# Patient Record
Sex: Female | Born: 1990 | Hispanic: Yes | Marital: Single | Smoking: Never smoker
Health system: Southern US, Community
[De-identification: ages and names within clinical notes are randomized; demographics above are authoritative.]

## PROBLEM LIST (undated history)

## (undated) ENCOUNTER — Inpatient Hospital Stay (HOSPITAL_COMMUNITY): Payer: Self-pay

## (undated) DIAGNOSIS — R7303 Prediabetes: Secondary | ICD-10-CM

## (undated) DIAGNOSIS — I471 Supraventricular tachycardia, unspecified: Secondary | ICD-10-CM

## (undated) DIAGNOSIS — I499 Cardiac arrhythmia, unspecified: Secondary | ICD-10-CM

## (undated) DIAGNOSIS — O139 Gestational [pregnancy-induced] hypertension without significant proteinuria, unspecified trimester: Secondary | ICD-10-CM

## (undated) HISTORY — DX: Prediabetes: R73.03

## (undated) HISTORY — PX: TONSILLECTOMY: SUR1361

## (undated) HISTORY — DX: Gestational (pregnancy-induced) hypertension without significant proteinuria, unspecified trimester: O13.9

---
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
## 2013-07-26 ENCOUNTER — Encounter: Payer: Self-pay | Admitting: *Deleted

## 2013-07-31 ENCOUNTER — Encounter (HOSPITAL_COMMUNITY): Payer: Self-pay | Admitting: *Deleted

## 2013-07-31 ENCOUNTER — Inpatient Hospital Stay (HOSPITAL_COMMUNITY)
Admission: AD | Admit: 2013-07-31 | Discharge: 2013-07-31 | Disposition: A | Discharge status: Home / Self Care | Payer: Medicaid Other | Source: Ambulatory Visit | Attending: Obstetrics & Gynecology | Admitting: Obstetrics & Gynecology

## 2013-07-31 ENCOUNTER — Inpatient Hospital Stay (HOSPITAL_COMMUNITY): Payer: Medicaid Other

## 2013-07-31 DIAGNOSIS — O34219 Maternal care for unspecified type scar from previous cesarean delivery: Secondary | ICD-10-CM | POA: Insufficient documentation

## 2013-07-31 DIAGNOSIS — O26899 Other specified pregnancy related conditions, unspecified trimester: Secondary | ICD-10-CM

## 2013-07-31 DIAGNOSIS — O99891 Other specified diseases and conditions complicating pregnancy: Secondary | ICD-10-CM | POA: Insufficient documentation

## 2013-07-31 DIAGNOSIS — R109 Unspecified abdominal pain: Secondary | ICD-10-CM | POA: Insufficient documentation

## 2013-07-31 DIAGNOSIS — O9989 Other specified diseases and conditions complicating pregnancy, childbirth and the puerperium: Secondary | ICD-10-CM

## 2013-07-31 LAB — URINALYSIS, ROUTINE W REFLEX MICROSCOPIC
BILIRUBIN URINE: NEGATIVE
GLUCOSE, UA: NEGATIVE mg/dL
Hgb urine dipstick: NEGATIVE
KETONES UR: NEGATIVE mg/dL
Nitrite: NEGATIVE
PH: 6 (ref 5.0–8.0)
Protein, ur: NEGATIVE mg/dL
Urobilinogen, UA: 0.2 mg/dL (ref 0.0–1.0)

## 2013-07-31 LAB — WET PREP, GENITAL
CLUE CELLS WET PREP: NONE SEEN
TRICH WET PREP: NONE SEEN
Yeast Wet Prep HPF POC: NONE SEEN

## 2013-07-31 LAB — URINE MICROSCOPIC-ADD ON

## 2013-07-31 MED ORDER — CYCLOBENZAPRINE HCL 5 MG PO TABS: 5.0000 mg | ORAL_TABLET | Freq: Three times a day (TID) | ORAL | Status: DC | PRN

## 2013-07-31 MED ORDER — CYCLOBENZAPRINE HCL 5 MG PO TABS
5.0000 mg | ORAL_TABLET | Freq: Once | ORAL | Status: AC
  Administered 2013-07-31: 5 mg via ORAL
  Filled 2013-07-31: qty 1

## 2013-07-31 NOTE — MAU Provider Note (Signed)
History     CSN: [id number]  Arrival date and time: 07/31/13 1157   First Provider Initiated Contact with Patient 07/31/13 1220      Chief Complaint  Patient presents with  . Abdominal Pain   HPI Ms. Veronica Boyle is a 23 y.o. G2P1001 at 6098w3d who presents to MAU today with lower abdominal pain since late last night. She has had some nausea today without vomiting. She is having a clear thin discharge since 10:00am today. She has not yet been seen for this pregnancy or had an US. She rates her pain at 7/10 now. She states that when the pain started it was coming and going but now it is constant. She had a previous C/S for failure to progress. She denies vaginal bleeding or fever today.    OB History   Grav Para Term Preterm Abortions TAB SAB Ect Mult Living   2 1 1  0 0 0 0 0 0 1      Past Medical History  Diagnosis Date  . Arrhythmia   . SVT (supraventricular tachycardia)     History reviewed. No pertinent past surgical history.  History reviewed. No pertinent family history.  History  Substance Use Topics  . Smoking status: Never Smoker   . Smokeless tobacco: Never Used  . Alcohol Use: No    Allergies:  Allergies  Allergen Reactions  . Vancomycin     redman syndrome    No prescriptions prior to admission    Review of Systems  Constitutional: Negative for fever and malaise/fatigue.  Gastrointestinal: Positive for nausea and abdominal pain. Negative for vomiting, diarrhea and constipation.  Genitourinary: Negative for dysuria, urgency and frequency.       Neg - vaginal bleeding + discharge  Neurological: Positive for weakness.   Physical Exam   Blood pressure 136/68, pulse 95, temperature 98.8 F (37.1 C), temperature source Oral, resp. rate 18, height 5\' 6"  (1.676 m), weight 108.863 kg (240 lb), last menstrual period 05/05/2013.  Physical Exam  Constitutional: She is oriented to person, place, and time. She appears well-developed and  well-nourished. No distress.  HENT:  Head: Normocephalic and atraumatic.  Cardiovascular: Normal rate.   Respiratory: Effort normal.  GI: Soft. She exhibits no distension and no mass. There is tenderness (moderate tenderness to palpation of the lower abdomen). There is no rebound and no guarding.  Neurological: She is alert and oriented to person, place, and time.  Skin: Skin is warm and dry. No erythema.  Psychiatric: She has a normal mood and affect.   Results for orders placed during the hospital encounter of 07/31/13 (from the past 24 hour(s))  URINALYSIS, ROUTINE W REFLEX MICROSCOPIC     Status: Abnormal   Collection Time    07/31/13 12:12 PM      Result Value Ref Range   Color, Urine YELLOW  YELLOW   APPearance HAZY (*) CLEAR   Specific Gravity, Urine >1.030 (*) 1.005 - 1.030   pH 6.0  5.0 - 8.0   Glucose, UA NEGATIVE  NEGATIVE mg/dL   Hgb urine dipstick NEGATIVE  NEGATIVE   Bilirubin Urine NEGATIVE  NEGATIVE   Ketones, ur NEGATIVE  NEGATIVE mg/dL   Protein, ur NEGATIVE  NEGATIVE mg/dL   Urobilinogen, UA 0.2  0.0 - 1.0 mg/dL   Nitrite NEGATIVE  NEGATIVE   Leukocytes, UA SMALL (*) NEGATIVE  URINE MICROSCOPIC-ADD ON     Status: Abnormal   Collection Time    07/31/13 12:12 PM  Result Value Ref Range   Squamous Epithelial / LPF MANY (*) RARE   WBC, UA 7-10  <3 WBC/hpf   RBC / HPF 0-2  <3 RBC/hpf   Bacteria, UA MANY (*) RARE   Urine-Other MUCOUS PRESENT    WET PREP, GENITAL     Status: Abnormal   Collection Time    07/31/13  2:00 PM      Result Value Ref Range   Yeast Wet Prep HPF POC NONE SEEN  NONE SEEN   Trich, Wet Prep NONE SEEN  NONE SEEN   Clue Cells Wet Prep HPF POC NONE SEEN  NONE SEEN   WBC, Wet Prep HPF POC MODERATE (*) NONE SEEN   [place] Ob Comp Less 14 Wks  07/31/2013   CLINICAL DATA:  First trimester pregnancy with low abdominal pain. LMP 05/05/2013.  EXAM: OBSTETRIC <14 WK ULTRASOUND  TECHNIQUE: Transabdominal ultrasound was performed for evaluation of the  gestation as well as the maternal uterus and adnexal regions.  COMPARISON:  None.  FINDINGS: Intrauterine gestational sac: Visualized/normal in shape.  Yolk sac:  Not clearly seen.  Embryo:  Visualized.  Cardiac Activity: Visualized.  Heart Rate: 185 bpm  CRL:   55.9     12 w 1 d                  US EDC: 02/11/2014  Maternal uterus/adnexae: Both maternal ovaries are visualized and appear normal. There is no subchorionic hematoma or free pelvic fluid.  IMPRESSION: 1. Single live intrauterine gestation with best estimated gestational age of [redacted] weeks 1 day. 2. No adnexal or uterine abnormalities identified.   Electronically Signed   By: Roxy HorsemanBill  Veazey M.D.   On: 07/31/2013 13:19    MAU Course  Procedures None  MDM Unable to obtain FHT with doppler Declines pain medication at this time US today Flexeril given in MAU today Assessment and Plan  A: SIUP at 2451w3d with normal cardiac activity Abdominal pain in pregnancy, antepartum  P: Discharge home Rx for Flexeril sent to patient's pharmacy First trimester warning signs discussed Urine culture pending Advised use of abdominal binder PRN pain  Patient encouraged to keep appointment with [facility] to start prenatal care on 08/16/13 Patient may return to MAU as needed or if her condition were to change or worsen  Freddi StarrJulie N Ethier, PA-C  07/31/2013, 3:08 PM

## 2013-07-31 NOTE — MAU Note (Signed)
Pt presents with complaints of lower abdominal pain that started last night. Denies any vaginal or abnormal discharge

## 2013-07-31 NOTE — MAU Provider Note (Signed)
Attestation of Attending Supervision of Advanced Practitioner (CNM/NP): Evaluation and management procedures were performed by the Advanced Practitioner under my supervision and collaboration. I have reviewed the Advanced Practitioner's note and chart, and I agree with the management and plan.  Fredrich RomansKelly H Timmya Blazier 4:04 PM

## 2013-07-31 NOTE — Discharge Instructions (Signed)
Abdominal Pain During Pregnancy  Belly (abdominal) pain is common during pregnancy. Most of the time, it is not a serious problem. Other times, it can be a sign that something is wrong with the pregnancy. Always tell your doctor if you have belly pain.  HOME CARE  Monitor your belly pain for any changes. The following actions may help you feel better:  · Do not have sex (intercourse) or put anything in your vagina until you feel better.  · Rest until your pain stops.  · Drink clear fluids if you feel sick to your stomach (nauseous). Do not eat solid food until you feel better.  · Only take medicine as told by your doctor.  · Keep all doctor visits as told.  GET HELP RIGHT AWAY IF:   · You are bleeding, leaking fluid, or pieces of tissue come out of your vagina.  · You have more pain or cramping.  · You keep throwing up (vomiting).  · You have pain when you pee (urinate) or have blood in your pee.  · You have a fever.  · You do not feel your baby moving as much.  · You feel very weak or feel like passing out.  · You have trouble breathing, with or without belly pain.  · You have a very bad headache and belly pain.  · You have fluid leaking from your vagina and belly pain.  · You keep having watery poop (diarrhea).  · Your belly pain does not go away after resting, or the pain gets worse.  MAKE SURE YOU:   · Understand these instructions.  · Will watch your condition.  · Will get help right away if you are not doing well or get worse.  Document Released: 03/18/2009 Document Revised: 11/30/2012 Document Reviewed: 10/27/2012  Exit[employer]® Patient Information ©2014 [employer].

## 2013-08-01 LAB — CULTURE, OB URINE
COLONY COUNT: NO GROWTH
Culture: NO GROWTH

## 2013-08-01 LAB — GC/CHLAMYDIA PROBE AMP
CT Probe RNA: NEGATIVE
GC PROBE AMP APTIMA: NEGATIVE

## 2013-08-16 ENCOUNTER — Encounter: Payer: Self-pay | Admitting: Advanced Practice Midwife

## 2013-08-16 ENCOUNTER — Telehealth: Payer: Self-pay | Admitting: *Deleted

## 2013-08-16 ENCOUNTER — Ambulatory Visit (INDEPENDENT_AMBULATORY_CARE_PROVIDER_SITE_OTHER): Payer: Self-pay | Admitting: Advanced Practice Midwife

## 2013-08-16 VITALS — BP 112/73 | HR 98 | Temp 98.3°F | Wt 250.0 lb

## 2013-08-16 DIAGNOSIS — O09299 Supervision of pregnancy with other poor reproductive or obstetric history, unspecified trimester: Secondary | ICD-10-CM

## 2013-08-16 DIAGNOSIS — E669 Obesity, unspecified: Secondary | ICD-10-CM

## 2013-08-16 DIAGNOSIS — O34219 Maternal care for unspecified type scar from previous cesarean delivery: Secondary | ICD-10-CM

## 2013-08-16 DIAGNOSIS — IMO0002 Reserved for concepts with insufficient information to code with codable children: Secondary | ICD-10-CM

## 2013-08-16 DIAGNOSIS — O99210 Obesity complicating pregnancy, unspecified trimester: Secondary | ICD-10-CM

## 2013-08-16 HISTORY — DX: Supervision of pregnancy with other poor reproductive or obstetric history, unspecified trimester: O09.299

## 2013-08-16 LAB — POCT URINALYSIS DIP (DEVICE)
GLUCOSE, UA: NEGATIVE mg/dL
Ketones, ur: 15 mg/dL — AB
NITRITE: NEGATIVE
PROTEIN: NEGATIVE mg/dL
SPECIFIC GRAVITY, URINE: 1.025 (ref 1.005–1.030)
UROBILINOGEN UA: 0.2 mg/dL (ref 0.0–1.0)
pH: 7 (ref 5.0–8.0)

## 2013-08-16 NOTE — Progress Notes (Signed)
Will get MFM/Genetic counseling consult due to hx fetal abnormalities with first baby.

## 2013-08-16 NOTE — Patient Instructions (Signed)
Vaginal Birth After Cesarean Delivery Vaginal birth after cesarean delivery (VBAC) is giving birth vaginally after previously delivering a baby by a cesarean. In the past, if a woman had a cesarean delivery, all births afterwards would be done by cesarean delivery. This is no longer true. It can be safe for the mother to try a vaginal delivery after having a cesarean delivery.  It is important to discuss VBAC with your health care provider early in the pregnancy so you can understand the risks, benefits, and options. It will give you time to decide what is best in your particular case. The final decision about whether to have a VBAC or repeat cesarean delivery should be between you and your health care provider. Any changes in your health or your baby's health during your pregnancy may make it necessary to change your initial decision about VBAC.  WOMEN WHO PLAN TO HAVE A VBAC SHOULD CHECK WITH THEIR HEALTH CARE PROVIDER TO BE SURE THAT:  The previous cesarean delivery was done with a low transverse uterine cut (incision) (not a vertical classical incision).   The birth canal is big enough for the baby.   There were no other operations on the uterus.   An electronic fetal monitor (EFM) will be on at all times during labor.   An operating room will be available and ready in case an emergency cesarean delivery is needed.   A health care provider and surgical nursing staff will be available at all times during labor to be ready to do an emergency delivery cesarean if necessary.   An anesthesiologist will be present in case an emergency cesarean delivery is needed.   The nursery is prepared and has adequate personnel and necessary equipment available to care for the baby in case of an emergency cesarean delivery. BENEFITS OF VBAC  Shorter stay in the hospital.   Avoidance of risks associated with cesarean delivery, such as:  Surgical complications, such as opening of the incision or  hernia in the incision.  Injury to other organs.  Fever. This can occur if an infection develops after surgery. It can also occur as a reaction to the medicine given to make you numb during the surgery.  Less blood loss and need for blood transfusions.  Lower risk of blood clots and infection.  Shorter recovery.   Decreased risk for having to remove the uterus (hysterectomy).   Decreased risk for the placenta to completely or partially cover the opening of the uterus (placenta previa) with a future pregnancy.   Decrease risk in future labor and delivery. RISKS OF A VBAC  Tearing (rupture) of the uterus. This is occurs in less than 1% of VBACs. The risk of this happening is higher if:  Steps are taken to begin the labor process (induce labor) or stimulate or strengthen contractions (augment labor).   Medicine is used to soften (ripen) the cervix.  Having to remove the uterus (hysterectomy) if it ruptures. VBAC SHOULD NOT BE DONE IF:  The previous cesarean delivery was done with a vertical (classical) or T-shaped incision or you do not know what kind of incision was made.   You had a ruptured uterus.   You have had certain types of surgery on your uterus, such as removal of uterine fibroids. Ask your health care provider about other types of surgeries that prevent you from having a VBAC.  You have certain medical or childbirth (obstetrical) problems.   There are problems with the baby.   You   have had two previous cesarean deliveries and no vaginal deliveries. OTHER FACTS TO KNOW ABOUT VBAC:  It is safe to have an epidural anesthetic with VBAC.   It is safe to turn the baby from a breech position (attempt an external cephalic version).   It is safe to try a VBAC with twins.   VBAC may not be successful if your baby weights 8.8 lb (4 kg) or more. However, weight predictions are not always accurate and should not be used alone to decide if VBAC is right for  you.  There is an increased failure rate if the time between the cesarean delivery and VBAC is less than 19 months.   Your health care provider may advise against a VBAC if you have preeclampsia (high blood pressure, protein in the urine, and swelling of face and extremities).   VBAC is often successful if you previously gave birth vaginally.   VBAC is often successful when the labor starts spontaneously before the due date.   Delivering a baby through a VBAC is similar to having a normal spontaneous vaginal delivery. Document Released: 09/20/2006 Document Revised: 01/18/2013 Document Reviewed: 10/27/2012 Exit[employer] Patient Information 2014 [employer].  

## 2013-08-16 NOTE — Progress Notes (Signed)
C/o of difficulty sleeping.  OB panel and pap today. Early 1hr gtt today; labs to be drawn at 1052.  New OB packet given.  Discussed appropriate weight gain (11-20lb); pt. Verbalized understanding.

## 2013-08-16 NOTE — Telephone Encounter (Signed)
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             Forgot to do it with her new ob today.            Marie ------

## 2013-08-16 NOTE — Addendum Note (Signed)
Addended by: Aviva SignsWILLIAMS, Jamare Vanatta L on: 08/16/2013 12:42 PM   Modules accepted: Orders

## 2013-08-16 NOTE — Progress Notes (Signed)
New OB. See smartset   Subjective:    Veronica Boyle is a G2P1001 2172w5d being seen today for her first obstetrical visit.  Her obstetrical history is significant for obesity. Patient does intend to breast feed. Pregnancy history fully reviewed.  Patient reports no complaints.  Filed Vitals:   08/16/13 0949  BP: 112/73  Pulse: 98  Temp: 98.3 F (36.8 C)  Weight: 250 lb (113.399 kg)    HISTORY: OB History  Gravida Para Term Preterm AB SAB TAB Ectopic Multiple Living  2 1 1  0 0 0 0 0 0 1    # Outcome Date GA Lbr Len/2nd Weight Sex Delivery Anes PTL Lv  2 CUR           1 TRM 10/11/11 5039w0d  6 lb 6 oz (2.892 kg) F LTCS Spinal N Y     Past Medical History  Diagnosis Date  . Arrhythmia   . SVT (supraventricular tachycardia)    Past Surgical History  Procedure Laterality Date  . Cesarean section    . Tonsillectomy     Family History  Problem Relation Age of Onset  . Hyperlipidemia Mother   . Hypertension Mother   . Asthma Father   . Asthma Sister   . Asthma Brother      Exam    Uterus:  Fundal Height: 14 cm  Pelvic Exam:    Perineum: No Hemorrhoids, Normal Perineum   Vulva: Bartholin's, Urethra, Skene's normal   Vagina:  normal mucosa, normal discharge   pH:    Cervix: no bleeding following Pap and no cervical motion tenderness   Adnexa: no mass, fullness, tenderness   Bony Pelvis: gynecoid  System: Breast:  normal appearance, no masses or tenderness   Skin: normal coloration and turgor, no rashes    Neurologic: oriented, grossly non-focal   Extremities: normal strength, tone, and muscle mass   HEENT neck supple with midline trachea   Mouth/Teeth mucous membranes moist, pharynx normal without lesions   Neck supple and no masses   Cardiovascular: regular rate and rhythm, no murmurs or gallops   Respiratory:  appears well, vitals normal, no respiratory distress, acyanotic, normal RR, ear and throat exam is normal, neck free of mass or lymphadenopathy,  chest clear, no wheezing, crepitations, rhonchi, normal symmetric air entry   Abdomen: soft, non-tender; bowel sounds normal; no masses,  no organomegaly   Urinary: urethral meatus normal      Assessment:    Pregnancy: G2P1001 Patient Active Problem List   Diagnosis Date Noted  . Obesity complicating pregnancy, childbirth, or puerperium, antepartum 08/16/2013        Plan:     Initial labs drawn. Prenatal vitamins. Problem list reviewed and updated. Genetic Screening discussed Quad Screen: declined.  Ultrasound discussed; fetal survey: requested.  Follow up in 4 weeks. 50% of 30 min visit spent on counseling and coordination of care.   Pap done   Aviva SignsMarie L Evangela Heffler 08/16/2013

## 2013-08-16 NOTE — Telephone Encounter (Signed)
Appointment made for MFM 5/14 @ 1300.  Contacted patient and informed of Genetic Counseling appt.  [place] was having to come on Tuesday to do 1 hour lab. Informed patient we could do both in same day. Pt will come 5/14 @ 1045 for 1 hour glucose and then 100 for Genetic counseling.  Will send note to front desk..Marland Kitchen

## 2013-08-22 ENCOUNTER — Other Ambulatory Visit: Payer: Self-pay

## 2013-08-24 ENCOUNTER — Other Ambulatory Visit: Payer: Medicaid Other

## 2013-08-24 ENCOUNTER — Other Ambulatory Visit: Payer: Self-pay

## 2013-08-24 ENCOUNTER — Ambulatory Visit (HOSPITAL_COMMUNITY)
Admission: RE | Admit: 2013-08-24 | Discharge: 2013-08-24 | Disposition: A | Discharge status: Home / Self Care | Payer: Self-pay | Source: Ambulatory Visit | Attending: Advanced Practice Midwife | Admitting: Advanced Practice Midwife

## 2013-08-24 ENCOUNTER — Encounter (HOSPITAL_COMMUNITY): Payer: Self-pay

## 2013-08-24 DIAGNOSIS — Z3480 Encounter for supervision of other normal pregnancy, unspecified trimester: Secondary | ICD-10-CM

## 2013-08-24 NOTE — Progress Notes (Signed)
Genetic Counseling  High-Risk Gestation Note  Appointment Date:  08/24/2013 Referred By: Seabron Spates, CNM Date of Birth:  Apr 10, 1991  Pregnancy History: G2P1001 Estimated Date of Delivery: 02/09/14 Estimated Gestational Age: 33w6dAttending: JJolyn Lent MD   I met with Ms. Veronica Generalfor genetic counseling because of a previous child with congenital renal anomaly.   We began by reviewing the family history in detail. Ms. DRauthreported that her daughter, Veronica Boyle who has a different father from the current pregnancy, was born with a kidney difference. 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 She was last seen in April and has a follow-up appointment in 9 months. We discussed that this describes the presence of two right ureters connecting the right kidney to the bladder.   We discussed that a duplicated collecting system, or duplex kidney, describes the presence of two ureters connecting the kidney to the bladder. It is estimated to occur in approximately 1% of the population and is more common in females versus males. It can occur unilaterally or bilaterally. The majority of cases are asymptomatic but can result in symptomatic obstruction, reflux and infection. We discussed that the majority of cases are sporadic. However, congenital anomalies of the kidney and urinary tract can occur as part of an underlying syndrome. Rarely, isolated cases have been associated with familial occurrence possibly fitting with autosomal dominant or autosomal  recessive inheritance. Given the reported family history, recurrence risk for the current pregnancy is likely low, but would be increased above the Boyle population risk. We discussed the option of detailed ultrasound at approximately 18-[redacted] weeks gestation to assess fetal anatomy, specifically kidneys and urinary tract. She understands that genetic testing or screening would not be informative regarding this history, given that no genetic etiology has been identified for her daughter. Targeted [facility] in [facility] for [facility]13/13.  She understands that ultrasound cannot rule out all birth defects or genetic syndromes.  The family histories were otherwise found to be contributory for a nephew to the father of the pregnancy (his sister's son) with hemophilia. This individual is reportedly 21years old and has a history of bleeding and bruising easily. He reportedly requires specific medicine for treatment. Additional information about this individual was limited, [place] The father of the pregnancy reportedly does not have easy bruising or other symptoms of a bleeding disorder. Hemophilia is a genetic condition characterized by either a deficiency of the Factor VIII protein (Hemophilia A) or deficiency of Factor IX (Hemophilia B).  Factor VIII and Factor IX proteins allow the blood to clot normally.  If one of these proteins does not work as it should (caused by changes in the Factor VIII or Factor IX genes on the X chromosome), this will result in excessive and prolonged bleeding and easy bruising.  Hemophilia can be treated through injections of clotting factors as needed or to prevent bleeds from occurring, but there is no cure at this time. We reviewed that both types of hemophilia follow X-linked recessive inheritance. A female "carrier" refers to a woman with  one working copy and one nonworking copy of their gene for hemophilia.  Carriers may  have no symptoms or mild symptoms of the condition.  Each time a carrier female is pregnant, the pregnancy has a new chance for each of the following outcomes: (1) a normal female, (2) a carrier female like the mother with no symptoms to mild symptoms, (3) an unaffected female (not a carrier), or (4) a female with hemophilia. Males with hemophilia would be expected to have carrier daughters and unaffected sons. We discussed that given the reported family history and that the father of the pregnancy is reportedly unaffected, the current pregnancy would not be at increased risk for hemophilia given X-linked inheritance. In the case of a different bleeding disorder in the family that follows a different pattern of inheritance or additional symptomatic relatives, recurrence risk estimate may change.   Veronica Boyle also reported a maternal great-aunt (her maternal grandmother's sister) with a mind "like a child." She reportedly has "a lot of anger" and pulled out all of her teeth. It was unclear if the patient's relative has intellectual disability, mental illness, or both. The patient stated that there were no physical health problems, and the specific cause for her features is not known. Veronica Boyle was counseled that there are many different causes of intellectual disabilities including environmental, multifactorial, and genetic etiologies.  We discussed that a specific diagnosis for intellectual disability can be determined in approximately 50% of these individuals.  In the remaining 50% of individuals, a diagnosis may never be determined.  Regarding genetic causes, we discussed that chromosome aberrations (aneuploidy, deletions, duplications, insertions, and translocations) are responsible for a small percentage of individuals with intellectual disability.  Many individuals with chromosome aberrations have additional differences, including congenital anomalies or minor dysmorphisms.  Likewise, single gene conditions are the  underlying cause of intellectual delay in some families.  We discussed that many gene conditions have intellectual disability as a feature, but also often include other physical or medical differences.  We discussed that for the majority of cases of mental health conditions, an underlying genetic cause is not known but a combination of genetic and environmental factors (multifactorial inheritance) are suspected to contribute to their onset.  Recurrence risk for mental health conditions would be highest for more closely related relatives, in the case of multifactorial inheritance observed. We discussed that without more specific information, it is difficult to provide an accurate risk assessment.  Further genetic counseling is warranted if more information is obtained.  Veronica Boyle was provided with written information regarding cystic fibrosis (CF) including the carrier frequency and incidence in the Caucasian population, the availability of carrier testing and prenatal diagnosis if indicated.  In addition, we discussed that CF is routinely screened for as part of the  newborn screening panel.  She declined testing today.   Veronica Boyle denied exposure to environmental toxins or chemical agents. She denied the use of alcohol, tobacco or street drugs. She denied significant viral illnesses during the course of her pregnancy. Her medical and surgical histories were noncontributory.   I counseled Veronica Boyle regarding the above risks and available options.  The approximate face-to-face time with the genetic counselor was 35 minutes.  Kandra Nicolas Gildardo Griffes, MS Certified Genetic Counselor 08/24/2013

## 2013-08-25 LAB — OBSTETRIC PANEL
Antibody Screen: NEGATIVE
BASOS ABS: 0 10*3/uL (ref 0.0–0.1)
BASOS PCT: 0 % (ref 0–1)
EOS ABS: 0.3 10*3/uL (ref 0.0–0.7)
EOS PCT: 3 % (ref 0–5)
HCT: 34.7 % — ABNORMAL LOW (ref 36.0–46.0)
HEP B S AG: NEGATIVE
Hemoglobin: 12.1 g/dL (ref 12.0–15.0)
Lymphocytes Relative: 20 % (ref 12–46)
Lymphs Abs: 2 10*3/uL (ref 0.7–4.0)
MCH: 29.6 pg (ref 26.0–34.0)
MCHC: 34.9 g/dL (ref 30.0–36.0)
MCV: 84.8 fL (ref 78.0–100.0)
Monocytes Absolute: 0.4 10*3/uL (ref 0.1–1.0)
Monocytes Relative: 4 % (ref 3–12)
NEUTROS PCT: 73 % (ref 43–77)
Neutro Abs: 7.2 10*3/uL (ref 1.7–7.7)
PLATELETS: 270 10*3/uL (ref 150–400)
RBC: 4.09 MIL/uL (ref 3.87–5.11)
RDW: 12.9 % (ref 11.5–15.5)
Rh Type: POSITIVE
Rubella: 1.58 Index — ABNORMAL HIGH (ref <0.90)
WBC: 9.8 10*3/uL (ref 4.0–10.5)

## 2013-08-25 LAB — HIV ANTIBODY (ROUTINE TESTING W REFLEX): HIV: NONREACTIVE

## 2013-08-25 LAB — GLUCOSE TOLERANCE, 1 HOUR (50G) W/O FASTING: Glucose, 1 Hour GTT: 131 mg/dL (ref 70–140)

## 2013-08-28 LAB — HEMOGLOBINOPATHY EVALUATION
HEMOGLOBIN OTHER: 0 %
HGB A2 QUANT: 2.4 % (ref 2.2–3.2)
HGB A: 97.4 % (ref 96.8–97.8)
HGB F QUANT: 0.2 % (ref 0.0–2.0)
HGB S QUANTITAION: 0 %

## 2013-08-30 ENCOUNTER — Encounter: Payer: Self-pay | Admitting: Advanced Practice Midwife

## 2013-08-31 ENCOUNTER — Inpatient Hospital Stay (HOSPITAL_COMMUNITY)
Admission: AD | Admit: 2013-08-31 | Discharge: 2013-09-01 | Disposition: A | Discharge status: Home / Self Care | Payer: Medicaid Other | Source: Ambulatory Visit | Attending: Obstetrics & Gynecology | Admitting: Obstetrics & Gynecology

## 2013-08-31 ENCOUNTER — Encounter (HOSPITAL_COMMUNITY): Payer: Self-pay | Admitting: *Deleted

## 2013-08-31 ENCOUNTER — Inpatient Hospital Stay (HOSPITAL_COMMUNITY): Payer: Medicaid Other

## 2013-08-31 DIAGNOSIS — O368190 Decreased fetal movements, unspecified trimester, not applicable or unspecified: Secondary | ICD-10-CM | POA: Insufficient documentation

## 2013-08-31 DIAGNOSIS — O4412 Placenta previa with hemorrhage, second trimester: Secondary | ICD-10-CM

## 2013-08-31 DIAGNOSIS — O4410 Placenta previa with hemorrhage, unspecified trimester: Secondary | ICD-10-CM | POA: Insufficient documentation

## 2013-08-31 LAB — URINALYSIS, ROUTINE W REFLEX MICROSCOPIC
BILIRUBIN URINE: NEGATIVE
GLUCOSE, UA: NEGATIVE mg/dL
HGB URINE DIPSTICK: NEGATIVE
Ketones, ur: 15 mg/dL — AB
Leukocytes, UA: NEGATIVE
Nitrite: NEGATIVE
PROTEIN: NEGATIVE mg/dL
Specific Gravity, Urine: >1.03 — ABNORMAL HIGH (ref 1.005–1.030)
UROBILINOGEN UA: 0.2 mg/dL (ref 0.0–1.0)
pH: 6 (ref 5.0–8.0)

## 2013-08-31 NOTE — MAU Note (Signed)
PT SAYS  SHE HAD SPOTTING-  BRIGHT RED ON   Tuesday,  THEN WED- LIGHT PINK  THEN TODAY-  ALMOST NOTHING.    SHE CALLED HERE -  TALKED  TO CALL- A -  NURSE- SHE   ASKED  PT  IF SHE FELT BABY MOVE-   NO.      AT 630PM-  SHE FELT TIGHTENING , PRESSURE - VAG-    WENT TO B-ROOM-  NO RELIEF.       LAST SEX- 5-1.    GETS PNC-  DOWNSTAIRS.-   SEEN LAST ON   5-16.   NEXT APPOINTMENT - 6-3.

## 2013-08-31 NOTE — MAU Provider Note (Signed)
Chief Complaint: No chief complaint on file.   First Provider Initiated Contact with Patient 08/31/13 2201      SUBJECTIVE HPI: Veronica Boyle is a 23 y.o. G2P1001 at 5254w6d by LMP who presents with abdominal tightening, abdominal pressure and decreased fetal movement.   Intermittent abdominal tightening since 6 pm tonight. Happen at least 3 times per hour. No pain associated with tightening. Also reports suprapubic pressure that is constant since 730 pm tonight. Denies vaginal discharge or irritation. Reports bright red spotting on toilet paper on Tuesday; pink spotting on Wednesday & no bleeding today. Reports nausea but no vomiting. Denies diarrhea/constipation/urinary symptoms. Last intercourse 5/1. States has been feeling fetal movement since last week; some movements in the morning, evening and night time when lies down; reports that feels "kicks". Last felt movements yesterday. Called nursing line & was told to be evaluated for decreased fetal movement, is concerned about baby as well as the abdominal tightening/pressure she is feeling.    Past Medical History  Diagnosis Date  . Arrhythmia   . SVT (supraventricular tachycardia)    OB History  Gravida Para Term Preterm AB SAB TAB Ectopic Multiple Living  2 1 1  0 0 0 0 0 0 1    # Outcome Date GA Lbr Len/2nd Weight Sex Delivery Anes PTL Lv  2 CUR           1 TRM 10/11/11 1611w0d  6 lb 6 oz (2.892 kg) F LTCS Spinal N Y     Past Surgical History  Procedure Laterality Date  . Cesarean section    . Tonsillectomy     History   Social History  . Marital Status: Single    Spouse Name: N/A    Number of Children: N/A  . Years of Education: N/A   Occupational History  . Not on file.   Social History Main Topics  . Smoking status: Never Smoker   . Smokeless tobacco: Never Used  . Alcohol Use: No  . Drug Use: No  . Sexual Activity: Yes    Birth Control/ Protection: None   Other Topics Concern  . Not on file   Social  History Narrative  . No narrative on file   No current facility-administered medications on file prior to encounter.   No current outpatient prescriptions on file prior to encounter.   Allergies  Allergen Reactions  . Vancomycin Other (See Comments)    redman syndrome    ROS: Pertinent items in HPI  OBJECTIVE Blood pressure 118/62, pulse 101, temperature 98.4 F (36.9 C), temperature source Oral, resp. rate 18, height 5\' 4"  (1.626 m), weight 254 lb 2 oz (115.27 kg), last menstrual period 05/05/2013. FHTs: 154  GENERAL: Well-developed, well-nourished female in no acute distress.  ABDOMEN: Soft, non-tender NEURO: Alert and oriented   LAB RESULTS Results for orders placed during the hospital encounter of 08/31/13 (from the past 24 hour(s))  URINALYSIS, ROUTINE W REFLEX MICROSCOPIC     Status: Abnormal   Collection Time    08/31/13  9:30 PM      Result Value Ref Range   Color, Urine YELLOW  YELLOW   APPearance CLEAR  CLEAR   Specific Gravity, Urine >1.030 (*) 1.005 - 1.030   pH 6.0  5.0 - 8.0   Glucose, UA NEGATIVE  NEGATIVE mg/dL   Hgb urine dipstick NEGATIVE  NEGATIVE   Bilirubin Urine NEGATIVE  NEGATIVE   Ketones, ur 15 (*) NEGATIVE mg/dL   Protein, ur NEGATIVE  NEGATIVE mg/dL   Urobilinogen, UA 0.2  0.0 - 1.0 mg/dL   Nitrite NEGATIVE  NEGATIVE   Leukocytes, UA NEGATIVE  NEGATIVE    IMAGING   MAU COURSE Mild dehydration per urinalysis. FHT 154 per doppler Ob limited ultrasound showed cervical length of 3.08 & posterior placenta previa Pt given phenergan PO for nausea prior to discharge Ibuprofen for cramping.   ASSESSMENT 1. Placenta previa with hemorrhage in second trimester     PLAN Discharge home in stable condition F/u with clinic as scheduled for next appointment Pelvic rest reviewed with patient      Follow-up Information   Follow up with [facility]. (next scheduled appointment)    Contact information:   [address] [place]  [id number] [phone]       [facility] (As needed, If symptoms worsen)    Contact information:   [address] [id number] [place] [id number] [phone]       Medication List         ibuprofen 600 MG tablet  Commonly known as:  ADVIL,MOTRIN  Take 1 tablet (600 mg total) by mouth once. Every 6 hours x48 hours then as needed for cramping. Do not take after [redacted] weeks gestation.     prenatal multivitamin Tabs tablet  Take 1 tablet by mouth daily at 12 noon.     promethazine 25 MG tablet  Commonly known as:  PHENERGAN  Take 1 tablet (25 mg total) by mouth every 6 (six) hours as needed for nausea or vomiting.        Claudie Reveringrin B Lawrence, Student-NP 09/01/2013  12:30 AM  I was present for the exam and agree with above. Pelvic exam deferred until placentation determined, then Previa Dx'd. CL normal.  Blood type O pos.  Previa precautions. Explained that she will need C/S if previa hasn't resolved by the time of delivery.   BivalveVirginia Renae Mottley, CNM 09/01/2013 6:11 AM

## 2013-09-01 DIAGNOSIS — O4412 Placenta previa with hemorrhage, second trimester: Secondary | ICD-10-CM | POA: Diagnosis present

## 2013-09-01 DIAGNOSIS — O4410 Placenta previa with hemorrhage, unspecified trimester: Secondary | ICD-10-CM

## 2013-09-01 MED ORDER — IBUPROFEN 600 MG PO TABS
600.0000 mg | ORAL_TABLET | Freq: Once | ORAL | Status: AC
  Administered 2013-09-01: 600 mg via ORAL
  Filled 2013-09-01: qty 1

## 2013-09-01 MED ORDER — PROMETHAZINE HCL 25 MG PO TABS: 25.0000 mg | ORAL_TABLET | Freq: Four times a day (QID) | ORAL | Status: DC | PRN

## 2013-09-01 MED ORDER — PROMETHAZINE HCL 25 MG PO TABS
25.0000 mg | ORAL_TABLET | Freq: Once | ORAL | Status: AC
  Administered 2013-09-01: 25 mg via ORAL
  Filled 2013-09-01: qty 1

## 2013-09-01 MED ORDER — IBUPROFEN 600 MG PO TABS: 600.0000 mg | ORAL_TABLET | Freq: Once | ORAL | Status: DC

## 2013-09-01 NOTE — Discharge Instructions (Signed)
Placenta Previa  Placenta previa is a condition in pregnant women where the placenta implants in the lower part of the uterus. The placenta either partially or completely covers the opening to the cervix. This is a problem because the baby must pass through the cervix during delivery. There are three types of placenta previa. They include:  1. Marginal placenta previa. The placenta is near the cervix, but does not cover the opening. 2. Partial placenta previa. The placenta covers part of the cervical opening. 3. Complete placenta previa. The placenta covers the entire cervical opening.  Depending on the type of placenta previa, there is a chance the placenta may move into a normal position and no longer cover the cervix as the pregnancy progresses. It is important to keep all prenatal visits with your caregiver.  RISK FACTORS You may be more likely to develop placenta previa if you:   Are carrying more than one baby (multiples).   Have an abnormally shaped uterus.   Have scars on the lining of the uterus.   Had previous surgeries involving the uterus, such as a cesarean delivery.   Have delivered a baby previously.   Have a history of placenta previa.   Have smoked or used cocaine during pregnancy.   Are age 21 or older during pregnancy.  SYMPTOMS The main symptom of placenta previa is sudden, painless vaginal bleeding during the second half of pregnancy. The amount of bleeding can be light to very heavy. The bleeding may stop on its own, but almost always returns. Cramping, regular contractions, abdominal pain, and lower back pain can also occur with placenta previa.  DIAGNOSIS Placenta previa can be diagnosed through an ultrasound by finding where the placenta is located. The ultrasound may find placenta previa either during a routine prenatal visit or after vaginal bleeding is noticed. If you are diagnosed with placenta previa, your caregiver may avoid vaginal exams to reduce  the risk of heavy bleeding. There is a chance that placenta previa may not be diagnosed until bleeding occurs during labor.  TREATMENT Specific treatment depends on:   How much you are bleeding or if the bleeding has stopped.  How far along you are in your pregnancy.   The condition of the baby.   The location of the baby and placenta.   The type of placenta previa.  Depending on the factors above, your caregiver may recommend:   Decreased activity.   Bed rest at home or in the hospital.  Pelvic rest. This means no sex, using tampons, douching, pelvic exams, or placing anything into the vagina.  A blood transfusion to replace maternal blood loss.  A cesarean delivery if the bleeding is heavy and cannot be controlled or the placenta completely covers the cervix.  Medication to stop premature labor or mature the fetal lungs if delivery is needed before the pregnancy is full term.  WHEN SHOULD YOU SEEK IMMEDIATE MEDICAL CARE IF YOU ARE SENT HOME WITH PLACENTA PREVIA? Seek immediate medical care if you show any symptoms of placenta previa. You will need to go to the hospital to get checked immediately. Again, those symptoms are:  Sudden, painless vaginal bleeding, even a small amount.  Cramping or regular contractions.  Lower back or abdominal pain. Document Released: 03/30/2005 Document Revised: 11/30/2012 Document Reviewed: 07/01/2012 [facility] Patient Information 2014 [place], [place].    Pelvic Rest Pelvic rest is sometimes recommended for women when:   The placenta is partially or completely covering the opening of the cervix (  placenta previa).  There is bleeding between the uterine wall and the amniotic sac in the first trimester (subchorionic hemorrhage).  The cervix begins to open without labor starting (incompetent cervix, cervical insufficiency).  The labor is too early (preterm labor). HOME CARE INSTRUCTIONS  Do not have sexual intercourse, stimulation,  or an orgasm.  Do not use tampons, douche, or put anything in the vagina.  Do not lift anything over 10 pounds (4.5 kg).  Avoid strenuous activity or straining your pelvic muscles. SEEK MEDICAL CARE IF:  You have any vaginal bleeding during pregnancy. Treat this as a potential emergency.  You have cramping pain felt low in the stomach (stronger than menstrual cramps).  You notice vaginal discharge (watery, mucus, or bloody).  You have a low, dull backache.  There are regular contractions or uterine tightening. SEEK IMMEDIATE MEDICAL CARE IF: You have vaginal bleeding and have placenta previa.  Document Released: 07/25/2010 Document Revised: 06/22/2011 Document Reviewed: 07/25/2010 [facility] Patient Information 2014 [place].

## 2013-09-12 ENCOUNTER — Other Ambulatory Visit: Payer: Self-pay | Admitting: Advanced Practice Midwife

## 2013-09-12 DIAGNOSIS — Z3689 Encounter for other specified antenatal screening: Secondary | ICD-10-CM

## 2013-09-12 DIAGNOSIS — O2690 Pregnancy related conditions, unspecified, unspecified trimester: Secondary | ICD-10-CM

## 2013-09-13 ENCOUNTER — Ambulatory Visit (HOSPITAL_COMMUNITY)
Admission: RE | Admit: 2013-09-13 | Discharge: 2013-09-13 | Disposition: A | Discharge status: Home / Self Care | Payer: Medicaid Other | Source: Ambulatory Visit | Attending: Advanced Practice Midwife | Admitting: Advanced Practice Midwife

## 2013-09-13 ENCOUNTER — Ambulatory Visit (INDEPENDENT_AMBULATORY_CARE_PROVIDER_SITE_OTHER): Payer: Medicaid Other | Admitting: Advanced Practice Midwife

## 2013-09-13 VITALS — BP 122/69 | HR 98 | Wt 254.0 lb

## 2013-09-13 VITALS — BP 133/68 | HR 111 | Temp 97.6°F | Wt 254.3 lb

## 2013-09-13 DIAGNOSIS — O4410 Placenta previa with hemorrhage, unspecified trimester: Secondary | ICD-10-CM

## 2013-09-13 DIAGNOSIS — O99210 Obesity complicating pregnancy, unspecified trimester: Secondary | ICD-10-CM

## 2013-09-13 DIAGNOSIS — Z3689 Encounter for other specified antenatal screening: Secondary | ICD-10-CM

## 2013-09-13 DIAGNOSIS — O2690 Pregnancy related conditions, unspecified, unspecified trimester: Secondary | ICD-10-CM

## 2013-09-13 DIAGNOSIS — O4412 Placenta previa with hemorrhage, second trimester: Secondary | ICD-10-CM

## 2013-09-13 DIAGNOSIS — O283 Abnormal ultrasonic finding on antenatal screening of mother: Secondary | ICD-10-CM | POA: Insufficient documentation

## 2013-09-13 DIAGNOSIS — E669 Obesity, unspecified: Secondary | ICD-10-CM

## 2013-09-13 DIAGNOSIS — O289 Unspecified abnormal findings on antenatal screening of mother: Secondary | ICD-10-CM

## 2013-09-13 NOTE — Progress Notes (Signed)
Doing well.  US done today. Showed Marginal Placenta Previa and Echogenic bowel. Discussed pelvic rest and bleeding precautions. Plan repeat US for followup per MFM.

## 2013-09-13 NOTE — Patient Instructions (Signed)
Second Trimester of Pregnancy The second trimester is from week 13 through week 28, months 4 through 6. The second trimester is often a time when you feel your best. Your body has also adjusted to being pregnant, and you begin to feel better physically. Usually, morning sickness has lessened or quit completely, you may have more energy, and you may have an increase in appetite. The second trimester is also a time when the fetus is growing rapidly. At the end of the sixth month, the fetus is about 9 inches long and weighs about 1 pounds. You will likely begin to feel the baby move (quickening) between 18 and 20 weeks of the pregnancy. BODY CHANGES Your body goes through many changes during pregnancy. The changes vary from woman to woman.   Your weight will continue to increase. You will notice your lower abdomen bulging out.  You may begin to get stretch marks on your hips, abdomen, and breasts.  You may develop headaches that can be relieved by medicines approved by your caregiver.  You may urinate more often because the fetus is pressing on your bladder.  You may develop or continue to have heartburn as a result of your pregnancy.  You may develop constipation because certain hormones are causing the muscles that push waste through your intestines to slow down.  You may develop hemorrhoids or swollen, bulging veins (varicose veins).  You may have back pain because of the weight gain and pregnancy hormones relaxing your joints between the bones in your pelvis and as a result of a shift in weight and the muscles that support your balance.  Your breasts will continue to grow and be tender.  Your gums may bleed and may be sensitive to brushing and flossing.  Dark spots or blotches (chloasma, mask of pregnancy) may develop on your face. This will likely fade after the baby is born.  A dark line from your belly button to the pubic area (linea nigra) may appear. This will likely fade after the  baby is born. WHAT TO EXPECT AT YOUR PRENATAL VISITS During a routine prenatal visit:  You will be weighed to make sure you and the fetus are growing normally.  Your blood pressure will be taken.  Your abdomen will be measured to track your baby's growth.  The fetal heartbeat will be listened to.  Any test results from the previous visit will be discussed. Your caregiver may ask you:  How you are feeling.  If you are feeling the baby move.  If you have had any abnormal symptoms, such as leaking fluid, bleeding, severe headaches, or abdominal cramping.  If you have any questions. Other tests that may be performed during your second trimester include:  Blood tests that check for:  Low iron levels (anemia).  Gestational diabetes (between 24 and 28 weeks).  Rh antibodies.  Urine tests to check for infections, diabetes, or protein in the urine.  An ultrasound to confirm the proper growth and development of the baby.  An amniocentesis to check for possible genetic problems.  Fetal screens for spina bifida and Down syndrome. HOME CARE INSTRUCTIONS   Avoid all smoking, herbs, alcohol, and unprescribed drugs. These chemicals affect the formation and growth of the baby.  Follow your caregiver's instructions regarding medicine use. There are medicines that are either safe or unsafe to take during pregnancy.  Exercise only as directed by your caregiver. Experiencing uterine cramps is a good sign to stop exercising.  Continue to eat regular,   healthy meals.  Wear a good support bra for breast tenderness.  Do not use hot tubs, steam rooms, or saunas.  Wear your seat belt at all times when driving.  Avoid raw meat, uncooked cheese, cat litter boxes, and soil used by cats. These carry germs that can cause birth defects in the baby.  Take your prenatal vitamins.  Try taking a stool softener (if your caregiver approves) if you develop constipation. Eat more high-fiber foods,  such as fresh vegetables or fruit and whole grains. Drink plenty of fluids to keep your urine clear or pale yellow.  Take warm sitz baths to soothe any pain or discomfort caused by hemorrhoids. Use hemorrhoid cream if your caregiver approves.  If you develop varicose veins, wear support hose. Elevate your feet for 15 minutes, 3 4 times a day. Limit salt in your diet.  Avoid heavy lifting, wear low heel shoes, and practice good posture.  Rest with your legs elevated if you have leg cramps or low back pain.  Visit your dentist if you have not gone yet during your pregnancy. Use a soft toothbrush to brush your teeth and be gentle when you floss.  A sexual relationship may be continued unless your caregiver directs you otherwise.  Continue to go to all your prenatal visits as directed by your caregiver. SEEK MEDICAL CARE IF:   You have dizziness.  You have mild pelvic cramps, pelvic pressure, or nagging pain in the abdominal area.  You have persistent nausea, vomiting, or diarrhea.  You have a bad smelling vaginal discharge.  You have pain with urination. SEEK IMMEDIATE MEDICAL CARE IF:   You have a fever.  You are leaking fluid from your vagina.  You have spotting or bleeding from your vagina.  You have severe abdominal cramping or pain.  You have rapid weight gain or loss.  You have shortness of breath with chest pain.  You notice sudden or extreme swelling of your face, hands, ankles, feet, or legs.  You have not felt your baby move in over an hour.  You have severe headaches that do not go away with medicine.  You have vision changes. Document Released: 03/24/2001 Document Revised: 11/30/2012 Document Reviewed: 05/31/2012 [employer] Patient Information 2014 [employer].  

## 2013-09-13 NOTE — Progress Notes (Signed)
Reports intermittent lower abdominal/pelvic pain. Patient c/o of sore throat and congestion-- list of approved OTC medications given.

## 2013-09-14 LAB — POCT URINALYSIS DIP (DEVICE)
BILIRUBIN URINE: NEGATIVE
Glucose, UA: NEGATIVE mg/dL
Hgb urine dipstick: NEGATIVE
Ketones, ur: 15 mg/dL — AB
NITRITE: NEGATIVE
PH: 7 (ref 5.0–8.0)
Protein, ur: NEGATIVE mg/dL
Specific Gravity, Urine: 1.025 (ref 1.005–1.030)
Urobilinogen, UA: 0.2 mg/dL (ref 0.0–1.0)

## 2013-10-11 ENCOUNTER — Ambulatory Visit (HOSPITAL_COMMUNITY)
Admission: RE | Admit: 2013-10-11 | Discharge: 2013-10-11 | Disposition: A | Discharge status: Home / Self Care | Payer: Medicaid Other | Source: Ambulatory Visit | Attending: Advanced Practice Midwife | Admitting: Advanced Practice Midwife

## 2013-10-11 ENCOUNTER — Encounter (HOSPITAL_COMMUNITY): Payer: Self-pay

## 2013-10-11 VITALS — BP 112/69 | HR 95 | Wt 260.0 lb

## 2013-10-11 DIAGNOSIS — Z3689 Encounter for other specified antenatal screening: Secondary | ICD-10-CM | POA: Insufficient documentation

## 2013-10-11 DIAGNOSIS — O4412 Placenta previa with hemorrhage, second trimester: Secondary | ICD-10-CM

## 2013-10-11 DIAGNOSIS — O99210 Obesity complicating pregnancy, unspecified trimester: Secondary | ICD-10-CM | POA: Diagnosis not present

## 2013-10-11 DIAGNOSIS — O4410 Placenta previa with hemorrhage, unspecified trimester: Secondary | ICD-10-CM | POA: Diagnosis not present

## 2013-10-11 DIAGNOSIS — O283 Abnormal ultrasonic finding on antenatal screening of mother: Secondary | ICD-10-CM

## 2013-10-11 DIAGNOSIS — E669 Obesity, unspecified: Secondary | ICD-10-CM | POA: Diagnosis not present

## 2013-10-12 ENCOUNTER — Ambulatory Visit (INDEPENDENT_AMBULATORY_CARE_PROVIDER_SITE_OTHER): Payer: Medicaid Other | Admitting: Obstetrics & Gynecology

## 2013-10-12 VITALS — BP 127/74 | HR 97 | Wt 258.6 lb

## 2013-10-12 DIAGNOSIS — O283 Abnormal ultrasonic finding on antenatal screening of mother: Secondary | ICD-10-CM

## 2013-10-12 DIAGNOSIS — E669 Obesity, unspecified: Secondary | ICD-10-CM

## 2013-10-12 DIAGNOSIS — O289 Unspecified abnormal findings on antenatal screening of mother: Secondary | ICD-10-CM

## 2013-10-12 DIAGNOSIS — O99210 Obesity complicating pregnancy, unspecified trimester: Secondary | ICD-10-CM

## 2013-10-12 DIAGNOSIS — O99212 Obesity complicating pregnancy, second trimester: Secondary | ICD-10-CM

## 2013-10-12 LAB — POCT URINALYSIS DIP (DEVICE)
Bilirubin Urine: NEGATIVE
Glucose, UA: NEGATIVE mg/dL
Hgb urine dipstick: NEGATIVE
Ketones, ur: 40 mg/dL — AB
Nitrite: NEGATIVE
Protein, ur: NEGATIVE mg/dL
Specific Gravity, Urine: 1.025 (ref 1.005–1.030)
Urobilinogen, UA: 0.2 mg/dL (ref 0.0–1.0)
pH: 6 (ref 5.0–8.0)

## 2013-10-12 NOTE — Progress Notes (Signed)
U/S on 10/11/13 showed resolved echogenic bowel and posterior previa. No other complaints or concerns.  Routine obstetric precautions reviewed.

## 2013-10-12 NOTE — Patient Instructions (Signed)
Return to clinic for any obstetric concerns or go to MAU for evaluation  

## 2013-10-16 ENCOUNTER — Encounter: Payer: Self-pay | Admitting: Advanced Practice Midwife

## 2013-11-09 ENCOUNTER — Ambulatory Visit (INDEPENDENT_AMBULATORY_CARE_PROVIDER_SITE_OTHER): Payer: Medicaid Other | Admitting: Family

## 2013-11-09 VITALS — BP 135/72 | HR 96 | Wt 259.3 lb

## 2013-11-09 DIAGNOSIS — O34219 Maternal care for unspecified type scar from previous cesarean delivery: Secondary | ICD-10-CM

## 2013-11-09 DIAGNOSIS — O99210 Obesity complicating pregnancy, unspecified trimester: Secondary | ICD-10-CM

## 2013-11-09 DIAGNOSIS — E669 Obesity, unspecified: Secondary | ICD-10-CM

## 2013-11-09 DIAGNOSIS — O99212 Obesity complicating pregnancy, second trimester: Secondary | ICD-10-CM

## 2013-11-09 DIAGNOSIS — Z3493 Encounter for supervision of normal pregnancy, unspecified, third trimester: Secondary | ICD-10-CM

## 2013-11-09 DIAGNOSIS — Z3480 Encounter for supervision of other normal pregnancy, unspecified trimester: Secondary | ICD-10-CM

## 2013-11-09 LAB — CBC
HCT: 32.9 % — ABNORMAL LOW (ref 36.0–46.0)
Hemoglobin: 11.3 g/dL — ABNORMAL LOW (ref 12.0–15.0)
MCH: 29.8 pg (ref 26.0–34.0)
MCHC: 34.3 g/dL (ref 30.0–36.0)
MCV: 86.8 fL (ref 78.0–100.0)
Platelets: 242 10*3/uL (ref 150–400)
RBC: 3.79 MIL/uL — ABNORMAL LOW (ref 3.87–5.11)
RDW: 13.2 % (ref 11.5–15.5)
WBC: 9.2 10*3/uL (ref 4.0–10.5)

## 2013-11-09 LAB — POCT URINALYSIS DIP (DEVICE)
Bilirubin Urine: NEGATIVE
GLUCOSE, UA: NEGATIVE mg/dL
Hgb urine dipstick: NEGATIVE
Ketones, ur: NEGATIVE mg/dL
Nitrite: NEGATIVE
Protein, ur: NEGATIVE mg/dL
SPECIFIC GRAVITY, URINE: 1.02 (ref 1.005–1.030)
UROBILINOGEN UA: 0.2 mg/dL (ref 0.0–1.0)
pH: 7 (ref 5.0–8.0)

## 2013-11-09 NOTE — Progress Notes (Signed)
Reviewed TOLAC consent, signed.  Desires Mirena for family planning.  Third trimester labs today.

## 2013-11-09 NOTE — Progress Notes (Signed)
1hr gtt and labs today at 1223.

## 2013-11-10 ENCOUNTER — Encounter: Payer: Self-pay | Admitting: *Deleted

## 2013-11-10 ENCOUNTER — Encounter: Payer: Self-pay | Admitting: Family

## 2013-11-10 LAB — GLUCOSE TOLERANCE, 1 HOUR (50G) W/O FASTING: GLUCOSE 1 HOUR GTT: 132 mg/dL (ref 70–140)

## 2013-11-10 LAB — RPR

## 2013-11-10 LAB — HIV ANTIBODY (ROUTINE TESTING W REFLEX): HIV 1&2 Ab, 4th Generation: NONREACTIVE

## 2013-11-15 ENCOUNTER — Encounter (HOSPITAL_COMMUNITY): Payer: Self-pay | Admitting: *Deleted

## 2013-11-15 ENCOUNTER — Inpatient Hospital Stay (HOSPITAL_COMMUNITY)
Admission: AD | Admit: 2013-11-15 | Discharge: 2013-11-15 | Disposition: A | Discharge status: Home / Self Care | Payer: Medicaid Other | Source: Ambulatory Visit | Attending: Family Medicine | Admitting: Family Medicine

## 2013-11-15 DIAGNOSIS — O9989 Other specified diseases and conditions complicating pregnancy, childbirth and the puerperium: Secondary | ICD-10-CM

## 2013-11-15 DIAGNOSIS — I499 Cardiac arrhythmia, unspecified: Secondary | ICD-10-CM | POA: Diagnosis not present

## 2013-11-15 DIAGNOSIS — I498 Other specified cardiac arrhythmias: Secondary | ICD-10-CM | POA: Diagnosis not present

## 2013-11-15 DIAGNOSIS — R109 Unspecified abdominal pain: Secondary | ICD-10-CM | POA: Diagnosis present

## 2013-11-15 DIAGNOSIS — O99891 Other specified diseases and conditions complicating pregnancy: Secondary | ICD-10-CM | POA: Diagnosis not present

## 2013-11-15 DIAGNOSIS — O479 False labor, unspecified: Secondary | ICD-10-CM

## 2013-11-15 DIAGNOSIS — O99212 Obesity complicating pregnancy, second trimester: Secondary | ICD-10-CM

## 2013-11-15 DIAGNOSIS — O34219 Maternal care for unspecified type scar from previous cesarean delivery: Secondary | ICD-10-CM

## 2013-11-15 DIAGNOSIS — R197 Diarrhea, unspecified: Secondary | ICD-10-CM

## 2013-11-15 LAB — URINE MICROSCOPIC-ADD ON

## 2013-11-15 LAB — URINALYSIS, ROUTINE W REFLEX MICROSCOPIC
BILIRUBIN URINE: NEGATIVE
Glucose, UA: NEGATIVE mg/dL
Hgb urine dipstick: NEGATIVE
Ketones, ur: NEGATIVE mg/dL
NITRITE: NEGATIVE
PH: 7 (ref 5.0–8.0)
PROTEIN: NEGATIVE mg/dL
Specific Gravity, Urine: 1.015 (ref 1.005–1.030)
Urobilinogen, UA: 0.2 mg/dL (ref 0.0–1.0)

## 2013-11-15 NOTE — MAU Provider Note (Signed)
History     CSN: [id number]  Arrival date and time: 11/15/13 4098   First Provider Initiated Contact with Patient 11/15/13 [phone]      No chief complaint on file.  HPI  23 year old G2P1001 at [redacted]w[redacted]d gestation who presents with lower abdominal discomfort. Patient had diarrhea 24 hours ago which has now resolved. At 2pm she had a sensation of pelvic pressure and thought the baby had dropped down. She had a few contractions that seemed to become more frequent throughout the day but have now resolved. She has some lower abdominal discomfort in the bilateral inguinal regions. No leakage of fluid, no vaginal discharge/bleeding. No nausea/vomiting. No dysuria. She has a previous history of C-section but dilated to 10cm with her first pregnancy.     Past Medical History  Diagnosis Date  . Arrhythmia   . SVT (supraventricular tachycardia)     Past Surgical History  Procedure Laterality Date  . Cesarean section    . Tonsillectomy      Family History  Problem Relation Age of Onset  . Hyperlipidemia Mother   . Hypertension Mother   . Asthma Father   . Asthma Sister   . Asthma Brother   . Birth defects Daughter     right renal duplicated collecting system    History  Substance Use Topics  . Smoking status: Never Smoker   . Smokeless tobacco: Never Used  . Alcohol Use: No    Allergies:  Allergies  Allergen Reactions  . Vancomycin Other (See Comments)    redman syndrome    Prescriptions prior to admission  Medication Sig Dispense Refill  . ibuprofen (ADVIL,MOTRIN) 600 MG tablet Take 1 tablet (600 mg total) by mouth once. Every 6 hours x48 hours then as needed for cramping. Do not take after [redacted] weeks gestation.  30 tablet  0  . Prenatal Vit-Fe Fumarate-FA (PRENATAL MULTIVITAMIN) TABS tablet Take 1 tablet by mouth daily at 12 noon.      . promethazine (PHENERGAN) 25 MG tablet Take 1 tablet (25 mg total) by mouth every 6 (six) hours as needed for nausea or vomiting.  30 tablet  0     Review of Systems  Constitutional: Negative for fever and chills.  HENT: Negative for hearing loss.   Respiratory: Negative for cough and shortness of breath.   Cardiovascular: Positive for palpitations. Negative for chest pain.  Gastrointestinal: Positive for abdominal pain and diarrhea. Negative for heartburn, nausea, vomiting and constipation.  Genitourinary: Negative for dysuria and urgency.  Musculoskeletal: Negative for myalgias.  Skin: Negative for itching and rash.  Neurological: Negative for dizziness, tingling and headaches.   Physical Exam   Blood pressure 123/59, pulse 87, temperature 98.2 F (36.8 C), temperature source Oral, resp. rate 20, height 5\' 5"  (1.651 m), weight 119.296 kg (263 lb), last menstrual period 05/05/2013, SpO2 99.00%.  Physical Exam  Constitutional: She is oriented to person, place, and time. She appears well-developed and well-nourished.  24 year old female sitting up in bed appears very comfortable  Cardiovascular: Normal rate and regular rhythm.   Respiratory: Effort normal and breath sounds normal.  GI: Soft. Bowel sounds are normal. There is no tenderness. There is no rebound.  Genitourinary: Vagina normal.  Sterile speculum exam shows no cervical dilation. Cervical check shows fingertip, thick, high, medium consistency  Musculoskeletal: She exhibits no edema and no tenderness.  Neurological: She is alert and oriented to person, place, and time.  Skin: Skin is warm and dry.  MAU Course  Procedures  MDM Fetal monitoring and toco. Urinalysis for infection. Sterile exam.  Assessment and Plan  Patient has no signs of dilation, cervical exam consistent with multiparity. No contractions on monitor for now. Suspect possible dehydration after diarrhea which has now resolved possibly caused sensation of contractions. No signs of labor. FHR looks reassuring. Urine shows leuk but no other signs of infection and she is symptomatic. She is now  symptom free and eager to be able to go home. Return for any worsening contractions. Increase fluid intake and followup with prenatal provider.  Patient seen with Rosita KeaKristy Tammie Ellsworth  Stephens, Devin A 11/15/2013, 7:00 AM    I have seen and examined this patient and agree with above documentation in the resident's note.  - pt asymptomatic, comfortable at time of discharge.  Fredirick LatheKristy Tank Difiore, MD OB Fellow 11/15/2013 10:31 AM

## 2013-11-15 NOTE — MAU Note (Signed)
PT SAYS SHE GOT UP  AT 0200-  SHE FELT BABY'S HEAD DROP INTO HER PELVIS -  SHE SAT DOWN  ON SOFA- PROPPED FEET UP.   SHE FELT PRESSURE AND LOWER ABD PAIN - AND PAIN HAS CONTINUED.  SHE CALLED NURSE AT 0359-  TOLD TO COME IN.   GETS PNC-  DOWNSTAIRS.      LAST SEX-   2 WEEKENDS AGO.    IN CLINIC- ON 7-2-   SAID PLACENTA  PREVIA WAS RESOLVED-   NO VE.

## 2013-11-15 NOTE — Discharge Instructions (Signed)
Braxton Hicks Contractions °Contractions of the uterus can occur throughout pregnancy. Contractions are not always a sign that you are in labor.  °WHAT ARE BRAXTON HICKS CONTRACTIONS?  °Contractions that occur before labor are called Braxton Hicks contractions, or false labor. Toward the end of pregnancy (32-34 weeks), these contractions can develop more often and may become more forceful. This is not true labor because these contractions do not result in opening (dilatation) and thinning of the cervix. They are sometimes difficult to tell apart from true labor because these contractions can be forceful and people have different pain tolerances. You should not feel embarrassed if you go to the hospital with false labor. Sometimes, the only way to tell if you are in true labor is for your health care provider to look for changes in the cervix. °If there are no prenatal problems or other health problems associated with the pregnancy, it is completely safe to be sent home with false labor and await the onset of true labor. °HOW CAN YOU TELL THE DIFFERENCE BETWEEN TRUE AND FALSE LABOR? °False Labor °· The contractions of false labor are usually shorter and not as hard as those of true labor.   °· The contractions are usually irregular.   °· The contractions are often felt in the front of the lower abdomen and in the groin.   °· The contractions may go away when you walk around or change positions while lying down.   °· The contractions get weaker and are shorter lasting as time goes on.   °· The contractions do not usually become progressively stronger, regular, and closer together as with true labor.   °True Labor °· Contractions in true labor last 30-70 seconds, become very regular, usually become more intense, and increase in frequency.   °· The contractions do not go away with walking.   °· The discomfort is usually felt in the top of the uterus and spreads to the lower abdomen and low back.   °· True labor can be  determined by your health care provider with an exam. This will show that the cervix is dilating and getting thinner.   °WHAT TO REMEMBER °· Keep up with your usual exercises and follow other instructions given by your health care provider.   °· Take medicines as directed by your health care provider.   °· Keep your regular prenatal appointments.   °· Eat and drink lightly if you think you are going into labor.   °· If Braxton Hicks contractions are making you uncomfortable:   °¨ Change your position from lying down or resting to walking, or from walking to resting.   °¨ Sit and rest in a tub of warm water.   °¨ Drink 2-3 glasses of water. Dehydration may cause these contractions.   °¨ Do slow and deep breathing several times an hour.   °WHEN SHOULD I SEEK IMMEDIATE MEDICAL CARE? °Seek immediate medical care if: °· Your contractions become stronger, more regular, and closer together.   °· You have fluid leaking or gushing from your vagina.   °· You have a fever.   °· You pass blood-tinged mucus.   °· You have vaginal bleeding.   °· You have continuous abdominal pain.   °· You have low back pain that you never had before.   °· You feel your baby's head pushing down and causing pelvic pressure.   °· Your baby is not moving as much as it used to.   °Document Released: 03/30/2005 Document Revised: 04/04/2013 Document Reviewed: 01/09/2013 °[employer]® Patient Information ©2015 [employer]. This information is not intended to replace advice given to you by your health care   provider. Make sure you discuss any questions you have with your health care provider. ° °

## 2013-11-16 NOTE — [facility] Provider Note (Signed)
Attestation of Attending Supervision of Advanced Practitioner (PA/CNM/NP): Evaluation and management procedures were performed by the Advanced Practitioner under my supervision and collaboration.  I have reviewed the Advanced Practitioner's note and chart, and I agree with the management and plan.  Blaise Palladino S, MD [facility] Attending 11/16/2013 8:40 AM   

## 2013-11-22 ENCOUNTER — Ambulatory Visit (HOSPITAL_COMMUNITY)
Admission: RE | Admit: 2013-11-22 | Discharge: 2013-11-22 | Disposition: A | Discharge status: Home / Self Care | Payer: Medicaid Other | Source: Ambulatory Visit | Attending: Advanced Practice Midwife | Admitting: Advanced Practice Midwife

## 2013-11-22 VITALS — BP 124/63 | HR 92 | Wt 265.8 lb

## 2013-11-22 DIAGNOSIS — O283 Abnormal ultrasonic finding on antenatal screening of mother: Secondary | ICD-10-CM

## 2013-11-22 DIAGNOSIS — O289 Unspecified abnormal findings on antenatal screening of mother: Secondary | ICD-10-CM | POA: Insufficient documentation

## 2013-11-22 DIAGNOSIS — Z3689 Encounter for other specified antenatal screening: Secondary | ICD-10-CM | POA: Insufficient documentation

## 2013-11-22 DIAGNOSIS — O09293 Supervision of pregnancy with other poor reproductive or obstetric history, third trimester: Secondary | ICD-10-CM

## 2013-11-22 NOTE — ED Notes (Signed)
Pt states last week she had 2 large gushes or water but "it wasn't urine or anything." Then last night she had another gush and woke up with her underwear "soaked." Pt states she did not call her doctor because she knew she was coming today and would stop by the clinic to be seen.  Denies bleeding or cramping.

## 2013-11-26 ENCOUNTER — Encounter: Payer: Self-pay | Admitting: Advanced Practice Midwife

## 2013-11-29 ENCOUNTER — Encounter: Payer: Self-pay | Admitting: Physician Assistant

## 2013-11-29 ENCOUNTER — Ambulatory Visit (INDEPENDENT_AMBULATORY_CARE_PROVIDER_SITE_OTHER): Payer: Medicaid Other | Admitting: Physician Assistant

## 2013-11-29 VITALS — BP 117/79 | HR 86 | Wt 259.9 lb

## 2013-11-29 DIAGNOSIS — Z23 Encounter for immunization: Secondary | ICD-10-CM

## 2013-11-29 DIAGNOSIS — O99210 Obesity complicating pregnancy, unspecified trimester: Secondary | ICD-10-CM

## 2013-11-29 DIAGNOSIS — E669 Obesity, unspecified: Secondary | ICD-10-CM

## 2013-11-29 DIAGNOSIS — O99213 Obesity complicating pregnancy, third trimester: Secondary | ICD-10-CM

## 2013-11-29 LAB — POCT URINALYSIS DIP (DEVICE)
BILIRUBIN URINE: NEGATIVE
Glucose, UA: NEGATIVE mg/dL
KETONES UR: 40 mg/dL — AB
NITRITE: NEGATIVE
PH: 7 (ref 5.0–8.0)
Protein, ur: NEGATIVE mg/dL
Specific Gravity, Urine: 1.015 (ref 1.005–1.030)
Urobilinogen, UA: 0.2 mg/dL (ref 0.0–1.0)

## 2013-11-29 MED ORDER — TETANUS-DIPHTH-ACELL PERTUSSIS 5-2.5-18.5 LF-MCG/0.5 IM SUSP: 0.5000 mL | Freq: Once | INTRAMUSCULAR | Status: DC

## 2013-11-29 NOTE — Progress Notes (Signed)
29 weeks, stable IUP complaining of allergic symptoms Okay to use Benadryl or Zyrtec/Claritin without decongestant Increase fluids Will have Tdap today RTC 2 weeks

## 2013-11-29 NOTE — Addendum Note (Signed)
Addended by: Kathee DeltonHILLMAN, Florina Glas L on: 11/29/2013 12:05 PM   Modules accepted: Orders

## 2013-12-15 ENCOUNTER — Ambulatory Visit (INDEPENDENT_AMBULATORY_CARE_PROVIDER_SITE_OTHER): Payer: Medicaid Other | Admitting: Physician Assistant

## 2013-12-15 VITALS — BP 125/73 | HR 100 | Wt 264.7 lb

## 2013-12-15 DIAGNOSIS — Z3480 Encounter for supervision of other normal pregnancy, unspecified trimester: Secondary | ICD-10-CM

## 2013-12-15 DIAGNOSIS — Z3493 Encounter for supervision of normal pregnancy, unspecified, third trimester: Secondary | ICD-10-CM

## 2013-12-15 LAB — POCT URINALYSIS DIP (DEVICE)
BILIRUBIN URINE: NEGATIVE
Glucose, UA: NEGATIVE mg/dL
Hgb urine dipstick: NEGATIVE
Nitrite: NEGATIVE
PH: 6 (ref 5.0–8.0)
Protein, ur: NEGATIVE mg/dL
Specific Gravity, Urine: 1.025 (ref 1.005–1.030)
Urobilinogen, UA: 0.2 mg/dL (ref 0.0–1.0)

## 2013-12-15 NOTE — Patient Instructions (Signed)
Third Trimester of Pregnancy The third trimester is from week 29 through week 42, months 7 through 9. The third trimester is a time when the fetus is growing rapidly. At the end of the ninth month, the fetus is about 20 inches in length and weighs 6-10 pounds.  BODY CHANGES Your body goes through many changes during pregnancy. The changes vary from woman to woman.   Your weight will continue to increase. You can expect to gain 25-35 pounds (11-16 kg) by the end of the pregnancy.  You may begin to get stretch marks on your hips, abdomen, and breasts.  You may urinate more often because the fetus is moving lower into your pelvis and pressing on your bladder.  You may develop or continue to have heartburn as a result of your pregnancy.  You may develop constipation because certain hormones are causing the muscles that push waste through your intestines to slow down.  You may develop hemorrhoids or swollen, bulging veins (varicose veins).  You may have pelvic pain because of the weight gain and pregnancy hormones relaxing your joints between the bones in your pelvis. Backaches may result from overexertion of the muscles supporting your posture.  You may have changes in your hair. These can include thickening of your hair, rapid growth, and changes in texture. Some women also have hair loss during or after pregnancy, or hair that feels dry or thin. Your hair will most likely return to normal after your baby is born.  Your breasts will continue to grow and be tender. A yellow discharge may leak from your breasts called colostrum.  Your belly button may stick out.  You may feel short of breath because of your expanding uterus.  You may notice the fetus "dropping," or moving lower in your abdomen.  You may have a bloody mucus discharge. This usually occurs a few days to a week before labor begins.  Your cervix becomes thin and soft (effaced) near your due date. WHAT TO EXPECT AT YOUR PRENATAL  EXAMS  You will have prenatal exams every 2 weeks until week 36. Then, you will have weekly prenatal exams. During a routine prenatal visit:  You will be weighed to make sure you and the fetus are growing normally.  Your blood pressure is taken.  Your abdomen will be measured to track your baby's growth.  The fetal heartbeat will be listened to.  Any test results from the previous visit will be discussed.  You may have a cervical check near your due date to see if you have effaced. At around 36 weeks, your caregiver will check your cervix. At the same time, your caregiver will also perform a test on the secretions of the vaginal tissue. This test is to determine if a type of bacteria, Group B streptococcus, is present. Your caregiver will explain this further. Your caregiver may ask you:  What your birth plan is.  How you are feeling.  If you are feeling the baby move.  If you have had any abnormal symptoms, such as leaking fluid, bleeding, severe headaches, or abdominal cramping.  If you have any questions. Other tests or screenings that may be performed during your third trimester include:  Blood tests that check for low iron levels (anemia).  Fetal testing to check the health, activity level, and growth of the fetus. Testing is done if you have certain medical conditions or if there are problems during the pregnancy. FALSE LABOR You may feel small, irregular contractions that   eventually go away. These are called Braxton Hicks contractions, or false labor. Contractions may last for hours, days, or even weeks before true labor sets in. If contractions come at regular intervals, intensify, or become painful, it is best to be seen by your caregiver.  SIGNS OF LABOR   Menstrual-like cramps.  Contractions that are 5 minutes apart or less.  Contractions that start on the top of the uterus and spread down to the lower abdomen and back.  A sense of increased pelvic pressure or back  pain.  A watery or bloody mucus discharge that comes from the vagina. If you have any of these signs before the 37th week of pregnancy, call your caregiver right away. You need to go to the hospital to get checked immediately. HOME CARE INSTRUCTIONS   Avoid all smoking, herbs, alcohol, and unprescribed drugs. These chemicals affect the formation and growth of the baby.  Follow your caregiver's instructions regarding medicine use. There are medicines that are either safe or unsafe to take during pregnancy.  Exercise only as directed by your caregiver. Experiencing uterine cramps is a good sign to stop exercising.  Continue to eat regular, healthy meals.  Wear a good support bra for breast tenderness.  Do not use hot tubs, steam rooms, or saunas.  Wear your seat belt at all times when driving.  Avoid raw meat, uncooked cheese, cat litter boxes, and soil used by cats. These carry germs that can cause birth defects in the baby.  Take your prenatal vitamins.  Try taking a stool softener (if your caregiver approves) if you develop constipation. Eat more high-fiber foods, such as fresh vegetables or fruit and whole grains. Drink plenty of fluids to keep your urine clear or pale yellow.  Take warm sitz baths to soothe any pain or discomfort caused by hemorrhoids. Use hemorrhoid cream if your caregiver approves.  If you develop varicose veins, wear support hose. Elevate your feet for 15 minutes, 3-4 times a day. Limit salt in your diet.  Avoid heavy lifting, wear low heal shoes, and practice good posture.  Rest a lot with your legs elevated if you have leg cramps or low back pain.  Visit your dentist if you have not gone during your pregnancy. Use a soft toothbrush to brush your teeth and be gentle when you floss.  A sexual relationship may be continued unless your caregiver directs you otherwise.  Do not travel far distances unless it is absolutely necessary and only with the approval  of your caregiver.  Take prenatal classes to understand, practice, and ask questions about the labor and delivery.  Make a trial run to the hospital.  Pack your hospital bag.  Prepare the baby's nursery.  Continue to go to all your prenatal visits as directed by your caregiver. SEEK MEDICAL CARE IF:  You are unsure if you are in labor or if your water has broken.  You have dizziness.  You have mild pelvic cramps, pelvic pressure, or nagging pain in your abdominal area.  You have persistent nausea, vomiting, or diarrhea.  You have a bad smelling vaginal discharge.  You have pain with urination. SEEK IMMEDIATE MEDICAL CARE IF:   You have a fever.  You are leaking fluid from your vagina.  You have spotting or bleeding from your vagina.  You have severe abdominal cramping or pain.  You have rapid weight loss or gain.  You have shortness of breath with chest pain.  You notice sudden or extreme swelling   of your face, hands, ankles, feet, or legs.  You have not felt your baby move in over an hour.  You have severe headaches that do not go away with medicine.  You have vision changes. Document Released: 03/24/2001 Document Revised: 04/04/2013 Document Reviewed: 05/31/2012 [employer] Patient Information 2015 [employer]. This information is not intended to replace advice given to you by your health care provider. Make sure you discuss any questions you have with your health care provider.  

## 2013-12-15 NOTE — Progress Notes (Signed)
32 weeks, Obesity.  Complaining of vaginal discharge.  Recently had a 2 day period of braxton-hicks ctx.  Now improved.  No eval at the time.   Vagina with white, homogenous discharge.  Wet prep pending.  RTC 2 weeks.  Labor precautions given.  Health diet encouraged.

## 2013-12-16 LAB — WET PREP, GENITAL
CLUE CELLS WET PREP: NONE SEEN
Trich, Wet Prep: NONE SEEN
WBC, Wet Prep HPF POC: NONE SEEN
YEAST WET PREP: NONE SEEN

## 2014-01-05 ENCOUNTER — Ambulatory Visit (INDEPENDENT_AMBULATORY_CARE_PROVIDER_SITE_OTHER): Payer: Medicaid Other | Admitting: Family Medicine

## 2014-01-05 VITALS — BP 128/64 | HR 99 | Temp 97.9°F | Wt 269.7 lb

## 2014-01-05 DIAGNOSIS — O34219 Maternal care for unspecified type scar from previous cesarean delivery: Secondary | ICD-10-CM

## 2014-01-05 DIAGNOSIS — Z23 Encounter for immunization: Secondary | ICD-10-CM

## 2014-01-05 LAB — POCT URINALYSIS DIP (DEVICE)
Bilirubin Urine: NEGATIVE
GLUCOSE, UA: NEGATIVE mg/dL
Ketones, ur: NEGATIVE mg/dL
Nitrite: NEGATIVE
PROTEIN: NEGATIVE mg/dL
Specific Gravity, Urine: 1.01 (ref 1.005–1.030)
Urobilinogen, UA: 0.2 mg/dL (ref 0.0–1.0)
pH: 7 (ref 5.0–8.0)

## 2014-01-05 NOTE — Progress Notes (Signed)
Patient without complaints.  Denies vaginal bleeding, abnormal vaginal discharge, contractions, loss of fluid.  Denies abdominal pain, headache, scotoma.  Reports good fetal activity.  Labor precautions reviewed.  Follow up in 1 weeks.  

## 2014-01-05 NOTE — Patient Instructions (Signed)
Third Trimester of Pregnancy The third trimester is from week 29 through week 42, months 7 through 9. The third trimester is a time when the fetus is growing rapidly. At the end of the ninth month, the fetus is about 20 inches in length and weighs 6-10 pounds.  BODY CHANGES Your body goes through many changes during pregnancy. The changes vary from woman to woman.   Your weight will continue to increase. You can expect to gain 25-35 pounds (11-16 kg) by the end of the pregnancy.  You may begin to get stretch marks on your hips, abdomen, and breasts.  You may urinate more often because the fetus is moving lower into your pelvis and pressing on your bladder.  You may develop or continue to have heartburn as a result of your pregnancy.  You may develop constipation because certain hormones are causing the muscles that push waste through your intestines to slow down.  You may develop hemorrhoids or swollen, bulging veins (varicose veins).  You may have pelvic pain because of the weight gain and pregnancy hormones relaxing your joints between the bones in your pelvis. Backaches may result from overexertion of the muscles supporting your posture.  You may have changes in your hair. These can include thickening of your hair, rapid growth, and changes in texture. Some women also have hair loss during or after pregnancy, or hair that feels dry or thin. Your hair will most likely return to normal after your baby is born.  Your breasts will continue to grow and be tender. A yellow discharge may leak from your breasts called colostrum.  Your belly button may stick out.  You may feel short of breath because of your expanding uterus.  You may notice the fetus "dropping," or moving lower in your abdomen.  You may have a bloody mucus discharge. This usually occurs a few days to a week before labor begins.  Your cervix becomes thin and soft (effaced) near your due date. WHAT TO EXPECT AT YOUR PRENATAL  EXAMS  You will have prenatal exams every 2 weeks until week 36. Then, you will have weekly prenatal exams. During a routine prenatal visit:  You will be weighed to make sure you and the fetus are growing normally.  Your blood pressure is taken.  Your abdomen will be measured to track your baby's growth.  The fetal heartbeat will be listened to.  Any test results from the previous visit will be discussed.  You may have a cervical check near your due date to see if you have effaced. At around 36 weeks, your caregiver will check your cervix. At the same time, your caregiver will also perform a test on the secretions of the vaginal tissue. This test is to determine if a type of bacteria, Group B streptococcus, is present. Your caregiver will explain this further. Your caregiver may ask you:  What your birth plan is.  How you are feeling.  If you are feeling the baby move.  If you have had any abnormal symptoms, such as leaking fluid, bleeding, severe headaches, or abdominal cramping.  If you have any questions. Other tests or screenings that may be performed during your third trimester include:  Blood tests that check for low iron levels (anemia).  Fetal testing to check the health, activity level, and growth of the fetus. Testing is done if you have certain medical conditions or if there are problems during the pregnancy. FALSE LABOR You may feel small, irregular contractions that   eventually go away. These are called Braxton Hicks contractions, or false labor. Contractions may last for hours, days, or even weeks before true labor sets in. If contractions come at regular intervals, intensify, or become painful, it is best to be seen by your caregiver.  SIGNS OF LABOR   Menstrual-like cramps.  Contractions that are 5 minutes apart or less.  Contractions that start on the top of the uterus and spread down to the lower abdomen and back.  A sense of increased pelvic pressure or back  pain.  A watery or bloody mucus discharge that comes from the vagina. If you have any of these signs before the 37th week of pregnancy, call your caregiver right away. You need to go to the hospital to get checked immediately. HOME CARE INSTRUCTIONS   Avoid all smoking, herbs, alcohol, and unprescribed drugs. These chemicals affect the formation and growth of the baby.  Follow your caregiver's instructions regarding medicine use. There are medicines that are either safe or unsafe to take during pregnancy.  Exercise only as directed by your caregiver. Experiencing uterine cramps is a good sign to stop exercising.  Continue to eat regular, healthy meals.  Wear a good support bra for breast tenderness.  Do not use hot tubs, steam rooms, or saunas.  Wear your seat belt at all times when driving.  Avoid raw meat, uncooked cheese, cat litter boxes, and soil used by cats. These carry germs that can cause birth defects in the baby.  Take your prenatal vitamins.  Try taking a stool softener (if your caregiver approves) if you develop constipation. Eat more high-fiber foods, such as fresh vegetables or fruit and whole grains. Drink plenty of fluids to keep your urine clear or pale yellow.  Take warm sitz baths to soothe any pain or discomfort caused by hemorrhoids. Use hemorrhoid cream if your caregiver approves.  If you develop varicose veins, wear support hose. Elevate your feet for 15 minutes, 3-4 times a day. Limit salt in your diet.  Avoid heavy lifting, wear low heal shoes, and practice good posture.  Rest a lot with your legs elevated if you have leg cramps or low back pain.  Visit your dentist if you have not gone during your pregnancy. Use a soft toothbrush to brush your teeth and be gentle when you floss.  A sexual relationship may be continued unless your caregiver directs you otherwise.  Do not travel far distances unless it is absolutely necessary and only with the approval  of your caregiver.  Take prenatal classes to understand, practice, and ask questions about the labor and delivery.  Make a trial run to the hospital.  Pack your hospital bag.  Prepare the baby's nursery.  Continue to go to all your prenatal visits as directed by your caregiver. SEEK MEDICAL CARE IF:  You are unsure if you are in labor or if your water has broken.  You have dizziness.  You have mild pelvic cramps, pelvic pressure, or nagging pain in your abdominal area.  You have persistent nausea, vomiting, or diarrhea.  You have a bad smelling vaginal discharge.  You have pain with urination. SEEK IMMEDIATE MEDICAL CARE IF:   You have a fever.  You are leaking fluid from your vagina.  You have spotting or bleeding from your vagina.  You have severe abdominal cramping or pain.  You have rapid weight loss or gain.  You have shortness of breath with chest pain.  You notice sudden or extreme swelling   of your face, hands, ankles, feet, or legs.  You have not felt your baby move in over an hour.  You have severe headaches that do not go away with medicine.  You have vision changes. Document Released: 03/24/2001 Document Revised: 04/04/2013 Document Reviewed: 05/31/2012 [employer] Patient Information 2015 [employer]. This information is not intended to replace advice given to you by your health care provider. Make sure you discuss any questions you have with your health care provider.  

## 2014-01-12 ENCOUNTER — Other Ambulatory Visit: Payer: Self-pay | Admitting: Obstetrics and Gynecology

## 2014-01-12 ENCOUNTER — Ambulatory Visit (INDEPENDENT_AMBULATORY_CARE_PROVIDER_SITE_OTHER): Payer: Medicaid Other | Admitting: Obstetrics and Gynecology

## 2014-01-12 ENCOUNTER — Encounter: Payer: Self-pay | Admitting: Obstetrics and Gynecology

## 2014-01-12 VITALS — BP 134/80 | HR 113 | Temp 98.3°F | Wt 272.7 lb

## 2014-01-12 DIAGNOSIS — O3421 Maternal care for scar from previous cesarean delivery: Secondary | ICD-10-CM

## 2014-01-12 DIAGNOSIS — O34219 Maternal care for unspecified type scar from previous cesarean delivery: Secondary | ICD-10-CM

## 2014-01-12 LAB — OB RESULTS CONSOLE GC/CHLAMYDIA
Chlamydia: NEGATIVE
Gonorrhea: NEGATIVE

## 2014-01-12 LAB — OB RESULTS CONSOLE GBS: STREP GROUP B AG: NEGATIVE

## 2014-01-12 NOTE — Progress Notes (Signed)
Reports headache since Monday; states tylenol helps only a couple hours; patient reports pelvic pressure and vaginal pain as well as some contractions

## 2014-01-12 NOTE — Patient Instructions (Addendum)
Third Trimester of Pregnancy  May take maximum 4 gm/24 hr acetaminophen for H/A. Call if severe or unresponsive.  The third trimester is from week 29 through week 42, months 7 through 9. The third trimester is a time when the fetus is growing rapidly. At the end of the ninth month, the fetus is about 20 inches in length and weighs 6-10 pounds.  BODY CHANGES Your body goes through many changes during pregnancy. The changes vary from woman to woman.   Your weight will continue to increase. You can expect to gain 25-35 pounds (11-16 kg) by the end of the pregnancy.  You may begin to get stretch marks on your hips, abdomen, and breasts.  You may urinate more often because the fetus is moving lower into your pelvis and pressing on your bladder.  You may develop or continue to have heartburn as a result of your pregnancy.  You may develop constipation because certain hormones are causing the muscles that push waste through your intestines to slow down.  You may develop hemorrhoids or swollen, bulging veins (varicose veins).  You may have pelvic pain because of the weight gain and pregnancy hormones relaxing your joints between the bones in your pelvis. Backaches may result from overexertion of the muscles supporting your posture.  You may have changes in your hair. These can include thickening of your hair, rapid growth, and changes in texture. Some women also have hair loss during or after pregnancy, or hair that feels dry or thin. Your hair will most likely return to normal after your baby is born.  Your breasts will continue to grow and be tender. A yellow discharge may leak from your breasts called colostrum.  Your belly button may stick out.  You may feel short of breath because of your expanding uterus.  You may notice the fetus "dropping," or moving lower in your abdomen.  You may have a bloody mucus discharge. This usually occurs a few days to a week before labor begins.  Your  cervix becomes thin and soft (effaced) near your due date. WHAT TO EXPECT AT YOUR PRENATAL EXAMS  You will have prenatal exams every 2 weeks until week 36. Then, you will have weekly prenatal exams. During a routine prenatal visit:  You will be weighed to make sure you and the fetus are growing normally.  Your blood pressure is taken.  Your abdomen will be measured to track your baby's growth.  The fetal heartbeat will be listened to.  Any test results from the previous visit will be discussed.  You may have a cervical check near your due date to see if you have effaced. At around 36 weeks, your caregiver will check your cervix. At the same time, your caregiver will also perform a test on the secretions of the vaginal tissue. This test is to determine if a type of bacteria, Group B streptococcus, is present. Your caregiver will explain this further. Your caregiver may ask you:  What your birth plan is.  How you are feeling.  If you are feeling the baby move.  If you have had any abnormal symptoms, such as leaking fluid, bleeding, severe headaches, or abdominal cramping.  If you have any questions. Other tests or screenings that may be performed during your third trimester include:  Blood tests that check for low iron levels (anemia).  Fetal testing to check the health, activity level, and growth of the fetus. Testing is done if you have certain medical conditions or  if there are problems during the pregnancy. FALSE LABOR You may feel small, irregular contractions that eventually go away. These are called Braxton Hicks contractions, or false labor. Contractions may last for hours, days, or even weeks before true labor sets in. If contractions come at regular intervals, intensify, or become painful, it is best to be seen by your caregiver.  SIGNS OF LABOR   Menstrual-like cramps.  Contractions that are 5 minutes apart or less.  Contractions that start on the top of the uterus  and spread down to the lower abdomen and back.  A sense of increased pelvic pressure or back pain.  A watery or bloody mucus discharge that comes from the vagina. If you have any of these signs before the 37th week of pregnancy, call your caregiver right away. You need to go to the hospital to get checked immediately. HOME CARE INSTRUCTIONS   Avoid all smoking, herbs, alcohol, and unprescribed drugs. These chemicals affect the formation and growth of the baby.  Follow your caregiver's instructions regarding medicine use. There are medicines that are either safe or unsafe to take during pregnancy.  Exercise only as directed by your caregiver. Experiencing uterine cramps is a good sign to stop exercising.  Continue to eat regular, healthy meals.  Wear a good support bra for breast tenderness.  Do not use hot tubs, steam rooms, or saunas.  Wear your seat belt at all times when driving.  Avoid raw meat, uncooked cheese, cat litter boxes, and soil used by cats. These carry germs that can cause birth defects in the baby.  Take your prenatal vitamins.  Try taking a stool softener (if your caregiver approves) if you develop constipation. Eat more high-fiber foods, such as fresh vegetables or fruit and whole grains. Drink plenty of fluids to keep your urine clear or pale yellow.  Take warm sitz baths to soothe any pain or discomfort caused by hemorrhoids. Use hemorrhoid cream if your caregiver approves.  If you develop varicose veins, wear support hose. Elevate your feet for 15 minutes, 3-4 times a day. Limit salt in your diet.  Avoid heavy lifting, wear low heal shoes, and practice good posture.  Rest a lot with your legs elevated if you have leg cramps or low back pain.  Visit your dentist if you have not gone during your pregnancy. Use a soft toothbrush to brush your teeth and be gentle when you floss.  A sexual relationship may be continued unless your caregiver directs you  otherwise.  Do not travel far distances unless it is absolutely necessary and only with the approval of your caregiver.  Take prenatal classes to understand, practice, and ask questions about the labor and delivery.  Make a trial run to the hospital.  Pack your hospital bag.  Prepare the baby's nursery.  Continue to go to all your prenatal visits as directed by your caregiver. SEEK MEDICAL CARE IF:  You are unsure if you are in labor or if your water has broken.  You have dizziness.  You have mild pelvic cramps, pelvic pressure, or nagging pain in your abdominal area.  You have persistent nausea, vomiting, or diarrhea.  You have a bad smelling vaginal discharge.  You have pain with urination. SEEK IMMEDIATE MEDICAL CARE IF:   You have a fever.  You are leaking fluid from your vagina.  You have spotting or bleeding from your vagina.  You have severe abdominal cramping or pain.  You have rapid weight loss or gain.  You have shortness of breath with chest pain.  You notice sudden or extreme swelling of your face, hands, ankles, feet, or legs.  You have not felt your baby move in over an hour.  You have severe headaches that do not go away with medicine.  You have vision changes. Document Released: 03/24/2001 Document Revised: 04/04/2013 Document Reviewed: 05/31/2012 [facility] Patient Information 2015 [place], [place]. This information is not intended to replace advice given to you by your health care provider. Make sure you discuss any questions you have with your health care provider. Third Trimester of Pregnancy The third trimester is from week 29 through week 42, months 7 through 9. The third trimester is a time when the fetus is growing rapidly. At the end of the ninth month, the fetus is about 20 inches in length and weighs 6-10 pounds.  BODY CHANGES Your body goes through many changes during pregnancy. The changes vary from woman to woman.   Your weight will  continue to increase. You can expect to gain 25-35 pounds (11-16 kg) by the end of the pregnancy.  You may begin to get stretch marks on your hips, abdomen, and breasts.  You may urinate more often because the fetus is moving lower into your pelvis and pressing on your bladder.  You may develop or continue to have heartburn as a result of your pregnancy.  You may develop constipation because certain hormones are causing the muscles that push waste through your intestines to slow down.  You may develop hemorrhoids or swollen, bulging veins (varicose veins).  You may have pelvic pain because of the weight gain and pregnancy hormones relaxing your joints between the bones in your pelvis. Backaches may result from overexertion of the muscles supporting your posture.  You may have changes in your hair. These can include thickening of your hair, rapid growth, and changes in texture. Some women also have hair loss during or after pregnancy, or hair that feels dry or thin. Your hair will most likely return to normal after your baby is born.  Your breasts will continue to grow and be tender. A yellow discharge may leak from your breasts called colostrum.  Your belly button may stick out.  You may feel short of breath because of your expanding uterus.  You may notice the fetus "dropping," or moving lower in your abdomen.  You may have a bloody mucus discharge. This usually occurs a few days to a week before labor begins.  Your cervix becomes thin and soft (effaced) near your due date. WHAT TO EXPECT AT YOUR PRENATAL EXAMS  You will have prenatal exams every 2 weeks until week 36. Then, you will have weekly prenatal exams. During a routine prenatal visit:  You will be weighed to make sure you and the fetus are growing normally.  Your blood pressure is taken.  Your abdomen will be measured to track your baby's growth.  The fetal heartbeat will be listened to.  Any test results from the  previous visit will be discussed.  You may have a cervical check near your due date to see if you have effaced. At around 36 weeks, your caregiver will check your cervix. At the same time, your caregiver will also perform a test on the secretions of the vaginal tissue. This test is to determine if a type of bacteria, Group B streptococcus, is present. Your caregiver will explain this further. Your caregiver may ask you:  What your birth plan is.  How you  are feeling.  If you are feeling the baby move.  If you have had any abnormal symptoms, such as leaking fluid, bleeding, severe headaches, or abdominal cramping.  If you have any questions. Other tests or screenings that may be performed during your third trimester include:  Blood tests that check for low iron levels (anemia).  Fetal testing to check the health, activity level, and growth of the fetus. Testing is done if you have certain medical conditions or if there are problems during the pregnancy. FALSE LABOR You may feel small, irregular contractions that eventually go away. These are called Braxton Hicks contractions, or false labor. Contractions may last for hours, days, or even weeks before true labor sets in. If contractions come at regular intervals, intensify, or become painful, it is best to be seen by your caregiver.  SIGNS OF LABOR   Menstrual-like cramps.  Contractions that are 5 minutes apart or less.  Contractions that start on the top of the uterus and spread down to the lower abdomen and back.  A sense of increased pelvic pressure or back pain.  A watery or bloody mucus discharge that comes from the vagina. If you have any of these signs before the 37th week of pregnancy, call your caregiver right away. You need to go to the hospital to get checked immediately. HOME CARE INSTRUCTIONS   Avoid all smoking, herbs, alcohol, and unprescribed drugs. These chemicals affect the formation and growth of the  baby.  Follow your caregiver's instructions regarding medicine use. There are medicines that are either safe or unsafe to take during pregnancy.  Exercise only as directed by your caregiver. Experiencing uterine cramps is a good sign to stop exercising.  Continue to eat regular, healthy meals.  Wear a good support bra for breast tenderness.  Do not use hot tubs, steam rooms, or saunas.  Wear your seat belt at all times when driving.  Avoid raw meat, uncooked cheese, cat litter boxes, and soil used by cats. These carry germs that can cause birth defects in the baby.  Take your prenatal vitamins.  Try taking a stool softener (if your caregiver approves) if you develop constipation. Eat more high-fiber foods, such as fresh vegetables or fruit and whole grains. Drink plenty of fluids to keep your urine clear or pale yellow.  Take warm sitz baths to soothe any pain or discomfort caused by hemorrhoids. Use hemorrhoid cream if your caregiver approves.  If you develop varicose veins, wear support hose. Elevate your feet for 15 minutes, 3-4 times a day. Limit salt in your diet.  Avoid heavy lifting, wear low heal shoes, and practice good posture.  Rest a lot with your legs elevated if you have leg cramps or low back pain.  Visit your dentist if you have not gone during your pregnancy. Use a soft toothbrush to brush your teeth and be gentle when you floss.  A sexual relationship may be continued unless your caregiver directs you otherwise.  Do not travel far distances unless it is absolutely necessary and only with the approval of your caregiver.  Take prenatal classes to understand, practice, and ask questions about the labor and delivery.  Make a trial run to the hospital.  Pack your hospital bag.  Prepare the baby's nursery.  Continue to go to all your prenatal visits as directed by your caregiver. SEEK MEDICAL CARE IF:  You are unsure if you are in labor or if your water has  broken.  You have dizziness.  You have mild pelvic cramps, pelvic pressure, or nagging pain in your abdominal area.  You have persistent nausea, vomiting, or diarrhea.  You have a bad smelling vaginal discharge.  You have pain with urination. SEEK IMMEDIATE MEDICAL CARE IF:   You have a fever.  You are leaking fluid from your vagina.  You have spotting or bleeding from your vagina.  You have severe abdominal cramping or pain.  You have rapid weight loss or gain.  You have shortness of breath with chest pain.  You notice sudden or extreme swelling of your face, hands, ankles, feet, or legs.  You have not felt your baby move in over an hour.  You have severe headaches that do not go away with medicine.  You have vision changes. Document Released: 03/24/2001 Document Revised: 04/04/2013 Document Reviewed: 05/31/2012 [facility] Patient Information 2015 [place], [place]. This information is not intended to replace advice given to you by your health care provider. Make sure you discuss any questions you have with your health care provider.

## 2014-01-12 NOTE — Progress Notes (Signed)
Discussed TOLAC (still desires, dilated 10cm with first) and her concern about her weight. Plans to breastfeed as long as possible. Headaches and max dose Tylenol discussed.

## 2014-01-13 LAB — GC/CHLAMYDIA PROBE AMP
CT PROBE, AMP APTIMA: NEGATIVE
GC Probe RNA: NEGATIVE

## 2014-01-14 ENCOUNTER — Encounter (HOSPITAL_COMMUNITY): Payer: Self-pay

## 2014-01-14 ENCOUNTER — Inpatient Hospital Stay (HOSPITAL_COMMUNITY)
Admission: AD | Admit: 2014-01-14 | Discharge: 2014-01-14 | Disposition: A | Discharge status: Home / Self Care | Payer: Medicaid Other | Source: Ambulatory Visit | Attending: Obstetrics and Gynecology | Admitting: Obstetrics and Gynecology

## 2014-01-14 DIAGNOSIS — Z3A36 36 weeks gestation of pregnancy: Secondary | ICD-10-CM | POA: Insufficient documentation

## 2014-01-14 DIAGNOSIS — O9989 Other specified diseases and conditions complicating pregnancy, childbirth and the puerperium: Secondary | ICD-10-CM | POA: Diagnosis present

## 2014-01-14 DIAGNOSIS — O368130 Decreased fetal movements, third trimester, not applicable or unspecified: Secondary | ICD-10-CM | POA: Diagnosis not present

## 2014-01-14 LAB — POCT FERN TEST: POCT Fern Test: NEGATIVE

## 2014-01-14 LAB — CULTURE, BETA STREP (GROUP B ONLY)

## 2014-01-14 NOTE — MAU Note (Signed)
Pt presents complaining of possible rupture of membranes. States she had a gush of fluid with mucous when she got out of the shower. Also complaining of decreased fetal movement since yesterday at 1500. Pt states she feels the baby moving since the monitors were applied. Denies vaginal bleeding or contractions.

## 2014-01-14 NOTE — Discharge Instructions (Signed)
Third Trimester of Pregnancy °The third trimester is from week 29 through week 42, months 7 through 9. The third trimester is a time when the fetus is growing rapidly. At the end of the ninth month, the fetus is about 20 inches in length and weighs 6-10 pounds.  °BODY CHANGES °Your body goes through many changes during pregnancy. The changes vary from woman to woman.  °· Your weight will continue to increase. You can expect to gain 25-35 pounds (11-16 kg) by the end of the pregnancy. °· You may begin to get stretch marks on your hips, abdomen, and breasts. °· You may urinate more often because the fetus is moving lower into your pelvis and pressing on your bladder. °· You may develop or continue to have heartburn as a result of your pregnancy. °· You may develop constipation because certain hormones are causing the muscles that push waste through your intestines to slow down. °· You may develop hemorrhoids or swollen, bulging veins (varicose veins). °· You may have pelvic pain because of the weight gain and pregnancy hormones relaxing your joints between the bones in your pelvis. Backaches may result from overexertion of the muscles supporting your posture. °· You may have changes in your hair. These can include thickening of your hair, rapid growth, and changes in texture. Some women also have hair loss during or after pregnancy, or hair that feels dry or thin. Your hair will most likely return to normal after your baby is born. °· Your breasts will continue to grow and be tender. A yellow discharge may leak from your breasts called colostrum. °· Your belly button may stick out. °· You may feel short of breath because of your expanding uterus. °· You may notice the fetus "dropping," or moving lower in your abdomen. °· You may have a bloody mucus discharge. This usually occurs a few days to a week before labor begins. °· Your cervix becomes thin and soft (effaced) near your due date. °WHAT TO EXPECT AT YOUR PRENATAL  EXAMS  °You will have prenatal exams every 2 weeks until week 36. Then, you will have weekly prenatal exams. During a routine prenatal visit: °· You will be weighed to make sure you and the fetus are growing normally. °· Your blood pressure is taken. °· Your abdomen will be measured to track your baby's growth. °· The fetal heartbeat will be listened to. °· Any test results from the previous visit will be discussed. °· You may have a cervical check near your due date to see if you have effaced. °At around 36 weeks, your caregiver will check your cervix. At the same time, your caregiver will also perform a test on the secretions of the vaginal tissue. This test is to determine if a type of bacteria, Group B streptococcus, is present. Your caregiver will explain this further. °Your caregiver may ask you: °· What your birth plan is. °· How you are feeling. °· If you are feeling the baby move. °· If you have had any abnormal symptoms, such as leaking fluid, bleeding, severe headaches, or abdominal cramping. °· If you have any questions. °Other tests or screenings that may be performed during your third trimester include: °· Blood tests that check for low iron levels (anemia). °· Fetal testing to check the health, activity level, and growth of the fetus. Testing is done if you have certain medical conditions or if there are problems during the pregnancy. °FALSE LABOR °You may feel small, irregular contractions that   eventually go away. These are called Braxton Hicks contractions, or false labor. Contractions may last for hours, days, or even weeks before true labor sets in. If contractions come at regular intervals, intensify, or become painful, it is best to be seen by your caregiver.  °SIGNS OF LABOR  °· Menstrual-like cramps. °· Contractions that are 5 minutes apart or less. °· Contractions that start on the top of the uterus and spread down to the lower abdomen and back. °· A sense of increased pelvic pressure or back  pain. °· A watery or bloody mucus discharge that comes from the vagina. °If you have any of these signs before the 37th week of pregnancy, call your caregiver right away. You need to go to the hospital to get checked immediately. °HOME CARE INSTRUCTIONS  °· Avoid all smoking, herbs, alcohol, and unprescribed drugs. These chemicals affect the formation and growth of the baby. °· Follow your caregiver's instructions regarding medicine use. There are medicines that are either safe or unsafe to take during pregnancy. °· Exercise only as directed by your caregiver. Experiencing uterine cramps is a good sign to stop exercising. °· Continue to eat regular, healthy meals. °· Wear a good support bra for breast tenderness. °· Do not use hot tubs, steam rooms, or saunas. °· Wear your seat belt at all times when driving. °· Avoid raw meat, uncooked cheese, cat litter boxes, and soil used by cats. These carry germs that can cause birth defects in the baby. °· Take your prenatal vitamins. °· Try taking a stool softener (if your caregiver approves) if you develop constipation. Eat more high-fiber foods, such as fresh vegetables or fruit and whole grains. Drink plenty of fluids to keep your urine clear or pale yellow. °· Take warm sitz baths to soothe any pain or discomfort caused by hemorrhoids. Use hemorrhoid cream if your caregiver approves. °· If you develop varicose veins, wear support hose. Elevate your feet for 15 minutes, 3-4 times a day. Limit salt in your diet. °· Avoid heavy lifting, wear low heal shoes, and practice good posture. °· Rest a lot with your legs elevated if you have leg cramps or low back pain. °· Visit your dentist if you have not gone during your pregnancy. Use a soft toothbrush to brush your teeth and be gentle when you floss. °· A sexual relationship may be continued unless your caregiver directs you otherwise. °· Do not travel far distances unless it is absolutely necessary and only with the approval  of your caregiver. °· Take prenatal classes to understand, practice, and ask questions about the labor and delivery. °· Make a trial run to the hospital. °· Pack your hospital bag. °· Prepare the baby's nursery. °· Continue to go to all your prenatal visits as directed by your caregiver. °SEEK MEDICAL CARE IF: °· You are unsure if you are in labor or if your water has broken. °· You have dizziness. °· You have mild pelvic cramps, pelvic pressure, or nagging pain in your abdominal area. °· You have persistent nausea, vomiting, or diarrhea. °· You have a bad smelling vaginal discharge. °· You have pain with urination. °SEEK IMMEDIATE MEDICAL CARE IF:  °· You have a fever. °· You are leaking fluid from your vagina. °· You have spotting or bleeding from your vagina. °· You have severe abdominal cramping or pain. °· You have rapid weight loss or gain. °· You have shortness of breath with chest pain. °· You notice sudden or extreme swelling   of your face, hands, ankles, feet, or legs. °· You have not felt your baby move in over an hour. °· You have severe headaches that do not go away with medicine. °· You have vision changes. °Document Released: 03/24/2001 Document Revised: 04/04/2013 Document Reviewed: 05/31/2012 °[employer]® Patient Information ©2015 [employer]. This information is not intended to replace advice given to you by your health care provider. Make sure you discuss any questions you have with your health care provider. °Fetal Movement Counts °Patient Name: __________________________________________________ Patient Due Date: ____________________ °Performing a fetal movement count is highly recommended in high-risk pregnancies, but it is good for every pregnant woman to do. Your health care provider may ask you to start counting fetal movements at 28 weeks of the pregnancy. Fetal movements often increase: °· After eating a full meal. °· After physical activity. °· After eating or drinking something sweet or  cold. °· At rest. °Pay attention to when you feel the baby is most active. This will help you notice a pattern of your baby's sleep and wake cycles and what factors contribute to an increase in fetal movement. It is important to perform a fetal movement count at the same time each day when your baby is normally most active.  °HOW TO COUNT FETAL MOVEMENTS °1. Find a quiet and comfortable area to sit or lie down on your left side. Lying on your left side provides the best blood and oxygen circulation to your baby. °2. Write down the day and time on a sheet of paper or in a journal. °3. Start counting kicks, flutters, swishes, rolls, or jabs in a 2-hour period. You should feel at least 10 movements within 2 hours. °4. If you do not feel 10 movements in 2 hours, wait 2-3 hours and count again. Look for a change in the pattern or not enough counts in 2 hours. °SEEK MEDICAL CARE IF: °· You feel less than 10 counts in 2 hours, tried twice. °· There is no movement in over an hour. °· The pattern is changing or taking longer each day to reach 10 counts in 2 hours. °· You feel the baby is not moving as he or she usually does. °Date: ____________ Movements: ____________ Start time: ____________ Finish time: ____________  °Date: ____________ Movements: ____________ Start time: ____________ Finish time: ____________ °Date: ____________ Movements: ____________ Start time: ____________ Finish time: ____________ °Date: ____________ Movements: ____________ Start time: ____________ Finish time: ____________ °Date: ____________ Movements: ____________ Start time: ____________ Finish time: ____________ °Date: ____________ Movements: ____________ Start time: ____________ Finish time: ____________ °Date: ____________ Movements: ____________ Start time: ____________ Finish time: ____________ °Date: ____________ Movements: ____________ Start time: ____________ Finish time: ____________  °Date: ____________ Movements: ____________ Start  time: ____________ Finish time: ____________ °Date: ____________ Movements: ____________ Start time: ____________ Finish time: ____________ °Date: ____________ Movements: ____________ Start time: ____________ Finish time: ____________ °Date: ____________ Movements: ____________ Start time: ____________ Finish time: ____________ °Date: ____________ Movements: ____________ Start time: ____________ Finish time: ____________ °Date: ____________ Movements: ____________ Start time: ____________ Finish time: ____________ °Date: ____________ Movements: ____________ Start time: ____________ Finish time: ____________  °Date: ____________ Movements: ____________ Start time: ____________ Finish time: ____________ °Date: ____________ Movements: ____________ Start time: ____________ Finish time: ____________ °Date: ____________ Movements: ____________ Start time: ____________ Finish time: ____________ °Date: ____________ Movements: ____________ Start time: ____________ Finish time: ____________ °Date: ____________ Movements: ____________ Start time: ____________ Finish time: ____________ °Date: ____________ Movements: ____________ Start time: ____________ Finish time: ____________ °Date: ____________ Movements: ____________ Start time: ____________ Finish time:   ____________  Date: ____________ Movements: ____________ Start time: ____________ Doreatha Martin time: ____________ Date: ____________ Movements: ____________ Start time: ____________ Doreatha Martin time: ____________ Date: ____________ Movements: ____________ Start time: ____________ Doreatha Martin time: ____________ Date: ____________ Movements: ____________ Start time: ____________ Doreatha Martin time: ____________ Date: ____________ Movements: ____________ Start time: ____________ Doreatha Martin time: ____________ Date: ____________ Movements: ____________ Start time: ____________ Doreatha Martin time: ____________ Date: ____________ Movements: ____________ Start time: ____________ Doreatha Martin time: ____________    Date: ____________ Movements: ____________ Start time: ____________ Doreatha Martin time: ____________ Date: ____________ Movements: ____________ Start time: ____________ Doreatha Martin time: ____________ Date: ____________ Movements: ____________ Start time: ____________ Doreatha Martin time: ____________ Date: ____________ Movements: ____________ Start time: ____________ Doreatha Martin time: ____________ Date: ____________ Movements: ____________ Start time: ____________ Doreatha Martin time: ____________ Date: ____________ Movements: ____________ Start time: ____________ Doreatha Martin time: ____________ Date: ____________ Movements: ____________ Start time: ____________ Doreatha Martin time: ____________  Date: ____________ Movements: ____________ Start time: ____________ Doreatha Martin time: ____________ Date: ____________ Movements: ____________ Start time: ____________ Doreatha Martin time: ____________ Date: ____________ Movements: ____________ Start time: ____________ Doreatha Martin time: ____________ Date: ____________ Movements: ____________ Start time: ____________ Doreatha Martin time: ____________ Date: ____________ Movements: ____________ Start time: ____________ Doreatha Martin time: ____________ Date: ____________ Movements: ____________ Start time: ____________ Doreatha Martin time: ____________ Date: ____________ Movements: ____________ Start time: ____________ Doreatha Martin time: ____________  Date: ____________ Movements: ____________ Start time: ____________ Doreatha Martin time: ____________ Date: ____________ Movements: ____________ Start time: ____________ Doreatha Martin time: ____________ Date: ____________ Movements: ____________ Start time: ____________ Doreatha Martin time: ____________ Date: ____________ Movements: ____________ Start time: ____________ Doreatha Martin time: ____________ Date: ____________ Movements: ____________ Start time: ____________ Doreatha Martin time: ____________ Date: ____________ Movements: ____________ Start time: ____________ Doreatha Martin time: ____________ Date: ____________ Movements: ____________  Start time: ____________ Doreatha Martin time: ____________  Date: ____________ Movements: ____________ Start time: ____________ Doreatha Martin time: ____________ Date: ____________ Movements: ____________ Start time: ____________ Doreatha Martin time: ____________ Date: ____________ Movements: ____________ Start time: ____________ Doreatha Martin time: ____________ Date: ____________ Movements: ____________ Start time: ____________ Doreatha Martin time: ____________ Date: ____________ Movements: ____________ Start time: ____________ Doreatha Martin time: ____________ Date: ____________ Movements: ____________ Start time: ____________ Doreatha Martin time: ____________ Document Released: 04/29/2006 Document Revised: 08/14/2013 Document Reviewed: 01/25/2012 [employer] Patient Information 2015 [employer]. This information is not intended to replace advice given to you by your health care provider. Make sure you discuss any questions you have with your health care provider. Braxton Hicks Contractions Contractions of the uterus can occur throughout pregnancy. Contractions are not always a sign that you are in labor.  WHAT ARE BRAXTON HICKS CONTRACTIONS?  Contractions that occur before labor are called Braxton Hicks contractions, or false labor. Toward the end of pregnancy (32-34 weeks), these contractions can develop more often and may become more forceful. This is not true labor because these contractions do not result in opening (dilatation) and thinning of the cervix. They are sometimes difficult to tell apart from true labor because these contractions can be forceful and people have different pain tolerances. You should not feel embarrassed if you go to the hospital with false labor. Sometimes, the only way to tell if you are in true labor is for your health care provider to look for changes in the cervix. If there are no prenatal problems or other health problems associated with the pregnancy, it is completely safe to be sent home with false labor and await  the onset of true labor. HOW CAN YOU TELL THE DIFFERENCE BETWEEN TRUE AND FALSE LABOR? False Labor  The contractions of false labor are usually shorter and not as hard as those of true labor.   The  contractions are usually irregular.   °· The contractions are often felt in the front of the lower abdomen and in the groin.   °· The contractions may go away when you walk around or change positions while lying down.   °· The contractions get weaker and are shorter lasting as time goes on.   °· The contractions do not usually become progressively stronger, regular, and closer together as with true labor.   °True Labor °· Contractions in true labor last 30-70 seconds, become very regular, usually become more intense, and increase in frequency.   °· The contractions do not go away with walking.   °· The discomfort is usually felt in the top of the uterus and spreads to the lower abdomen and low back.   °· True labor can be determined by your health care provider with an exam. This will show that the cervix is dilating and getting thinner.   °WHAT TO REMEMBER °· Keep up with your usual exercises and follow other instructions given by your health care provider.   °· Take medicines as directed by your health care provider.   °· Keep your regular prenatal appointments.   °· Eat and drink lightly if you think you are going into labor.   °· If Braxton Hicks contractions are making you uncomfortable:   °¨ Change your position from lying down or resting to walking, or from walking to resting.   °¨ Sit and rest in a tub of warm water.   °¨ Drink 2-3 glasses of water. Dehydration may cause these contractions.   °¨ Do slow and deep breathing several times an hour.   °WHEN SHOULD I SEEK IMMEDIATE MEDICAL CARE? °Seek immediate medical care if: °· Your contractions become stronger, more regular, and closer together.   °· You have fluid leaking or gushing from your vagina.   °· You have a fever.   °· You pass blood-tinged mucus.    °· You have vaginal bleeding.   °· You have continuous abdominal pain.   °· You have low back pain that you never had before.   °· You feel your baby's head pushing down and causing pelvic pressure.   °· Your baby is not moving as much as it used to.   °Document Released: 03/30/2005 Document Revised: 04/04/2013 Document Reviewed: 01/09/2013 °[employer]® Patient Information ©2015 [employer]. This information is not intended to replace advice given to you by your health care provider. Make sure you discuss any questions you have with your health care provider. ° °

## 2014-01-14 NOTE — Progress Notes (Signed)
Notified of pt arrival in [facility] and complaint of possible ROM. Will come see pt

## 2014-01-15 LAB — POCT URINALYSIS DIP (DEVICE)
Bilirubin Urine: NEGATIVE
Glucose, UA: NEGATIVE mg/dL
Hgb urine dipstick: NEGATIVE
Ketones, ur: 40 mg/dL — AB
Leukocytes, UA: NEGATIVE
NITRITE: NEGATIVE
PH: 6 (ref 5.0–8.0)
Protein, ur: NEGATIVE mg/dL
SPECIFIC GRAVITY, URINE: 1.025 (ref 1.005–1.030)
Urobilinogen, UA: 0.2 mg/dL (ref 0.0–1.0)

## 2014-01-17 ENCOUNTER — Telehealth: Payer: Self-pay | Admitting: *Deleted

## 2014-01-17 NOTE — Telephone Encounter (Signed)
Patient called clinic and was transferred to me. She is complaining of a Veronica Boyle on her arms and stomach. She also complains of persistant headache. I spoke with Dr. Jolayne Pantheronstant regarding patient and she recommended that patient come to [facility] for evaluation of headache. Patient is agreeable and at the end of the conversation she also mentioned her feet are very swollen. I advised patient that it is very important for her to come directly to [facility], Patient agrees and will come to [facility].

## 2014-01-18 ENCOUNTER — Inpatient Hospital Stay (HOSPITAL_COMMUNITY)
Admission: AD | Admit: 2014-01-18 | Discharge: 2014-01-18 | Disposition: A | Discharge status: Home / Self Care | Payer: Medicaid Other | Source: Ambulatory Visit | Attending: Obstetrics and Gynecology | Admitting: Obstetrics and Gynecology

## 2014-01-18 ENCOUNTER — Encounter (HOSPITAL_COMMUNITY): Payer: Self-pay | Admitting: *Deleted

## 2014-01-18 DIAGNOSIS — R51 Headache: Secondary | ICD-10-CM | POA: Diagnosis present

## 2014-01-18 DIAGNOSIS — Z3A36 36 weeks gestation of pregnancy: Secondary | ICD-10-CM | POA: Diagnosis not present

## 2014-01-18 DIAGNOSIS — R21 Rash and other nonspecific skin eruption: Secondary | ICD-10-CM | POA: Diagnosis not present

## 2014-01-18 DIAGNOSIS — O9989 Other specified diseases and conditions complicating pregnancy, childbirth and the puerperium: Secondary | ICD-10-CM | POA: Diagnosis not present

## 2014-01-18 DIAGNOSIS — O26893 Other specified pregnancy related conditions, third trimester: Secondary | ICD-10-CM

## 2014-01-18 LAB — URINALYSIS, ROUTINE W REFLEX MICROSCOPIC
BILIRUBIN URINE: NEGATIVE
GLUCOSE, UA: NEGATIVE mg/dL
HGB URINE DIPSTICK: NEGATIVE
Ketones, ur: 15 mg/dL — AB
Nitrite: NEGATIVE
PROTEIN: NEGATIVE mg/dL
Specific Gravity, Urine: 1.02 (ref 1.005–1.030)
Urobilinogen, UA: 0.2 mg/dL (ref 0.0–1.0)
pH: 6 (ref 5.0–8.0)

## 2014-01-18 LAB — URINE MICROSCOPIC-ADD ON

## 2014-01-18 MED ORDER — BUTALBITAL-APAP-CAFFEINE 50-325-40 MG PO TABS: 2.0000 | ORAL_TABLET | Freq: Four times a day (QID) | ORAL | Status: DC | PRN

## 2014-01-18 MED ORDER — BUTALBITAL-APAP-CAFFEINE 50-325-40 MG PO TABS
2.0000 | ORAL_TABLET | Freq: Four times a day (QID) | ORAL | Status: DC | PRN
  Administered 2014-01-18: 2 via ORAL
  Filled 2014-01-18: qty 2

## 2014-01-18 NOTE — [facility] Provider Note (Signed)
Attestation of Attending Supervision of Advanced Practitioner (CNM/NP): Evaluation and management procedures were performed by the Advanced Practitioner under my supervision and collaboration.  I have reviewed the Advanced Practitioner's note and chart, and I agree with the management and plan.  HARRAWAY-SMITH, Kavan Devan 10:23 PM     

## 2014-01-18 NOTE — [facility] Note (Signed)
Pt reports she has had a rash since yesterday am, itching a lot. Also has a headache. States she has been taking Benadryl but it isn't helping. Also reports increased swelling in her feet.

## 2014-01-18 NOTE — Discharge Instructions (Signed)
General Headache Without Cause A headache is pain or discomfort felt around the head or neck area. The specific cause of a headache may not be found. There are many causes and types of headaches. A few common ones are:  Tension headaches.  Migraine headaches.  Cluster headaches.  Chronic daily headaches. HOME CARE INSTRUCTIONS   Keep all follow-up appointments with your caregiver or any specialist referral.  Only take over-the-counter or prescription medicines for pain or discomfort as directed by your caregiver.  Lie down in a dark, quiet room when you have a headache.  Keep a headache journal to find out what may trigger your migraine headaches. For example, write down:  What you eat and drink.  How much sleep you get.  Any change to your diet or medicines.  Try massage or other relaxation techniques.  Put ice packs or heat on the head and neck. Use these 3 to 4 times per day for 15 to 20 minutes each time, or as needed.  Limit stress.  Sit up straight, and do not tense your muscles.  Quit smoking if you smoke.  Limit alcohol use.  Decrease the amount of caffeine you drink, or stop drinking caffeine.  Eat and sleep on a regular schedule.  Get 7 to 9 hours of sleep, or as recommended by your caregiver.  Keep lights dim if bright lights bother you and make your headaches worse. SEEK MEDICAL CARE IF:   You have problems with the medicines you were prescribed.  Your medicines are not working.  You have a change from the usual headache.  You have nausea or vomiting. SEEK IMMEDIATE MEDICAL CARE IF:   Your headache becomes severe.  You have a fever.  You have a stiff neck.  You have loss of vision.  You have muscular weakness or loss of muscle control.  You start losing your balance or have trouble walking.  You feel faint or pass out.  You have severe symptoms that are different from your first symptoms. MAKE SURE YOU:   Understand these  instructions.  Will watch your condition.  Will get help right away if you are not doing well or get worse. Document Released: 03/30/2005 Document Revised: 06/22/2011 Document Reviewed: 04/15/2011 [facility] Patient Information 2015 [place], [place]. This information is not intended to replace advice given to you by your health care provider. Make sure you discuss any questions you have with your health care provider.  Fetal Movement Counts Patient Name: __________________________________________________ Patient Due Date: ____________________ Performing a fetal movement count is highly recommended in high-risk pregnancies, but it is good for every pregnant woman to do. Your health care provider may ask you to start counting fetal movements at 28 weeks of the pregnancy. Fetal movements often increase:  After eating a full meal.  After physical activity.  After eating or drinking something sweet or cold.  At rest. Pay attention to when you feel the baby is most active. This will help you notice a pattern of your baby's sleep and wake cycles and what factors contribute to an increase in fetal movement. It is important to perform a fetal movement count at the same time each day when your baby is normally most active.  HOW TO COUNT FETAL MOVEMENTS 1. Find a quiet and comfortable area to sit or lie down on your left side. Lying on your left side provides the best blood and oxygen circulation to your baby. 2. Write down the day and time on a sheet of  paper or in a journal. 3. Start counting kicks, flutters, swishes, rolls, or jabs in a 2-hour period. You should feel at least 10 movements within 2 hours. 4. If you do not feel 10 movements in 2 hours, wait 2-3 hours and count again. Look for a change in the pattern or not enough counts in 2 hours. SEEK MEDICAL CARE IF:  You feel less than 10 counts in 2 hours, tried twice.  There is no movement in over an hour.  The pattern is changing or taking  longer each day to reach 10 counts in 2 hours.  You feel the baby is not moving as he or she usually does. Date: ____________ Movements: ____________ Start time: ____________ Veronica MartinFinish time: ____________  Date: ____________ Movements: ____________ Start time: ____________ Veronica MartinFinish time: ____________ Date: ____________ Movements: ____________ Start time: ____________ Veronica MartinFinish time: ____________ Date: ____________ Movements: ____________ Start time: ____________ Veronica MartinFinish time: ____________ Date: ____________ Movements: ____________ Start time: ____________ Veronica MartinFinish time: ____________ Date: ____________ Movements: ____________ Start time: ____________ Veronica MartinFinish time: ____________ Date: ____________ Movements: ____________ Start time: ____________ Veronica MartinFinish time: ____________ Date: ____________ Movements: ____________ Start time: ____________ Veronica MartinFinish time: ____________  Date: ____________ Movements: ____________ Start time: ____________ Veronica MartinFinish time: ____________ Date: ____________ Movements: ____________ Start time: ____________ Veronica MartinFinish time: ____________ Date: ____________ Movements: ____________ Start time: ____________ Veronica MartinFinish time: ____________ Date: ____________ Movements: ____________ Start time: ____________ Veronica MartinFinish time: ____________ Date: ____________ Movements: ____________ Start time: ____________ Veronica MartinFinish time: ____________ Date: ____________ Movements: ____________ Start time: ____________ Veronica MartinFinish time: ____________ Date: ____________ Movements: ____________ Start time: ____________ Veronica MartinFinish time: ____________  Date: ____________ Movements: ____________ Start time: ____________ Veronica MartinFinish time: ____________ Date: ____________ Movements: ____________ Start time: ____________ Veronica MartinFinish time: ____________ Date: ____________ Movements: ____________ Start time: ____________ Veronica MartinFinish time: ____________ Date: ____________ Movements: ____________ Start time: ____________ Veronica MartinFinish time: ____________ Date: ____________ Movements:  ____________ Start time: ____________ Veronica MartinFinish time: ____________ Date: ____________ Movements: ____________ Start time: ____________ Veronica MartinFinish time: ____________ Date: ____________ Movements: ____________ Start time: ____________ Veronica MartinFinish time: ____________  Date: ____________ Movements: ____________ Start time: ____________ Veronica MartinFinish time: ____________ Date: ____________ Movements: ____________ Start time: ____________ Veronica MartinFinish time: ____________ Date: ____________ Movements: ____________ Start time: ____________ Veronica MartinFinish time: ____________ Date: ____________ Movements: ____________ Start time: ____________ Veronica MartinFinish time: ____________ Date: ____________ Movements: ____________ Start time: ____________ Veronica MartinFinish time: ____________ Date: ____________ Movements: ____________ Start time: ____________ Veronica MartinFinish time: ____________ Date: ____________ Movements: ____________ Start time: ____________ Veronica MartinFinish time: ____________  Date: ____________ Movements: ____________ Start time: ____________ Veronica MartinFinish time: ____________ Date: ____________ Movements: ____________ Start time: ____________ Veronica MartinFinish time: ____________ Date: ____________ Movements: ____________ Start time: ____________ Veronica MartinFinish time: ____________ Date: ____________ Movements: ____________ Start time: ____________ Veronica MartinFinish time: ____________ Date: ____________ Movements: ____________ Start time: ____________ Veronica MartinFinish time: ____________ Date: ____________ Movements: ____________ Start time: ____________ Veronica MartinFinish time: ____________ Date: ____________ Movements: ____________ Start time: ____________ Veronica MartinFinish time: ____________  Date: ____________ Movements: ____________ Start time: ____________ Veronica MartinFinish time: ____________ Date: ____________ Movements: ____________ Start time: ____________ Veronica MartinFinish time: ____________ Date: ____________ Movements: ____________ Start time: ____________ Veronica MartinFinish time: ____________ Date: ____________ Movements: ____________ Start time: ____________ Veronica MartinFinish  time: ____________ Date: ____________ Movements: ____________ Start time: ____________ Veronica MartinFinish time: ____________ Date: ____________ Movements: ____________ Start time: ____________ Veronica MartinFinish time: ____________ Date: ____________ Movements: ____________ Start time: ____________ Veronica MartinFinish time: ____________  Date: ____________ Movements: ____________ Start time: ____________ Veronica MartinFinish time: ____________ Date: ____________ Movements: ____________ Start time: ____________ Veronica MartinFinish time: ____________ Date: ____________ Movements: ____________ Start time: ____________ Veronica MartinFinish time: ____________ Date: ____________ Movements: ____________ Start time: ____________ Veronica MartinFinish time: ____________ Date: ____________ Movements: ____________ Start  time: ____________ Veronica Boyle time: ____________ Date: ____________ Movements: ____________ Start time: ____________ Veronica Boyle time: ____________ Date: ____________ Movements: ____________ Start time: ____________ Veronica Boyle time: ____________  Date: ____________ Movements: ____________ Start time: ____________ Veronica Boyle time: ____________ Date: ____________ Movements: ____________ Start time: ____________ Veronica Boyle time: ____________ Date: ____________ Movements: ____________ Start time: ____________ Veronica Boyle time: ____________ Date: ____________ Movements: ____________ Start time: ____________ Veronica Boyle time: ____________ Date: ____________ Movements: ____________ Start time: ____________ Veronica Boyle time: ____________ Date: ____________ Movements: ____________ Start time: ____________ Veronica Boyle time: ____________ Document Released: 04/29/2006 Document Revised: 08/14/2013 Document Reviewed: 01/25/2012 [employer] Patient Information 2015 [place]. This information is not intended to replace advice given to you by your health care provider. Make sure you discuss any questions you have with your health care provider.

## 2014-01-18 NOTE — MAU Provider Note (Signed)
History     CSN: [id number]  Arrival date and time: 01/18/14 4098   None     Chief Complaint  Patient presents with  . Rash  . Headache   HPI  Veronica Boyle is a 23 y.o. G2P1001 [redacted]w[redacted]d hispanic female at the MAU complaining of a headache and a rash. She has had a headache since last Friday. She states it is pulsating in the front of her head and pulses down to her eyes and the back of her neck. Worsening over the past few days. Running errands and exerting herself makes it worse. Rest makes it a bit better. Graded at 8/10. Dizziness also occurs episodically with the headache. The rash is itchy, started yesterday morning. She took benedryl and it didn't help. The rash started in her left forearm and spread to her right and her abdomen. Hasn't been outside and noone in the household has the rash. She denies contractions, vaginal discharge, chest pain, n/v or diarrhea. She admits to having blurry vision at times and edema in her legs  OB History   Grav Para Term Preterm Abortions TAB SAB Ect Mult Living   2 1 1  0 0 0 0 0 0 1      Past Medical History  Diagnosis Date  . Arrhythmia   . SVT (supraventricular tachycardia)     Past Surgical History  Procedure Laterality Date  . Cesarean section    . Tonsillectomy      Family History  Problem Relation Age of Onset  . Hyperlipidemia Mother   . Hypertension Mother   . Asthma Father   . Asthma Sister   . Asthma Brother   . Birth defects Daughter     right renal duplicated collecting system    History  Substance Use Topics  . Smoking status: Never Smoker   . Smokeless tobacco: Never Used  . Alcohol Use: No    Allergies:  Allergies  Allergen Reactions  . Vancomycin Other (See Comments)    redman syndrome    Prescriptions prior to admission  Medication Sig Dispense Refill  . acetaminophen (TYLENOL) 500 MG tablet Take 1,000 mg by mouth every 6 (six) hours as needed.      . diphenhydrAMINE (BENADRYL) 25 mg capsule  Take 25 mg by mouth every 6 (six) hours as needed.      . Prenatal Vit-Fe Fumarate-FA (PRENATAL MULTIVITAMIN) TABS tablet Take 1 tablet by mouth daily at 12 noon.       Results for orders placed during the hospital encounter of 01/18/14 (from the past 24 hour(s))  URINALYSIS, ROUTINE W REFLEX MICROSCOPIC     Status: Abnormal   Collection Time    01/18/14  6:40 AM      Result Value Ref Range   Color, Urine YELLOW  YELLOW   APPearance CLEAR  CLEAR   Specific Gravity, Urine 1.020  1.005 - 1.030   pH 6.0  5.0 - 8.0   Glucose, UA NEGATIVE  NEGATIVE mg/dL   Hgb urine dipstick NEGATIVE  NEGATIVE   Bilirubin Urine NEGATIVE  NEGATIVE   Ketones, ur 15 (*) NEGATIVE mg/dL   Protein, ur NEGATIVE  NEGATIVE mg/dL   Urobilinogen, UA 0.2  0.0 - 1.0 mg/dL   Nitrite NEGATIVE  NEGATIVE   Leukocytes, UA MODERATE (*) NEGATIVE  URINE MICROSCOPIC-ADD ON     Status: Abnormal   Collection Time    01/18/14  6:40 AM      Result Value Ref Range  Squamous Epithelial / LPF FEW (*) RARE   WBC, UA 11-20  <3 WBC/hpf   Bacteria, UA FEW (*) RARE     Review of Systems  Constitutional: Positive for malaise/fatigue. Negative for fever, chills and weight loss.  HENT: Negative for congestion and sore throat.   Eyes: Positive for blurred vision and pain. Negative for discharge and redness.  Respiratory: Negative for cough and shortness of breath.   Cardiovascular: Negative for chest pain and palpitations.  Gastrointestinal: Positive for heartburn. Negative for nausea, vomiting, abdominal pain, diarrhea and constipation.  Genitourinary: Positive for frequency. Negative for dysuria.  Skin: Positive for itching and rash.  Neurological: Positive for dizziness, weakness and headaches. Negative for loss of consciousness.  Psychiatric/Behavioral: Negative for depression, suicidal ideas and hallucinations.   Physical Exam   Blood pressure 136/76, pulse 91, temperature 98.3 F (36.8 C), temperature source Oral, resp.  rate 18, height 5\' 6"  (1.676 m), weight 124.739 kg (275 lb), last menstrual period 05/05/2013, SpO2 99.00%.  Physical Exam  Constitutional: She is oriented to person, place, and time. She appears well-developed and well-nourished. No distress.  HENT:  Head: Normocephalic and atraumatic.  Eyes: Conjunctivae and EOM are normal. Pupils are equal, round, and reactive to light.  Neck: Normal range of motion.  Cardiovascular: Normal rate, regular rhythm and normal heart sounds.   Respiratory: Effort normal and breath sounds normal.  GI: Soft. Bowel sounds are normal.  Musculoskeletal: Normal range of motion. She exhibits edema (LE bilaterally).  Neurological: She is alert and oriented to person, place, and time.  Skin: Skin is warm and dry. Rash noted.     Pruritic non erythematous rash     MAU Course  Procedures  MDM Pt stable during MAU stay  Assessment and Plan  A: Pregnancy induced headache Pruritic rash  P: Discharge to home with instructions to return if symptoms worsen Cortizone cream OTC for rash Fioricet 50-325-40 mg BID for headache  Rosemary HolmsHaller, Nicolas 01/18/2014, 7:08 AM   I was present for the exam and agree with above.  KalihiwaiVirginia Nakeda Lebron, CNM 01/18/2014 9:24 AM

## 2014-01-22 ENCOUNTER — Inpatient Hospital Stay (HOSPITAL_COMMUNITY)
Admission: AD | Admit: 2014-01-22 | Discharge: 2014-01-28 | DRG: 765 | Disposition: A | Discharge status: Home / Self Care | Payer: Medicaid Other | Source: Ambulatory Visit | Attending: Family Medicine | Admitting: Family Medicine

## 2014-01-22 ENCOUNTER — Encounter (HOSPITAL_COMMUNITY): Payer: Self-pay | Admitting: *Deleted

## 2014-01-22 ENCOUNTER — Ambulatory Visit (INDEPENDENT_AMBULATORY_CARE_PROVIDER_SITE_OTHER): Payer: Medicaid Other | Admitting: Physician Assistant

## 2014-01-22 VITALS — BP 146/77 | HR 93 | Wt 278.5 lb

## 2014-01-22 DIAGNOSIS — R030 Elevated blood-pressure reading, without diagnosis of hypertension: Secondary | ICD-10-CM | POA: Diagnosis present

## 2014-01-22 DIAGNOSIS — Z8249 Family history of ischemic heart disease and other diseases of the circulatory system: Secondary | ICD-10-CM

## 2014-01-22 DIAGNOSIS — Z3A37 37 weeks gestation of pregnancy: Secondary | ICD-10-CM | POA: Diagnosis present

## 2014-01-22 DIAGNOSIS — O34219 Maternal care for unspecified type scar from previous cesarean delivery: Secondary | ICD-10-CM

## 2014-01-22 DIAGNOSIS — O133 Gestational [pregnancy-induced] hypertension without significant proteinuria, third trimester: Secondary | ICD-10-CM | POA: Diagnosis present

## 2014-01-22 DIAGNOSIS — O3421 Maternal care for scar from previous cesarean delivery: Secondary | ICD-10-CM | POA: Diagnosis present

## 2014-01-22 DIAGNOSIS — O99213 Obesity complicating pregnancy, third trimester: Secondary | ICD-10-CM

## 2014-01-22 DIAGNOSIS — Z3483 Encounter for supervision of other normal pregnancy, third trimester: Secondary | ICD-10-CM

## 2014-01-22 DIAGNOSIS — O139 Gestational [pregnancy-induced] hypertension without significant proteinuria, unspecified trimester: Secondary | ICD-10-CM | POA: Diagnosis present

## 2014-01-22 DIAGNOSIS — O163 Unspecified maternal hypertension, third trimester: Secondary | ICD-10-CM | POA: Insufficient documentation

## 2014-01-22 DIAGNOSIS — Z3493 Encounter for supervision of normal pregnancy, unspecified, third trimester: Secondary | ICD-10-CM

## 2014-01-22 LAB — CBC
HCT: 33 % — ABNORMAL LOW (ref 36.0–46.0)
Hemoglobin: 11 g/dL — ABNORMAL LOW (ref 12.0–15.0)
MCH: 29.6 pg (ref 26.0–34.0)
MCHC: 33.3 g/dL (ref 30.0–36.0)
MCV: 88.7 fL (ref 78.0–100.0)
PLATELETS: 207 10*3/uL (ref 150–400)
RBC: 3.72 MIL/uL — AB (ref 3.87–5.11)
RDW: 13.8 % (ref 11.5–15.5)
WBC: 8.9 10*3/uL (ref 4.0–10.5)

## 2014-01-22 LAB — URINALYSIS, ROUTINE W REFLEX MICROSCOPIC
BILIRUBIN URINE: NEGATIVE
Glucose, UA: NEGATIVE mg/dL
Hgb urine dipstick: NEGATIVE
Ketones, ur: NEGATIVE mg/dL
NITRITE: NEGATIVE
PH: 6.5 (ref 5.0–8.0)
Protein, ur: NEGATIVE mg/dL
SPECIFIC GRAVITY, URINE: 1.025 (ref 1.005–1.030)
Urobilinogen, UA: 0.2 mg/dL (ref 0.0–1.0)

## 2014-01-22 LAB — CREATININE, SERUM
Creatinine, Ser: 0.52 mg/dL (ref 0.50–1.10)
GFR calc non Af Amer: >90 mL/min (ref >90)

## 2014-01-22 LAB — URIC ACID: URIC ACID, SERUM: 3.9 mg/dL (ref 2.4–7.0)

## 2014-01-22 LAB — URINE MICROSCOPIC-ADD ON

## 2014-01-22 LAB — POCT URINALYSIS DIP (DEVICE)
BILIRUBIN URINE: NEGATIVE
Glucose, UA: NEGATIVE mg/dL
HGB URINE DIPSTICK: NEGATIVE
KETONES UR: NEGATIVE mg/dL
Nitrite: NEGATIVE
Protein, ur: NEGATIVE mg/dL
Specific Gravity, Urine: 1.025 (ref 1.005–1.030)
Urobilinogen, UA: 0.2 mg/dL (ref 0.0–1.0)
pH: 6 (ref 5.0–8.0)

## 2014-01-22 LAB — LACTATE DEHYDROGENASE: LDH: 141 U/L (ref 94–250)

## 2014-01-22 LAB — ALT: ALT: 14 U/L (ref 0–35)

## 2014-01-22 LAB — PROTEIN / CREATININE RATIO, URINE
Creatinine, Urine: 223.97 mg/dL
PROTEIN CREATININE RATIO: 0.1 (ref 0.00–0.15)
Total Protein, Urine: 22.9 mg/dL

## 2014-01-22 LAB — AST: AST: 22 U/L (ref 0–37)

## 2014-01-22 MED ORDER — LACTATED RINGERS IV SOLN
INTRAVENOUS | Status: DC
  Administered 2014-01-22 – 2014-01-23 (×3): via INTRAVENOUS
  Administered 2014-01-24: 125 mL/h via INTRAVENOUS
  Administered 2014-01-24 – 2014-01-25 (×3): via INTRAVENOUS

## 2014-01-22 MED ORDER — OXYCODONE-ACETAMINOPHEN 5-325 MG PO TABS: 1.0000 | ORAL_TABLET | ORAL | Status: DC | PRN

## 2014-01-22 MED ORDER — LACTATED RINGERS IV SOLN: 500.0000 mL | INTRAVENOUS | Status: DC | PRN

## 2014-01-22 MED ORDER — BUTALBITAL-APAP-CAFFEINE 50-325-40 MG PO TABS
1.0000 | ORAL_TABLET | Freq: Once | ORAL | Status: AC
  Administered 2014-01-22: 1 via ORAL
  Filled 2014-01-22: qty 1

## 2014-01-22 MED ORDER — ZOLPIDEM TARTRATE 5 MG PO TABS
5.0000 mg | ORAL_TABLET | Freq: Once | ORAL | Status: AC
  Administered 2014-01-23: 5 mg via ORAL
  Filled 2014-01-22: qty 1

## 2014-01-22 MED ORDER — ACETAMINOPHEN 500 MG PO TABS
1000.0000 mg | ORAL_TABLET | Freq: Once | ORAL | Status: DC
  Filled 2014-01-22: qty 2

## 2014-01-22 MED ORDER — ACETAMINOPHEN 500 MG PO TABS
1000.0000 mg | ORAL_TABLET | Freq: Three times a day (TID) | ORAL | Status: DC | PRN
  Administered 2014-01-23 (×3): 1000 mg via ORAL
  Filled 2014-01-22 (×3): qty 2

## 2014-01-22 MED ORDER — FLEET ENEMA 7-19 GM/118ML RE ENEM: 1.0000 | ENEMA | RECTAL | Status: DC | PRN

## 2014-01-22 MED ORDER — LIDOCAINE HCL (PF) 1 % IJ SOLN: 30.0000 mL | INTRAMUSCULAR | Status: DC | PRN

## 2014-01-22 MED ORDER — OXYTOCIN 40 UNITS IN LACTATED RINGERS INFUSION - SIMPLE MED: 62.5000 mL/h | INTRAVENOUS | Status: DC

## 2014-01-22 MED ORDER — OXYTOCIN BOLUS FROM INFUSION: 500.0000 mL | INTRAVENOUS | Status: DC

## 2014-01-22 MED ORDER — CITRIC ACID-SODIUM CITRATE 334-500 MG/5ML PO SOLN
30.0000 mL | ORAL | Status: DC | PRN
  Filled 2014-01-22: qty 15

## 2014-01-22 MED ORDER — CYCLOBENZAPRINE HCL 10 MG PO TABS: 10.0000 mg | ORAL_TABLET | Freq: Three times a day (TID) | ORAL | Status: DC | PRN

## 2014-01-22 MED ORDER — ONDANSETRON HCL 4 MG/2ML IJ SOLN: 4.0000 mg | Freq: Four times a day (QID) | INTRAMUSCULAR | Status: DC | PRN

## 2014-01-22 MED ORDER — OXYCODONE-ACETAMINOPHEN 5-325 MG PO TABS: 2.0000 | ORAL_TABLET | ORAL | Status: DC | PRN

## 2014-01-22 NOTE — Progress Notes (Signed)
37 weeks, complaining of rash of both arms - worse with heat, very itchy.  Benadryl not helpful.  Avoidance of heat is helpful.   Complains of having blurry vision over the last week.  This occurs with and without headache.  It occurs twice daily and lasts less than 1 minute each time.  She has also had headaches over the last week.  It never really goes away completely.  She does not get headaches normally.  When it becomes 7-8/10, she uses Tylenol which helps it become more bearable.  She did not get fioricet because the pharmacist told her it causes seizures.   Although fioricet is acceptable, will trial flexeril.   RTC 1 week assuming no admission. Pt discussed with Dr. Marice Potterove.  Due to increase in blood pressure and onset of headache/blurry vision x 1 weeks, she advises for pt to go to MAU now.

## 2014-01-22 NOTE — H&P (Signed)
LABOR ADMISSION HISTORY AND PHYSICAL  Veronica Boyle is a 23 y.o. female G2P1001 with IUP at 4639w3d by L/12 sent from clinic for evaluation of headache in the setting of elevated blood pressure. +Frontal headache for the past week, associated with photophobia. Seen in MAU once and given prescription for Fioricet. Never filled. Seen in follow up in the clinic today and BP initially elevated 146/77 so [facility]. Denies visual changes, scotoma, chest pain, shortness of breath, right upper quadrant abdominal pain. Mild edema (this is not new). Continues to have a headache. +Fetal movement. Denies contractions, LOF, vaginal bleeding. She desires an epidural for labor pain control. She plans on breast and bottle feeding. She request Mirena IUD for birth control.  Dating: By LMP --->  Estimated Date of Delivery: 02/09/14  Prenatal History/Complications:  Past Medical History: Past Medical History  Diagnosis Date  . Arrhythmia   . SVT (supraventricular tachycardia)     Past Surgical History: Past Surgical History  Procedure Laterality Date  . Cesarean section    . Tonsillectomy      Obstetrical History: OB History   Grav Para Term Preterm Abortions TAB SAB Ect Mult Living   2 1 1  0 0 0 0 0 0 1      Gynecological History: No history of abnormal pap smears.   Social History: History   Social History  . Marital Status: Single    Spouse Name: N/A    Number of Children: N/A  . Years of Education: N/A   Social History Main Topics  . Smoking status: Never Smoker   . Smokeless tobacco: Never Used  . Alcohol Use: No  . Drug Use: No  . Sexual Activity: Yes    Birth Control/ Protection: None   Other Topics Concern  . None   Social History Narrative  . None    Family History: Family History  Problem Relation Age of Onset  . Hyperlipidemia Mother   . Hypertension Mother   . Asthma Father   . Asthma Sister   . Asthma Brother   . Birth  defects Daughter     right renal duplicated collecting system    Allergies: Allergies  Allergen Reactions  . Vancomycin Other (See Comments)    Reaction:  Red man syndrome    Prescriptions prior to admission  Medication Sig Dispense Refill  . acetaminophen (TYLENOL) 500 MG tablet Take 1,000 mg by mouth every 6 (six) hours as needed for headache.       . diphenhydrAMINE (BENADRYL) 25 mg capsule Take 25 mg by mouth at bedtime as needed for itching.       . Prenatal Vit-Fe Fumarate-FA (PRENATAL MULTIVITAMIN) TABS tablet Take 1 tablet by mouth daily.       . cyclobenzaprine (FLEXERIL) 10 MG tablet Take 1 tablet (10 mg total) by mouth every 8 (eight) hours as needed for muscle spasms.  20 tablet  0     Review of Systems   All systems reviewed and negative except as stated in HPI  Blood pressure 151/86, pulse 105, temperature 98.6 F (37 C), temperature source Oral, resp. rate 18, last menstrual period 05/05/2013, SpO2 100.00%. General appearance: alert, cooperative and no distress Lungs: normal effort, clear to auscultation bilaterally Heart: regular rate and rhythm Abdomen: soft, non-tender; bowel sounds normal Pelvic: SVE 1/25/-3 Extremities: Homans sign is negative, no sign of DVT Presentation: cephalic Fetal monitoringBaseline: 150 bpm, Variability: Good {> 6 bpm), Accelerations: Reactive and Decelerations:  Absent Uterine activity None     Prenatal labs: ABO, Rh: O/POS/-- (05/14 1538) Antibody: NEG (05/14 1538) Rubella:   RPR: NON REAC (07/30 1334)  HBsAg: NEGATIVE (05/14 1538)  HIV: NONREACTIVE (07/30 1334)  GBS:   negative 1 hr Glucola 132 Genetic screening  Declines Anatomy US 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 but resolved on follow up scans   Prenatal Transfer Tool  Maternal Diabetes: No Genetic Screening: Declined Maternal Ultrasounds/Referrals: Abnormal:  Findings:   Echogenic bowel; Resolved Fetal Ultrasounds or other Referrals:  None Maternal  Substance Abuse:  No Significant Maternal Medications:  None Significant Maternal Lab Results: Lab values include: Group B Strep negative     Results for orders placed during the hospital encounter of 01/22/14 (from the past 24 hour(s))  URINALYSIS, ROUTINE W REFLEX MICROSCOPIC   Collection Time    01/22/14  4:36 PM      Result Value Ref Range   Color, Urine YELLOW  YELLOW   APPearance HAZY (*) CLEAR   Specific Gravity, Urine 1.025  1.005 - 1.030   pH 6.5  5.0 - 8.0   Glucose, UA NEGATIVE  NEGATIVE mg/dL   Hgb urine dipstick NEGATIVE  NEGATIVE   Bilirubin Urine NEGATIVE  NEGATIVE   Ketones, ur NEGATIVE  NEGATIVE mg/dL   Protein, ur NEGATIVE  NEGATIVE mg/dL   Urobilinogen, UA 0.2  0.0 - 1.0 mg/dL   Nitrite NEGATIVE  NEGATIVE   Leukocytes, UA SMALL (*) NEGATIVE  PROTEIN / CREATININE RATIO, URINE   Collection Time    01/22/14  4:36 PM      Result Value Ref Range   Creatinine, Urine 223.97     Total Protein, Urine 22.9     Protein Creatinine Ratio 0.10  0.00 - 0.15  URINE MICROSCOPIC-ADD ON   Collection Time    01/22/14  4:36 PM      Result Value Ref Range   Squamous Epithelial / LPF MANY (*) RARE   WBC, UA 3-6  <3 WBC/hpf   Bacteria, UA FEW (*) RARE  CBC   Collection Time    01/22/14  5:02 PM      Result Value Ref Range   WBC 8.9  4.0 - 10.5 K/uL   RBC 3.72 (*) 3.87 - 5.11 MIL/uL   Hemoglobin 11.0 (*) 12.0 - 15.0 g/dL   HCT 16.133.0 (*) 09.636.0 - 04.546.0 %   MCV 88.7  78.0 - 100.0 fL   MCH 29.6  26.0 - 34.0 pg   MCHC 33.3  30.0 - 36.0 g/dL   RDW 40.913.8  81.111.5 - 91.415.5 %   Platelets 207  150 - 400 K/uL  AST   Collection Time    01/22/14  5:02 PM      Result Value Ref Range   AST 22  0 - 37 U/L  ALT   Collection Time    01/22/14  5:02 PM      Result Value Ref Range   ALT 14  0 - 35 U/L  CREATININE, SERUM   Collection Time    01/22/14  5:02 PM      Result Value Ref Range   Creatinine, Ser 0.52  0.50 - 1.10 mg/dL   GFR calc non Af Amer >90  >90 mL/min   GFR calc Af Amer  >90  >90 mL/min  URIC ACID   Collection Time    01/22/14  5:02 PM      Result Value Ref Range   Uric Acid, Serum  3.9  2.4 - 7.0 mg/dL  LACTATE DEHYDROGENASE   Collection Time    01/22/14  5:02 PM      Result Value Ref Range   LDH 141  94 - 250 U/L  POCT URINALYSIS DIP (DEVICE)   Collection Time    01/22/14  3:47 PM      Result Value Ref Range   Glucose, UA NEGATIVE  NEGATIVE mg/dL   Bilirubin Urine NEGATIVE  NEGATIVE   Ketones, ur NEGATIVE  NEGATIVE mg/dL   Specific Gravity, Urine 1.025  1.005 - 1.030   Hgb urine dipstick NEGATIVE  NEGATIVE   pH 6.0  5.0 - 8.0   Protein, ur NEGATIVE  NEGATIVE mg/dL   Urobilinogen, UA 0.2  0.0 - 1.0 mg/dL   Nitrite NEGATIVE  NEGATIVE   Leukocytes, UA MODERATE (*) NEGATIVE    Patient Active Problem List   Diagnosis Date Noted  . Elevated blood pressure affecting pregnancy in third trimester, antepartum 01/22/2014  . Placenta previa with hemorrhage in second trimester 09/01/2013  . Obesity complicating pregnancy, childbirth, or puerperium, antepartum 08/16/2013  . History of fetal abnormality in previous pregnancy, currently pregnant 08/16/2013  . Previous cesarean delivery affecting pregnancy, antepartum 08/16/2013    Assessment: ANMOL PASCHEN is a 23 y.o. G2P1001 at [redacted]w[redacted]d here for induction of labor secondary to gestational hypertension.   #Gestational hypertension: BP elevated > 140/90 on more than two occasions greater than 4 hours apart. Given gestational age > 37 weeks, delivery indicated. HELLP labs negative. UPC 0.1. Headache somewhat improved with treatment.  - Monitor blood pressures.  #Labor:Induction of labor. TOLAC. Plan for staged induction with foley bulb followed by pitocin.  #Pain: Epidural once more active.  #FWB: Category I #ID:  GBS negative #MOF: Breast #MOC:Mirena IUD #Circ:  Need to ask  William Dalton 01/22/2014, 10:26 PM

## 2014-01-22 NOTE — Progress Notes (Signed)
Patient has noticed some brown discharge when wiping.

## 2014-01-22 NOTE — MAU Note (Signed)
Patient presents to MAU having been sent from the clinic for evaluation of high blood pressure. Reports blurred vision at times as well as a persistent headache. States having an increased amount of swelling in feet. Denies epigastric pain. +FM. Denies LOF, VB, or contractions at this time.

## 2014-01-23 LAB — CBC
HEMATOCRIT: 33.4 % — AB (ref 36.0–46.0)
HEMOGLOBIN: 11 g/dL — AB (ref 12.0–15.0)
MCH: 29.3 pg (ref 26.0–34.0)
MCHC: 32.9 g/dL (ref 30.0–36.0)
MCV: 89.1 fL (ref 78.0–100.0)
Platelets: 190 10*3/uL (ref 150–400)
RBC: 3.75 MIL/uL — AB (ref 3.87–5.11)
RDW: 13.7 % (ref 11.5–15.5)
WBC: 9 10*3/uL (ref 4.0–10.5)

## 2014-01-23 LAB — TYPE AND SCREEN
ABO/RH(D): O POS
Antibody Screen: NEGATIVE

## 2014-01-23 LAB — ABO/RH: ABO/RH(D): O POS

## 2014-01-23 LAB — HIV ANTIBODY (ROUTINE TESTING W REFLEX): HIV 1&2 Ab, 4th Generation: NONREACTIVE

## 2014-01-23 LAB — RPR

## 2014-01-23 MED ORDER — TERBUTALINE SULFATE 1 MG/ML IJ SOLN: 0.2500 mg | Freq: Once | INTRAMUSCULAR | Status: AC | PRN

## 2014-01-23 MED ORDER — OXYTOCIN 40 UNITS IN LACTATED RINGERS INFUSION - SIMPLE MED
1.0000 m[IU]/min | INTRAVENOUS | Status: DC
Start: 2014-01-23 — End: 2014-01-24
  Administered 2014-01-23: 1 m[IU]/min via INTRAVENOUS
  Filled 2014-01-23: qty 1000

## 2014-01-23 NOTE — Plan of Care (Signed)
Problem: Consults Goal: Prenatal labs/testing reviewed upon admission Outcome: Progressing Reason for repeating labs due HTN.  Problem: Phase I Progression Outcomes Goal: Obtain and review prenatal records Outcome: Progressing Discussed signs to watch for in relation to worsening HTN, pt verbalized understanding.

## 2014-01-23 NOTE — Progress Notes (Signed)
Veronica Boyle is a 23 y.o. G2P1001 at 6794w4d by LMP and 3142w1d U/S admitted for induction of labor due to Hypertension.  Subjective: Comfortable. No complaints.  Objective: BP 138/69  Pulse 109  Temp(Src) 98 F (36.7 C) (Oral)  Resp 18  Ht 5\' 6"  (1.676 m)  Wt 126.1 kg (278 lb)  BMI 44.89 kg/m2  SpO2 100%  LMP 05/05/2013      FHT:  FHR: 150 bpm, variability: moderate,  accelerations:  Present,  decelerations:  Present few variables UC:   none SVE:   Dilation: 2.5 Effacement (%): 50 Station: -3 Exam by:: Dr Loreta AveAcosta  Labs: Lab Results  Component Value Date   WBC 9.0 01/23/2014   HGB 11.0* 01/23/2014   HCT 33.4* 01/23/2014   MCV 89.1 01/23/2014   PLT 190 01/23/2014    Assessment / Plan: Induction of labor due to gestational hypertension. 2nd foley bulb placed  Labor: early induction Preeclampsia:  no signs or symptoms of toxicity Fetal Wellbeing:  Category I Pain Control:  desires epidural I/D:  n/a Anticipated MOD:  NSVD  Veronica Boyle, Veronica Boyle 01/23/2014, 12:53 PM

## 2014-01-24 LAB — URINE CULTURE: Colony Count: 25000

## 2014-01-24 MED ORDER — FENTANYL CITRATE 0.05 MG/ML IJ SOLN
100.0000 ug | INTRAMUSCULAR | Status: DC | PRN
  Administered 2014-01-24 – 2014-01-25 (×4): 100 ug via INTRAVENOUS
  Filled 2014-01-24 (×4): qty 2

## 2014-01-24 MED ORDER — OXYTOCIN 40 UNITS IN LACTATED RINGERS INFUSION - SIMPLE MED: 1.0000 m[IU]/min | INTRAVENOUS | Status: DC

## 2014-01-24 MED ORDER — OXYTOCIN 40 UNITS IN LACTATED RINGERS INFUSION - SIMPLE MED: 2.0000 m[IU]/min | INTRAVENOUS | Status: DC

## 2014-01-24 NOTE — Progress Notes (Signed)
Patient ID: Veronica Boyle, female   DOB: Nov 29, 1990, 23 y.o.   MRN: [id number] Veronica Boyle is a 23 y.o. G2P1001 well-dated at 2779w5d [facility]  Subjective: Comfortable. States last FB felt like it was in vagina.   Objective: BP 130/66  Pulse 81  Temp(Src) 98.2 F (36.8 C) (Oral)  Resp 20  Ht 5\' 6"  (1.676 m)  Wt 126.1 kg (278 lb)  BMI 44.89 kg/m2  SpO2 100%  LMP 05/05/2013 Filed Vitals:   01/24/14 0830 01/24/14 0900 01/24/14 1010 01/24/14 1015  BP: 126/71 137/81 140/76 135/78  Pulse: 110 83 116 110  Temp:      TempSrc:      Resp:    20  Height:      Weight:      SpO2:       Fetal Heart FHR: 150 bpm, variability: moderate,  accelerations:  Present,  decelerations:  Absent   Contractions: UI  SVE:   Dilation: 2 Effacement (%): Thick Station: -3 Exam by:: Veronica Boyle,CNM Cx posterior, soft, stretchy, FB placed  Assessment / Plan:  Labor:n/a; cx unfavorable. GHTN, BPs fine Fetal Wellbeing: Category 1 Pain Control:  n/a Expected mode of delivery: TOLAC, hopeful for VBAC  Veronica Boyle 01/24/2014, 10:15 AM

## 2014-01-24 NOTE — Plan of Care (Signed)
Problem: Phase I Progression Outcomes Goal: OOB as tolerated unless otherwise ordered Outcome: Progressing Reviewed plan of care with pt.

## 2014-01-24 NOTE — Progress Notes (Signed)
Veronica Boyle is a 23 y.o. G2P1001 at 7553w4d by LMP and 2959w1d U/S admitted for induction of labor due to Hypertension, significant hx of previous cesarean section when complete 2/2 ?nonreassuring fetal heart tones  Subjective: Comfortable. No complaints.  Objective: BP 131/80  Pulse 94  Temp(Src) 98.1 F (36.7 C) (Oral)  Resp 18  Ht 5\' 6"  (1.676 m)  Wt 278 lb (126.1 kg)  BMI 44.89 kg/m2  SpO2 100%  LMP 05/05/2013 I/O last 3 completed shifts: In: 2885.4 [I.V.:2885.4] Out: -     FHT:  FHR: 150 bpm, variability: moderate,  accelerations:  Present,  decelerations:  Present few variables UC:   none SVE:   Dilation: 5 Effacement (%): 50 Station: -3 Exam by:: Dr. Loreta AveAcosta  Labs: Lab Results  Component Value Date   WBC 9.0 01/23/2014   HGB 11.0* 01/23/2014   HCT 33.4* 01/23/2014   MCV 89.1 01/23/2014   PLT 190 01/23/2014    Assessment / Plan: Induction trial of labor due to gestational hypertension.  Labor: progressing normally, too early to AROM, AROM when able Preeclampsia:  no signs or symptoms of toxicity Fetal Wellbeing:  Category I Pain Control:  desires epidural I/D:  n/a Anticipated MOD:  NSVD  Veronica Boyle 01/24/2014, 7:20 AM

## 2014-01-24 NOTE — Plan of Care (Signed)
Problem: Phase I Progression Outcomes Goal: Pain controlled with appropriate interventions Outcome: Progressing Reviewed pain relief options with pt./ verbalized understanding.

## 2014-01-25 ENCOUNTER — Encounter (HOSPITAL_COMMUNITY): Payer: Medicaid Other | Admitting: Anesthesiology

## 2014-01-25 ENCOUNTER — Encounter (HOSPITAL_COMMUNITY)
Admission: AD | Disposition: A | Discharge status: Home / Self Care | Payer: Self-pay | Source: Ambulatory Visit | Attending: Family Medicine

## 2014-01-25 ENCOUNTER — Inpatient Hospital Stay (HOSPITAL_COMMUNITY): Payer: Medicaid Other | Admitting: Anesthesiology

## 2014-01-25 ENCOUNTER — Encounter (HOSPITAL_COMMUNITY): Payer: Self-pay | Admitting: *Deleted

## 2014-01-25 DIAGNOSIS — Z3A37 37 weeks gestation of pregnancy: Secondary | ICD-10-CM

## 2014-01-25 DIAGNOSIS — O133 Gestational [pregnancy-induced] hypertension without significant proteinuria, third trimester: Secondary | ICD-10-CM

## 2014-01-25 LAB — CBC
HEMATOCRIT: 32.9 % — AB (ref 36.0–46.0)
HEMOGLOBIN: 11.1 g/dL — AB (ref 12.0–15.0)
MCH: 30 pg (ref 26.0–34.0)
MCHC: 33.7 g/dL (ref 30.0–36.0)
MCV: 88.9 fL (ref 78.0–100.0)
Platelets: 174 10*3/uL (ref 150–400)
RBC: 3.7 MIL/uL — ABNORMAL LOW (ref 3.87–5.11)
RDW: 13.8 % (ref 11.5–15.5)
WBC: 11.6 10*3/uL — ABNORMAL HIGH (ref 4.0–10.5)

## 2014-01-25 SURGERY — Surgical Case
Anesthesia: Epidural

## 2014-01-25 MED ORDER — SENNOSIDES-DOCUSATE SODIUM 8.6-50 MG PO TABS
2.0000 | ORAL_TABLET | ORAL | Status: DC
  Administered 2014-01-25 – 2014-01-27 (×3): 2 via ORAL
  Filled 2014-01-25 (×3): qty 2

## 2014-01-25 MED ORDER — MEPERIDINE HCL 25 MG/ML IJ SOLN: 6.2500 mg | INTRAMUSCULAR | Status: DC | PRN

## 2014-01-25 MED ORDER — SCOPOLAMINE 1 MG/3DAYS TD PT72
MEDICATED_PATCH | TRANSDERMAL | Status: AC
  Filled 2014-01-25: qty 1

## 2014-01-25 MED ORDER — WITCH HAZEL-GLYCERIN EX PADS: 1.0000 "application " | MEDICATED_PAD | CUTANEOUS | Status: DC | PRN

## 2014-01-25 MED ORDER — NALBUPHINE HCL 10 MG/ML IJ SOLN
5.0000 mg | INTRAMUSCULAR | Status: DC | PRN
  Administered 2014-01-25: 5 mg via INTRAVENOUS
  Filled 2014-01-25: qty 1

## 2014-01-25 MED ORDER — FENTANYL 2.5 MCG/ML BUPIVACAINE 1/10 % EPIDURAL INFUSION (WH - ANES)
INTRAMUSCULAR | Status: DC | PRN
  Administered 2014-01-25: 14 mL/h via EPIDURAL

## 2014-01-25 MED ORDER — LACTATED RINGERS IV SOLN
INTRAVENOUS | Status: DC
  Administered 2014-01-25: 125 mL/h via INTRAVENOUS
  Administered 2014-01-25: 20:00:00 via INTRAVENOUS

## 2014-01-25 MED ORDER — DIPHENHYDRAMINE HCL 50 MG/ML IJ SOLN
12.5000 mg | INTRAMUSCULAR | Status: DC | PRN
  Administered 2014-01-25: 12.5 mg via INTRAVENOUS
  Filled 2014-01-25: qty 1

## 2014-01-25 MED ORDER — SODIUM BICARBONATE 8.4 % IV SOLN
INTRAVENOUS | Status: DC | PRN
  Administered 2014-01-25: 5 mL via EPIDURAL
  Administered 2014-01-25: 10 mL via EPIDURAL

## 2014-01-25 MED ORDER — KETOROLAC TROMETHAMINE 30 MG/ML IJ SOLN
30.0000 mg | Freq: Four times a day (QID) | INTRAMUSCULAR | Status: AC | PRN
  Administered 2014-01-25: 30 mg via INTRAMUSCULAR

## 2014-01-25 MED ORDER — ONDANSETRON HCL 4 MG/2ML IJ SOLN
INTRAMUSCULAR | Status: DC | PRN
  Administered 2014-01-25: 4 mg via INTRAVENOUS

## 2014-01-25 MED ORDER — NALBUPHINE HCL 10 MG/ML IJ SOLN: 5.0000 mg | INTRAMUSCULAR | Status: DC | PRN

## 2014-01-25 MED ORDER — SIMETHICONE 80 MG PO CHEW
80.0000 mg | CHEWABLE_TABLET | ORAL | Status: DC
  Administered 2014-01-25 – 2014-01-27 (×3): 80 mg via ORAL
  Filled 2014-01-25 (×3): qty 1

## 2014-01-25 MED ORDER — OXYTOCIN 10 UNIT/ML IJ SOLN
INTRAMUSCULAR | Status: AC
  Filled 2014-01-25: qty 4

## 2014-01-25 MED ORDER — DIPHENHYDRAMINE HCL 50 MG/ML IJ SOLN: 12.5000 mg | INTRAMUSCULAR | Status: DC | PRN

## 2014-01-25 MED ORDER — SIMETHICONE 80 MG PO CHEW
80.0000 mg | CHEWABLE_TABLET | Freq: Three times a day (TID) | ORAL | Status: DC
  Administered 2014-01-25 – 2014-01-28 (×8): 80 mg via ORAL
  Filled 2014-01-25 (×9): qty 1

## 2014-01-25 MED ORDER — ONDANSETRON HCL 4 MG/2ML IJ SOLN: 4.0000 mg | Freq: Three times a day (TID) | INTRAMUSCULAR | Status: DC | PRN

## 2014-01-25 MED ORDER — LIDOCAINE-EPINEPHRINE (PF) 2 %-1:200000 IJ SOLN
INTRAMUSCULAR | Status: AC
  Filled 2014-01-25: qty 20

## 2014-01-25 MED ORDER — PROMETHAZINE HCL 25 MG/ML IJ SOLN: 6.2500 mg | INTRAMUSCULAR | Status: DC | PRN

## 2014-01-25 MED ORDER — PHENYLEPHRINE 8 MG IN D5W 100 ML (0.08MG/ML) PREMIX OPTIME
INJECTION | INTRAVENOUS | Status: AC
  Filled 2014-01-25: qty 100

## 2014-01-25 MED ORDER — IBUPROFEN 600 MG PO TABS
600.0000 mg | ORAL_TABLET | Freq: Four times a day (QID) | ORAL | Status: DC | PRN
Start: 2014-01-25 — End: 2014-01-25

## 2014-01-25 MED ORDER — NALBUPHINE HCL 10 MG/ML IJ SOLN: 5.0000 mg | Freq: Once | INTRAMUSCULAR | Status: AC | PRN

## 2014-01-25 MED ORDER — OXYCODONE-ACETAMINOPHEN 5-325 MG PO TABS
1.0000 | ORAL_TABLET | ORAL | Status: DC | PRN
  Administered 2014-01-26 – 2014-01-27 (×3): 1 via ORAL
  Filled 2014-01-25 (×3): qty 1

## 2014-01-25 MED ORDER — OXYCODONE-ACETAMINOPHEN 5-325 MG PO TABS
2.0000 | ORAL_TABLET | ORAL | Status: DC | PRN
  Administered 2014-01-27: 2 via ORAL
  Filled 2014-01-25: qty 2

## 2014-01-25 MED ORDER — FENTANYL 2.5 MCG/ML BUPIVACAINE 1/10 % EPIDURAL INFUSION (WH - ANES)
14.0000 mL/h | INTRAMUSCULAR | Status: DC | PRN
  Administered 2014-01-25: 14 mL/h via EPIDURAL
  Filled 2014-01-25: qty 125

## 2014-01-25 MED ORDER — TETANUS-DIPHTH-ACELL PERTUSSIS 5-2.5-18.5 LF-MCG/0.5 IM SUSP
0.5000 mL | Freq: Once | INTRAMUSCULAR | Status: DC
Start: 2014-01-26 — End: 2014-01-25

## 2014-01-25 MED ORDER — ONDANSETRON HCL 4 MG PO TABS: 4.0000 mg | ORAL_TABLET | ORAL | Status: DC | PRN

## 2014-01-25 MED ORDER — NALOXONE HCL 1 MG/ML IJ SOLN
1.0000 ug/kg/h | INTRAVENOUS | Status: DC | PRN
  Filled 2014-01-25: qty 2

## 2014-01-25 MED ORDER — IBUPROFEN 600 MG PO TABS
600.0000 mg | ORAL_TABLET | Freq: Four times a day (QID) | ORAL | Status: DC
  Administered 2014-01-25 – 2014-01-28 (×12): 600 mg via ORAL
  Filled 2014-01-25 (×12): qty 1

## 2014-01-25 MED ORDER — EPHEDRINE 5 MG/ML INJ: 10.0000 mg | INTRAVENOUS | Status: DC | PRN

## 2014-01-25 MED ORDER — ZOLPIDEM TARTRATE 5 MG PO TABS: 5.0000 mg | ORAL_TABLET | Freq: Every evening | ORAL | Status: DC | PRN

## 2014-01-25 MED ORDER — MENTHOL 3 MG MT LOZG: 1.0000 | LOZENGE | OROMUCOSAL | Status: DC | PRN

## 2014-01-25 MED ORDER — PHENYLEPHRINE HCL 10 MG/ML IJ SOLN
INTRAMUSCULAR | Status: DC | PRN
  Administered 2014-01-25: 60 ug via INTRAVENOUS
  Administered 2014-01-25: 40 ug via INTRAVENOUS
  Administered 2014-01-25: 20 ug via INTRAVENOUS

## 2014-01-25 MED ORDER — OXYTOCIN 10 UNIT/ML IJ SOLN
40.0000 [IU] | INTRAVENOUS | Status: DC | PRN
  Administered 2014-01-25: 40 [IU] via INTRAVENOUS

## 2014-01-25 MED ORDER — LACTATED RINGERS IV SOLN
INTRAVENOUS | Status: DC | PRN
  Administered 2014-01-25: 08:00:00 via INTRAVENOUS

## 2014-01-25 MED ORDER — TERBUTALINE SULFATE 1 MG/ML IJ SOLN
0.2500 mg | Freq: Once | INTRAMUSCULAR | Status: AC
  Administered 2014-01-25: 0.25 mg via SUBCUTANEOUS

## 2014-01-25 MED ORDER — HYDROMORPHONE HCL 1 MG/ML IJ SOLN: 0.2500 mg | INTRAMUSCULAR | Status: DC | PRN

## 2014-01-25 MED ORDER — KETOROLAC TROMETHAMINE 30 MG/ML IJ SOLN: 30.0000 mg | Freq: Four times a day (QID) | INTRAMUSCULAR | Status: AC | PRN

## 2014-01-25 MED ORDER — SODIUM CHLORIDE 0.9 % IJ SOLN: 3.0000 mL | INTRAMUSCULAR | Status: DC | PRN

## 2014-01-25 MED ORDER — DIPHENHYDRAMINE HCL 25 MG PO CAPS
25.0000 mg | ORAL_CAPSULE | ORAL | Status: DC | PRN
  Administered 2014-01-27: 25 mg via ORAL
  Filled 2014-01-25 (×2): qty 1

## 2014-01-25 MED ORDER — ONDANSETRON HCL 4 MG/2ML IJ SOLN: 4.0000 mg | INTRAMUSCULAR | Status: DC | PRN

## 2014-01-25 MED ORDER — TERBUTALINE SULFATE 1 MG/ML IJ SOLN
INTRAMUSCULAR | Status: AC
  Administered 2014-01-25: 0.25 mg via SUBCUTANEOUS
  Filled 2014-01-25: qty 1

## 2014-01-25 MED ORDER — NALOXONE HCL 0.4 MG/ML IJ SOLN: 0.4000 mg | INTRAMUSCULAR | Status: DC | PRN

## 2014-01-25 MED ORDER — SIMETHICONE 80 MG PO CHEW: 80.0000 mg | CHEWABLE_TABLET | ORAL | Status: DC | PRN

## 2014-01-25 MED ORDER — OXYTOCIN 40 UNITS IN LACTATED RINGERS INFUSION - SIMPLE MED: 62.5000 mL/h | INTRAVENOUS | Status: AC

## 2014-01-25 MED ORDER — KETOROLAC TROMETHAMINE 30 MG/ML IJ SOLN
INTRAMUSCULAR | Status: AC
  Administered 2014-01-25: 30 mg via INTRAMUSCULAR
  Filled 2014-01-25: qty 1

## 2014-01-25 MED ORDER — LACTATED RINGERS IV SOLN
500.0000 mL | Freq: Once | INTRAVENOUS | Status: AC
  Administered 2014-01-25: 500 mL via INTRAVENOUS

## 2014-01-25 MED ORDER — KETOROLAC TROMETHAMINE 30 MG/ML IJ SOLN: 15.0000 mg | Freq: Once | INTRAMUSCULAR | Status: DC | PRN

## 2014-01-25 MED ORDER — DIPHENHYDRAMINE HCL 25 MG PO CAPS: 25.0000 mg | ORAL_CAPSULE | Freq: Four times a day (QID) | ORAL | Status: DC | PRN

## 2014-01-25 MED ORDER — PHENYLEPHRINE 40 MCG/ML (10ML) SYRINGE FOR IV PUSH (FOR BLOOD PRESSURE SUPPORT)
80.0000 ug | PREFILLED_SYRINGE | INTRAVENOUS | Status: DC | PRN
  Filled 2014-01-25: qty 10

## 2014-01-25 MED ORDER — FENTANYL CITRATE 0.05 MG/ML IJ SOLN
INTRAMUSCULAR | Status: AC
  Filled 2014-01-25: qty 2

## 2014-01-25 MED ORDER — ONDANSETRON HCL 4 MG/2ML IJ SOLN
INTRAMUSCULAR | Status: AC
  Filled 2014-01-25: qty 2

## 2014-01-25 MED ORDER — CEFAZOLIN SODIUM-DEXTROSE 2-3 GM-% IV SOLR
INTRAVENOUS | Status: DC | PRN
  Administered 2014-01-25: 2 g via INTRAVENOUS

## 2014-01-25 MED ORDER — LIDOCAINE HCL (PF) 1 % IJ SOLN
INTRAMUSCULAR | Status: DC | PRN
  Administered 2014-01-25 (×2): 4 mL

## 2014-01-25 MED ORDER — DIBUCAINE 1 % RE OINT: 1.0000 "application " | TOPICAL_OINTMENT | RECTAL | Status: DC | PRN

## 2014-01-25 MED ORDER — MORPHINE SULFATE (PF) 0.5 MG/ML IJ SOLN
INTRAMUSCULAR | Status: DC | PRN
  Administered 2014-01-25: 3000 ug via EPIDURAL
  Administered 2014-01-25: 2000 ug via INTRAVENOUS

## 2014-01-25 MED ORDER — LANOLIN HYDROUS EX OINT: 1.0000 "application " | TOPICAL_OINTMENT | CUTANEOUS | Status: DC | PRN

## 2014-01-25 MED ORDER — SCOPOLAMINE 1 MG/3DAYS TD PT72
1.0000 | MEDICATED_PATCH | Freq: Once | TRANSDERMAL | Status: AC
  Administered 2014-01-25: 1.5 mg via TRANSDERMAL

## 2014-01-25 MED ORDER — PHENYLEPHRINE 40 MCG/ML (10ML) SYRINGE FOR IV PUSH (FOR BLOOD PRESSURE SUPPORT): 80.0000 ug | PREFILLED_SYRINGE | INTRAVENOUS | Status: DC | PRN

## 2014-01-25 MED ORDER — PRENATAL MULTIVITAMIN CH
1.0000 | ORAL_TABLET | Freq: Every day | ORAL | Status: DC
  Administered 2014-01-27 – 2014-01-28 (×2): 1 via ORAL
  Filled 2014-01-25 (×2): qty 1

## 2014-01-25 MED ORDER — MORPHINE SULFATE 0.5 MG/ML IJ SOLN
INTRAMUSCULAR | Status: AC
  Filled 2014-01-25: qty 10

## 2014-01-25 SURGICAL SUPPLY — 37 items
BENZOIN TINCTURE PRP APPL 2/3 (GAUZE/BANDAGES/DRESSINGS) ×3 IMPLANT
CLAMP CORD UMBIL (MISCELLANEOUS) IMPLANT
CLOSURE WOUND 1/2 X4 (GAUZE/BANDAGES/DRESSINGS) ×1
CLOTH BEACON ORANGE TIMEOUT ST (SAFETY) ×3 IMPLANT
DRAPE SHEET LG 3/4 BI-LAMINATE (DRAPES) IMPLANT
DRSG OPSITE POSTOP 4X10 (GAUZE/BANDAGES/DRESSINGS) ×3 IMPLANT
DURAPREP 26ML APPLICATOR (WOUND CARE) ×3 IMPLANT
ELECT REM PT RETURN 9FT ADLT (ELECTROSURGICAL) ×3
ELECTRODE REM PT RTRN 9FT ADLT (ELECTROSURGICAL) ×1 IMPLANT
EXTRACTOR VACUUM KIWI (MISCELLANEOUS) IMPLANT
GLOVE BIO SURGEON ST LM GN SZ9 (GLOVE) ×3 IMPLANT
GLOVE BIOGEL PI IND STRL 9 (GLOVE) ×1 IMPLANT
GLOVE BIOGEL PI INDICATOR 9 (GLOVE) ×2
GOWN STRL REUS W/TWL 2XL LVL3 (GOWN DISPOSABLE) ×3 IMPLANT
GOWN STRL REUS W/TWL LRG LVL3 (GOWN DISPOSABLE) ×3 IMPLANT
NEEDLE HYPO 25X5/8 SAFETYGLIDE (NEEDLE) IMPLANT
NS IRRIG 1000ML POUR BTL (IV SOLUTION) ×3 IMPLANT
PACK C SECTION WH (CUSTOM PROCEDURE TRAY) ×3 IMPLANT
PAD OB MATERNITY 4.3X12.25 (PERSONAL CARE ITEMS) ×3 IMPLANT
RETRACTOR WND ALEXIS 25 LRG (MISCELLANEOUS) ×1 IMPLANT
RTRCTR C-SECT PINK 25CM LRG (MISCELLANEOUS) IMPLANT
RTRCTR C-SECT PINK 34CM XLRG (MISCELLANEOUS) IMPLANT
RTRCTR WOUND ALEXIS 25CM LRG (MISCELLANEOUS) ×3
STRIP CLOSURE SKIN 1/2X4 (GAUZE/BANDAGES/DRESSINGS) ×2 IMPLANT
SUT MNCRL 0 VIOLET CTX 36 (SUTURE) ×2 IMPLANT
SUT MONOCRYL 0 CTX 36 (SUTURE) ×4
SUT PROLENE 2 0 CT 30 (SUTURE) ×3 IMPLANT
SUT VIC AB 0 CT1 27 (SUTURE) ×2
SUT VIC AB 0 CT1 27XBRD ANBCTR (SUTURE) ×1 IMPLANT
SUT VIC AB 2-0 CT1 (SUTURE) ×3 IMPLANT
SUT VIC AB 2-0 CT1 27 (SUTURE) ×2
SUT VIC AB 2-0 CT1 TAPERPNT 27 (SUTURE) ×1 IMPLANT
SUT VIC AB 4-0 KS 27 (SUTURE) ×3 IMPLANT
SYR BULB IRRIGATION 50ML (SYRINGE) IMPLANT
TOWEL OR 17X24 6PK STRL BLUE (TOWEL DISPOSABLE) ×3 IMPLANT
TRAY FOLEY CATH 14FR (SET/KITS/TRAYS/PACK) ×3 IMPLANT
WATER STERILE IRR 1000ML POUR (IV SOLUTION) ×3 IMPLANT

## 2014-01-25 NOTE — Anesthesia Postprocedure Evaluation (Signed)
  Anesthesia Post Note  Patient: Veronica Boyle  Procedure(s) Performed: Procedure(s) (LRB): CESAREAN SECTION (N/A)  Anesthesia type: Epidural  Patient location: PACU  Post pain: Pain level controlled  Post assessment: Post-op Vital signs reviewed  Last Vitals:  Filed Vitals:   01/25/14 0930  BP: 100/72  Pulse: 88  Temp:   Resp: 20    Post vital signs: Reviewed  Level of consciousness: awake  Complications: No apparent anesthesia complications

## 2014-01-25 NOTE — Progress Notes (Signed)
Veronica Boyle is a 23 y.o. G2P1001 at 4614w6d by LMP admitted for induction of labor due to gest Hypertension. Pt is a prior cesarean for fetal indications, who was admitted Monday, now s/p  Foley bulb cervical ripening, and has been on pitocin.  Earlier this AM the patient had several decelerations that were late in position, accompanied by good beat to beat variability, and they resolved with discontinuation of pitocin.  FHR now Cat I. Contractions mild , q 3-5   Subjective: No bleeding. Arom done this a.m.  Objective: BP 116/66  Pulse 84  Temp(Src) 98.2 F (36.8 C) (Oral)  Resp 16  Ht 5\' 6"  (1.676 m)  Wt 278 lb (126.1 kg)  BMI 44.89 kg/m2  SpO2 99%  LMP 05/05/2013 I/O last 3 completed shifts: In: 2885.4 [I.Boyle.:2885.4] Out: -     FHT:  FHR: 150 bpm, variability: moderate,  accelerations:  Present,  decelerations:  Absent UC:   irregular, every 5-6 minutes SVE:   Dilation: 5 Effacement (%): 20;30 Station: -2;-3 Exam by:: Dr. Emelda FearFerguson Cervical tissues very soft. Arom, with clear fluid obtained. Bladder foley bulb was below the vertex and is slid upward above the vertex. Labs: Lab Results  Component Value Date   WBC 11.6* 01/25/2014   HGB 11.1* 01/25/2014   HCT 32.9* 01/25/2014   MCV 88.9 01/25/2014   PLT 174 01/25/2014    Assessment / Plan: Induction of labor due to gestational hypertension,  progressing well on pitocin  Labor: Pitocin restarted at 5:30 am Preeclampsia:  BP stable at present Fetal Wellbeing:  Category I Pain Control:  Epidural I/D:  n/a Anticipated MOD:  undetermined, high presenting part to be monitored for progress  Veronica Boyle 01/25/2014, 6:19 AM

## 2014-01-25 NOTE — Transfer of Care (Signed)
Immediate Anesthesia Transfer of Care Note  Patient: Veronica Boyle  Procedure(s) Performed: Procedure(s): CESAREAN SECTION (N/A)  Patient Location: PACU  Anesthesia Type:Epidural  Level of Consciousness: awake, alert , oriented and patient cooperative  Airway & Oxygen Therapy: Patient Spontanous Breathing  Post-op Assessment: Report given to PACU RN and Post -op Vital signs reviewed and stable  Post vital signs: Reviewed and stable  Complications: No apparent anesthesia complications

## 2014-01-25 NOTE — Anesthesia Procedure Notes (Signed)
Epidural Patient location during procedure: OB  Staffing Anesthesiologist: Domonic Kimball R Performed by: anesthesiologist   Preanesthetic Checklist Completed: patient identified, pre-op evaluation, timeout performed, IV checked, risks and benefits discussed and monitors and equipment checked  Epidural Patient position: sitting Prep: site prepped and draped and DuraPrep Patient monitoring: heart rate Approach: midline Location: L3-L4 Injection technique: LOR air and LOR saline  Needle:  Needle type: Tuohy  Needle gauge: 17 G Needle length: 9 cm Needle insertion depth: 9 cm Catheter type: closed end flexible Catheter size: 19 Gauge Catheter at skin depth: 15 cm Test dose: negative  Assessment Sensory level: T8 Events: blood not aspirated, injection not painful, no injection resistance, negative IV test and no paresthesia  Additional Notes Reason for block:procedure for pain   

## 2014-01-25 NOTE — Addendum Note (Signed)
Addendum created 01/25/14 1738 by Turner DanielsJennifer L Izik Bingman, CRNA   Modules edited: Charges VN, Notes Section   Notes Section:  File: [id number]

## 2014-01-25 NOTE — Brief Op Note (Signed)
01/22/2014 - 01/25/2014  9:21 AM  PATIENT:  Veronica Boyle  23 y.o. female  PRE-OPERATIVE DIAGNOSIS:  Repeat cesarean section due to fetal intolerance to labor Failed VBAC POST-OPERATIVE DIAGNOSIS:  Repeat cesarean section due to fetal intolerance to labor FAiled VBAC PROCEDURE:  Procedure(s): CESAREAN SECTION (N/A) Inferior extension into the cervix on the right SURGEON:  Surgeon(s) and Role:    * Tilda BurrowJohn Afton Mikelson V, MD - Primary  PHYSICIAN ASSISTANT:   ASSISTANTS: none   ANESTHESIA:   epidural  EBL:  Total I/O In: 1900 [I.V.:1900] Out: 875 [Urine:75; Blood:800]  BLOOD ADMINISTERED:none  DRAINS: Urinary Catheter (Foley)   LOCAL MEDICATIONS USED:  NONE  SPECIMEN:  No Specimen  DISPOSITION OF SPECIMEN:  l&D  COUNTS:  YES  TOURNIQUET:  * No tourniquets in log *  DICTATION: .Dragon Dictation  PLAN OF CARE: has admit  PATIENT DISPOSITION:  PACU - hemodynamically stable.   Delay start of Pharmacological VTE agent (>24hrs) due to surgical blood loss or risk of bleeding: not applicable

## 2014-01-25 NOTE — Lactation Note (Signed)
This note was copied from the chart of Veronica Boyle. Lactation Consultation Note  Patient Name: Veronica Boyle ZOXWR'UToday's Date: 01/25/2014 Reason for consult: Initial assessment of this second-time mom and her newborn, now 13 hours postpartum.  Mom states she was only able to nurse her first baby for 6 days due to maternal complications.  Her newborn has been latching well and mom states that she is able to hand express colostrum.  LC encouraged frequent STS and cue feedings.LC encouraged review of Baby and Me pp 9, 14 and 20-25 for STS and BF information. LC provided [facility] Resource brochure and reviewed [facility] services and list of community and web site resources.     Maternal Data Formula Feeding for Exclusion: No Has patient been taught Hand Expression?: Yes (RN documented teaching hand expression at 0925 feeding) Does the patient have breastfeeding experience prior to this delivery?: Yes  Feeding Feeding Type: Breast Fed  LATCH Score/Interventions Latch: Repeated attempts needed to sustain latch, nipple held in mouth throughout feeding, stimulation needed to elicit sucking reflex. Intervention(s): Adjust position;Assist with latch;Breast massage;Breast compression  Audible Swallowing: None  Type of Nipple: Everted at rest and after stimulation  Comfort (Breast/Nipple): Soft / non-tender     Hold (Positioning): Assistance needed to correctly position infant at breast and maintain latch. Intervention(s): Support Pillows  LATCH Score: 6   (feeding assessment by RN; baby and mom both needing latch assistance but mom has everted/intact nipples and baby is able to latch with help.)  Initial feeding=30 minutes  Lactation Tools Discussed/Used   STS, cue feedings, hand expression  Consult Status Consult Status: Follow-up Date: 01/26/14 Follow-up type: In-patient    Warrick ParisianBryant, Netha Dafoe [facility] 01/25/2014, 10:08 PM

## 2014-01-25 NOTE — Op Note (Signed)
Preop: Pregnancy 37 weeks 4 days, gestational hypertension, prior cesarean section desiring trial of labor, unsuccessful; fetal intolerance of labor Postop: Same, delivered, also inferior laceration into the cervix at time of delivery, repaired Procedure repeat low transverse cervical cesarean section, repair of extension into the cervix. Surgeon, Emelda FearFerguson Asst., none Anesthesia epidural Complications inferior laceration into the cervix at time of delivery at the patient's right side at transverse uterine incision, repaired. EBL 800 Drains: Foley Specimen, placenta to labor and delivery Indications: This 23 year old female was induced beginning 01/22/2014 for gestational hypertension. Foley bulb cervical ripening had been performed x3, then overnight the patient had been on Pitocin induction, temporary discontinuation due to fetal intolerance of labor. At one hour amniotomy was performed fetal well-being confirmed, and Pitocin reinitiated. Fetal heart rate decelerations, category 2 again developed. Scalp electrode and IUPC were inserted, but decelerations became significantly severe, nonreassuring lasting greater than 60 seconds. Patient's cervical exam still remained 5 cm and -2 to -3 station. Recommendation for repeat cesarean section was made and readily accepted the patient with usual counseling of the risks and benefits and rationale, including risk of injury to adjacent organs mentioned.  Findings at time of cesarean section included a somewhat asynclitic vertex presentation, body cord times one, clear amniotic fluid without malodor, with delivery notable for an inferior extension of the transverse uterine incision into the cervix occurring at the very right lateral end of the incision, repaired at the time of surgery Details of procedure: Patient was taken operating room prepped and draped for lower abdominal surgery. Timeout was conducted antibiotics were administered procedure confirmed by  surgical team. Transverse incision was made at the previous transverse incision site with excision of the old cicatrix. Fascia was opened transversely, and extensive omental adhesions to the back of the fascia was encountered. These extended all the way down to the space of Retzius and required significant amount of sharp dissection to free up the omentum so that it could be pushed out of the way. Alexis wound retractor was positioned in place, and the bladder noted to be adherent to the anterior uterus wall and released. The bladder flap was then developed. Transverse uterine incision was then made in the midline, extended laterally using index finger traction and then pulled cephalad and caudad sufficiently to try and deliver the vertex in the incision fetal vertex was somewhat asynclitic and required rotating into the incision for second fundal pressure was able to expel the infant, cord was clamped and baby passed to the pediatrician. See their notes for further details of the baby which appeared good health. Placenta delivered in response to the chordae uterine massage and IV oxytocin and was intact with membranes easily spilled. The incision was inspected and there was noted to be an extension from the inferior aspects of the transverse incision in a downward fashion. The anterior lip of cervix could be traced around to the side where the incision  extension existed and ring forceps was placed on the edge of the cervical laceration the cervix were was reapproximated using running locking 0 Monocryl, being careful not to get into the area near the bladder. The bladder was felt to be pulled well away from the area of concern. The uterine incision was then closed in a running locking first layer of 0 Monocryl and then oversewing a second layer of Monocryl with good tissue edge approximation and hemostasis. Bladder flap did not require reapproximation. Pelvis was irrigated. Anterior peritoneum was closed using  2-0 Vicryl. The  omental adhesions that had been cut free were hemostatic.Marland Kitchen. The fascia was closed with running 0 Vicryl and subcutaneous tissue approximated after mobilizing the old fibrosis on the inferior aspect of the incision and placing 3 horizontal mattress sutures of 2-0 Vicryl in the subcutaneous fatty space to obliterate potential space and reapproximate tissues. The skin was closed with subcuticular 4-0 Vicryl closure with good tissue edge approximation. Sponge and needle counts were correct patient to recovery in stable condition.

## 2014-01-25 NOTE — Anesthesia Preprocedure Evaluation (Signed)
Anesthesia Evaluation  Patient identified by MRN, date of birth, ID band Patient awake    Reviewed: Allergy & Precautions, H&P , NPO status , Patient's Chart, lab work & pertinent test results  Airway Mallampati: II TM Distance: >3 FB Neck ROM: Full    Dental no notable dental hx.    Pulmonary neg pulmonary ROS,  breath sounds clear to auscultation  Pulmonary exam normal       Cardiovascular hypertension, + dysrhythmias Rhythm:Regular Rate:Normal     Neuro/Psych negative neurological ROS  negative psych ROS   GI/Hepatic negative GI ROS, Neg liver ROS,   Endo/Other  negative endocrine ROS  Renal/GU negative Renal ROS     Musculoskeletal negative musculoskeletal ROS (+)   Abdominal   Peds  Hematology negative hematology ROS (+)   Anesthesia Other Findings   Reproductive/Obstetrics (+) Pregnancy                           Anesthesia Physical Anesthesia Plan  ASA: III  Anesthesia Plan: Epidural   Post-op Pain Management:    Induction:   Airway Management Planned:   Additional Equipment:   Intra-op Plan:   Post-operative Plan:   Informed Consent: I have reviewed the patients History and Physical, chart, labs and discussed the procedure including the risks, benefits and alternatives for the proposed anesthesia with the patient or authorized representative who has indicated his/her understanding and acceptance.     Plan Discussed with:   Anesthesia Plan Comments:         Anesthesia Quick Evaluation

## 2014-01-25 NOTE — Anesthesia Postprocedure Evaluation (Signed)
  Anesthesia Post-op Note  Anesthesia Post Note  Patient: Veronica Boyle  Procedure(s) Performed: Procedure(s) (LRB): CESAREAN SECTION (N/A)  Anesthesia type: Epidural  Patient location: Mother/Baby  Post pain: Pain level controlled  Post assessment: Post-op Vital signs reviewed  Last Vitals:  Filed Vitals:   01/25/14 1735  BP: 106/41  Pulse: 72  Temp: 36.8 C  Resp: 20    Post vital signs: Reviewed  Level of consciousness:alert  Complications: No apparent anesthesia complications

## 2014-01-26 ENCOUNTER — Encounter (HOSPITAL_COMMUNITY): Payer: Self-pay | Admitting: Obstetrics and Gynecology

## 2014-01-26 LAB — CBC
HEMATOCRIT: 25.3 % — AB (ref 36.0–46.0)
HEMOGLOBIN: 8.5 g/dL — AB (ref 12.0–15.0)
MCH: 30.2 pg (ref 26.0–34.0)
MCHC: 33.6 g/dL (ref 30.0–36.0)
MCV: 90 fL (ref 78.0–100.0)
Platelets: 140 10*3/uL — ABNORMAL LOW (ref 150–400)
RBC: 2.81 MIL/uL — ABNORMAL LOW (ref 3.87–5.11)
RDW: 14.1 % (ref 11.5–15.5)
WBC: 9 10*3/uL (ref 4.0–10.5)

## 2014-01-26 LAB — BIRTH TISSUE RECOVERY COLLECTION (PLACENTA DONATION)

## 2014-01-26 NOTE — Lactation Note (Signed)
This note was copied from the chart of Veronica Boyle. Lactation Consultation Note  Patient Name: Veronica Boyle UEAVW'UToday's Date: 01/26/2014 Reason for consult: Follow-up assessment  Infant on breast when entered room; cradle hold with depth.  Taught flanging of bottom lip.  Mom using scissor hold on breast to get infant latched with depth.  Infant is 32 hrs old; GA 37.6 weeks; 3% weight loss.  Infant has breastfed x6 (15-60 min) + 2 attempts in past 24 hrs; voids -1; stools-4 in past 24 hrs.  Mom states the infant is wanting to feed more today.  Educated on cluster feeding, size of infant's stomach, and reviewed feeding cues.  Encouraged to continue exclusive breastfeeding; educated on milk production and supply/ demand.  Hand pump given and demonstrated use for after discharge if needed.  Mom asked for breast pads and stated she is beginning to leak milk.  Breast pads given and encouraged mom to change pads when wet.  Encouraged to call for questions or assistance if needed.  Infant still on breast when left room.     LATCH Score/Interventions Latch: Grasps breast easily, tongue down, lips flanged, rhythmical sucking.  Audible Swallowing: A few with stimulation  Type of Nipple: Everted at rest and after stimulation  Comfort (Breast/Nipple): Soft / non-tender     Hold (Positioning): No assistance needed to correctly position infant at breast.  LATCH Score: 9  Lactation Tools Discussed/Used WIC Program: Yes   Consult Status Consult Status: Follow-up Date: 01/27/14 Follow-up type: In-patient    Lendon KaVann, Noe Goyer Walker 01/26/2014, 4:48 PM

## 2014-01-26 NOTE — Progress Notes (Signed)
Post Operative Day 1  Veronica Boyle is a 23 y.o G2P2002 s/p rLTCS for NRFHR w failed TOLAC and gest htn.   Subjective: Pt is currently doing well. Has no complaints. No overnight events. Afebrile. No difficulty voiding, passing flatus, po intake. Period like bleeding. Pain well controlled. Minimal swelling. Method of feeding: Breast. Birth control: IUD.    Objective: Blood pressure 115/59, pulse 91, temperature 98.9 F (37.2 C), temperature source Oral, resp. rate 20, height 5\' 6"  (1.676 m), weight 126.1 kg (278 lb), last menstrual period 05/05/2013, SpO2 96.00%, unknown if currently breastfeeding.  Physical Exam:  General: Well appearing, no acute distress. Cooperative.  Lochia: Appropriate, sanguineous Uterine Fundus: Firm Incision: Wound dressed, no signs of infections DVT Evaluation: No signs of DVT on physical exam.  Extremities: Minimal swelling.   Recent Labs  01/25/14 0235 01/26/14 0555  HGB 11.1* 8.5*  HCT 32.9* 25.3*    Assessment/Plan: Veronica Boyle is a 23 y.o G2P2002 s/p rLTCS for NRFHR w failed TOLAC and gest htn.   Pt is currently doing well. Asymptomatic. Will continue to monitor today.   LOS: 4 days   CRESENZO-DISHMAN,Delynn Olvera 01/26/2014, 8:00 AM

## 2014-01-27 MED ORDER — TRIAMCINOLONE ACETONIDE 0.1 % EX CREA
TOPICAL_CREAM | Freq: Three times a day (TID) | CUTANEOUS | Status: DC
  Administered 2014-01-27 – 2014-01-28 (×4): via TOPICAL
  Filled 2014-01-27: qty 15

## 2014-01-27 NOTE — Progress Notes (Signed)
Subjective: Postpartum Day #2: Cesarean Delivery Patient reports tolerating PO, + flatus and + BM.  Breastfeeding going well; desires Mirena for contraception. C/o 'rash' on back along epidural tape area.  Objective: Vital signs in last 24 hours: Temp:  [98.3 F (36.8 C)-98.6 F (37 C)] 98.3 F (36.8 C) (10/17 0600) Pulse Rate:  [75-87] 75 (10/17 0600) Resp:  [18-20] 18 (10/17 0600) BP: (117-126)/(60-61) 117/61 mmHg (10/17 0600)  Physical Exam:  General: alert, cooperative and no distress Heart: RRR Lungs: nl effort Lochia: appropriate Uterine Fundus: firm Incision: honeycomb dsg, marked and unchanged DVT Evaluation: No evidence of DVT seen on physical exam.   Recent Labs  01/25/14 0235 01/26/14 0555  HGB 11.1* 8.5*  HCT 32.9* 25.3*    Assessment/Plan: Status post Cesarean section. Doing well postoperatively.  Continue current care. Desires d/c tomorrow. Kenalog for back.  Cam HaiSHAW, Rashay Barnette CNM 01/27/2014, 8:53 AM

## 2014-01-27 NOTE — Lactation Note (Signed)
This note was copied from the chart of Veronica Boyle. Lactation Consultation Note  Patient Name: Veronica Irven Baltimoreicolle Abbasi [id number]'s Date: 01/27/2014 Reason for consult: Follow-up assessment;Infant weight loss;Other (Comment) (baby is exclusively breastfeeding and wt loss 7% last night) Mom reports that baby has been latching well and output exceeds minimum for this hour of life (7 wets and 8 stools since birth and 8859 hours of age).  Mom states that her white milk is flowing and her breasts are feeling fuller today.  Baby has LATCH scores of 8/9 and feedings of 10-35 minutes each.  LC encouraged continued cue feedings tonight.  Per Mom, the Pediatrician did not mention the weight loss as a concern this morning.   Maternal Data    Feeding Length of feed: 30 min  LATCH Score/Interventions Latch: Grasps breast easily, tongue down, lips flanged, rhythmical sucking.  Audible Swallowing: Spontaneous and intermittent Intervention(s): Skin to skin;Hand expression  Type of Nipple: Everted at rest and after stimulation  Comfort (Breast/Nipple): Soft / non-tender     Hold (Positioning): Assistance needed to correctly position infant at breast and maintain latch.  LATCH Score: 9 (most recent LATCH assessment, per RN)  Lactation Tools Discussed/Used   Cue feedings ad lib  Consult Status Consult Status: Follow-up Date: 01/28/14 Follow-up type: In-patient    Warrick ParisianBryant, Shontell Prosser [facility] 01/27/2014, 8:05 PM

## 2014-01-28 MED ORDER — IBUPROFEN 600 MG PO TABS: 600.0000 mg | ORAL_TABLET | Freq: Four times a day (QID) | ORAL | Status: DC

## 2014-01-28 MED ORDER — OXYCODONE-ACETAMINOPHEN 5-325 MG PO TABS: 1.0000 | ORAL_TABLET | Freq: Four times a day (QID) | ORAL | Status: DC | PRN

## 2014-01-28 MED ORDER — DOCUSATE SODIUM 100 MG PO CAPS: 100.0000 mg | ORAL_CAPSULE | Freq: Two times a day (BID) | ORAL | Status: DC | PRN

## 2014-01-28 NOTE — Discharge Summary (Signed)
Obstetric Discharge Summary Reason for Admission: onset of labor Prenatal Procedures: ultrasound Intrapartum Procedures: cesarean: low cervical, transverse Postpartum Procedures: none Complications-Operative and Postpartum: laceration into cervix, repaired, see Op note Hemoglobin  Date Value Ref Range Status  01/26/2014 8.5* 12.0 - 15.0 g/dL Final     DELTA CHECK NOTED     REPEATED TO VERIFY     HCT  Date Value Ref Range Status  01/26/2014 25.3* 36.0 - 46.0 % Final    Physical Exam:  General: alert, cooperative and no distress Lochia: appropriate Uterine Fundus: firm Incision: no significant drainage DVT Evaluation: No evidence of DVT seen on physical exam. Negative Homan's sign. No cords or calf tenderness. No significant calf/ankle edema.  Discharge Diagnoses: Term Pregnancy-delivered Operative Note: On 01/25/14: Preop: Pregnancy 37 weeks 4 days, gestational hypertension, prior cesarean section desiring trial of labor, unsuccessful; fetal intolerance of labor  Postop: Same, delivered, also inferior laceration into the cervix at time of delivery, repaired  Procedure repeat low transverse cervical cesarean section, repair of extension into the cervix.  Surgeon, Emelda FearFerguson  Asst., none  Anesthesia epidural  Complications inferior laceration into the cervix at time of delivery at the patient's right side at transverse uterine incision, repaired.  Discharge Information: Date: 01/28/2014 Activity: pelvic rest Diet: routine Medications: Ibuprofen, Colace and Percocet Condition: stable Instructions: refer to practice specific booklet Discharge to: home Contraception: Mirena Follow-up Information   Schedule an appointment as soon as possible [facility] (In 4-6 weeks for postpartum visit)    Specialty:  Obstetrics and Gynecology   Contact information:   [address] [place] [id number] [phone]      Newborn Data: Live born  female  Birth Weight: 7 lb 1 oz (3204 g) APGAR: 9, 9  Home with mother.  Veronica Boyle, Veronica Boyle 01/28/2014, 7:35 AM

## 2014-01-28 NOTE — Discharge Instructions (Signed)
Cesarean Delivery, Care After °Refer to this sheet in the next few weeks. These instructions provide you with information on caring for yourself after your procedure. Your health care provider may also give you specific instructions. Your treatment has been planned according to current medical practices, but problems sometimes occur. Call your health care provider if you have any problems or questions after you go home. °HOME CARE INSTRUCTIONS  °· Only take over-the-counter or prescription medications as directed by your health care provider. °· Do not drink alcohol, especially if you are breastfeeding or taking medication to relieve pain. °· Do not chew or smoke tobacco. °· Continue to use good perineal care. Good perineal care includes: °¨ Wiping your perineum from front to back. °¨ Keeping your perineum clean. °· Check your surgical cut (incision) daily for increased redness, drainage, swelling, or separation of skin. °· Clean your incision gently with soap and water every day, and then pat it dry. If your health care provider says it is okay, leave the incision uncovered. Use a bandage (dressing) if the incision is draining fluid or appears irritated. If the adhesive strips across the incision do not fall off within 7 days, carefully peel them off. °· Hug a pillow when coughing or sneezing until your incision is healed. This helps to relieve pain. °· Do not use tampons or douche until your health care provider says it is okay. °· Shower, wash your hair, and take tub baths as directed by your health care provider. °· Wear a well-fitting bra that provides breast support. °· Limit wearing support panties or control-top hose. °· Drink enough fluids to keep your urine clear or pale yellow. °· Eat high-fiber foods such as whole grain cereals and breads, brown rice, beans, and fresh fruits and vegetables every day. These foods may help prevent or relieve constipation. °· Resume activities such as climbing stairs,  driving, lifting, exercising, or traveling as directed by your health care provider. °· Talk to your health care provider about resuming sexual activities. This is dependent upon your risk of infection, your rate of healing, and your comfort and desire to resume sexual activity. °· Try to have someone help you with your household activities and your newborn for at least a few days after you leave the hospital. °· Rest as much as possible. Try to rest or take a nap when your newborn is sleeping. °· Increase your activities gradually. °· Keep all of your scheduled postpartum appointments. It is very important to keep your scheduled follow-up appointments. At these appointments, your health care provider will be checking to make sure that you are healing physically and emotionally. °SEEK MEDICAL CARE IF:  °· You are passing large clots from your vagina. Save any clots to show your health care provider. °· You have a foul smelling discharge from your vagina. °· You have trouble urinating. °· You are urinating frequently. °· You have pain when you urinate. °· You have a change in your bowel movements. °· You have increasing redness, pain, or swelling near your incision. °· You have pus draining from your incision. °· Your incision is separating. °· You have painful, hard, or reddened breasts. °· You have a severe headache. °· You have blurred vision or see spots. °· You feel sad or depressed. °· You have thoughts of hurting yourself or your newborn. °· You have questions about your care, the care of your newborn, or medications. °· You are dizzy or light-headed. °· You have a rash. °· You   have pain, redness, or swelling at the site of the removed intravenous access (IV) tube. °· You have nausea or vomiting. °· You stopped breastfeeding and have not had a menstrual period within 12 weeks of stopping. °· You are not breastfeeding and have not had a menstrual period within 12 weeks of delivery. °· You have a fever. °SEEK  IMMEDIATE MEDICAL CARE IF: °· You have persistent pain. °· You have chest pain. °· You have shortness of breath. °· You faint. °· You have leg pain. °· You have stomach pain. °· Your vaginal bleeding saturates 2 or more sanitary pads in 1 hour. °MAKE SURE YOU:  °· Understand these instructions. °· Will watch your condition. °· Will get help right away if you are not doing well or get worse. °Document Released: 12/20/2001 Document Revised: 08/14/2013 Document Reviewed: 11/25/2011 °ExitCare® Patient Information ©2015 [employer]. This information is not intended to replace advice given to you by your health care provider. Make sure you discuss any questions you have with your health care provider. °Breastfeeding °Deciding to breastfeed is one of the best choices you can make for you and your baby. A change in hormones during pregnancy causes your breast tissue to grow and increases the number and size of your milk ducts. These hormones also allow proteins, sugars, and fats from your blood supply to make breast milk in your milk-producing glands. Hormones prevent breast milk from being released before your baby is born as well as prompt milk flow after birth. Once breastfeeding has begun, thoughts of your baby, as well as his or her sucking or crying, can stimulate the release of milk from your milk-producing glands.  °BENEFITS OF BREASTFEEDING °For Your Baby °· Your first milk (colostrum) helps your baby's digestive system function better.   °· There are antibodies in your milk that help your baby fight off infections.   °· Your baby has a lower incidence of asthma, allergies, and sudden infant death syndrome.   °· The nutrients in breast milk are better for your baby than infant formulas and are designed uniquely for your baby's needs.   °· Breast milk improves your baby's brain development.   °· Your baby is less likely to develop other conditions, such as childhood obesity, asthma, or type 2 diabetes mellitus.    °For You  °· Breastfeeding helps to create a very special bond between you and your baby.   °· Breastfeeding is convenient. Breast milk is always available at the correct temperature and costs nothing.   °· Breastfeeding helps to burn calories and helps you lose the weight gained during pregnancy.   °· Breastfeeding makes your uterus contract to its prepregnancy size faster and slows bleeding (lochia) after you give birth.   °· Breastfeeding helps to lower your risk of developing type 2 diabetes mellitus, osteoporosis, and breast or ovarian cancer later in life. °SIGNS THAT YOUR BABY IS HUNGRY °Early Signs of Hunger  °· Increased alertness or activity. °· Stretching. °· Movement of the head from side to side. °· Movement of the head and opening of the mouth when the corner of the mouth or cheek is stroked (rooting). °· Increased sucking sounds, smacking lips, cooing, sighing, or squeaking. °· Hand-to-mouth movements. °· Increased sucking of fingers or hands. °Late Signs of Hunger °· Fussing. °· Intermittent crying. °Extreme Signs of Hunger °Signs of extreme hunger will require calming and consoling before your baby will be able to breastfeed successfully. Do not wait for the following signs of extreme hunger to occur before you initiate breastfeeding:   °·   Restlessness. °· A loud, strong cry. °·  Screaming. °BREASTFEEDING BASICS °Breastfeeding Initiation °· Find a comfortable place to sit or lie down, with your neck and back well supported. °· Place a pillow or rolled up blanket under your baby to bring him or her to the level of your breast (if you are seated). Nursing pillows are specially designed to help support your arms and your baby while you breastfeed. °· Make sure that your baby's abdomen is facing your abdomen.   °· Gently massage your breast. With your fingertips, massage from your chest wall toward your nipple in a circular motion. This encourages milk flow. You may need to continue this action during  the feeding if your milk flows slowly. °· Support your breast with 4 fingers underneath and your thumb above your nipple. Make sure your fingers are well away from your nipple and your baby's mouth.   °· Stroke your baby's lips gently with your finger or nipple.   °· When your baby's mouth is open wide enough, quickly bring your baby to your breast, placing your entire nipple and as much of the colored area around your nipple (areola) as possible into your baby's mouth.   °¨ More areola should be visible above your baby's upper lip than below the lower lip.   °¨ Your baby's tongue should be between his or her lower gum and your breast.   °· Ensure that your baby's mouth is correctly positioned around your nipple (latched). Your baby's lips should create a seal on your breast and be turned out (everted). °· It is common for your baby to suck about 2-3 minutes in order to start the flow of breast milk. °Latching °Teaching your baby how to latch on to your breast properly is very important. An improper latch can cause nipple pain and decreased milk supply for you and poor weight gain in your baby. Also, if your baby is not latched onto your nipple properly, he or she may swallow some air during feeding. This can make your baby fussy. Burping your baby when you switch breasts during the feeding can help to get rid of the air. However, teaching your baby to latch on properly is still the best way to prevent fussiness from swallowing air while breastfeeding. °Signs that your baby has successfully latched on to your nipple:    °· Silent tugging or silent sucking, without causing you pain.   °· Swallowing heard between every 3-4 sucks.   °·  Muscle movement above and in front of his or her ears while sucking.   °Signs that your baby has not successfully latched on to nipple:  °· Sucking sounds or smacking sounds from your baby while breastfeeding. °· Nipple pain. °If you think your baby has not latched on correctly, slip  your finger into the corner of your baby's mouth to break the suction and place it between your baby's gums. Attempt breastfeeding initiation again. °Signs of Successful Breastfeeding °Signs from your baby:   °· A gradual decrease in the number of sucks or complete cessation of sucking.   °· Falling asleep.   °· Relaxation of his or her body.   °· Retention of a small amount of milk in his or her mouth.   °· Letting go of your breast by himself or herself. °Signs from you: °· Breasts that have increased in firmness, weight, and size 1-3 hours after feeding.   °· Breasts that are softer immediately after breastfeeding. °· Increased milk volume, as well as a change in milk consistency and color by the fifth day of   breastfeeding.   °· Nipples that are not sore, cracked, or bleeding. °Signs That Your Baby is Getting Enough Milk °· Wetting at least 3 diapers in a 24-hour period. The urine should be clear and pale yellow by age 5 days. °· At least 3 stools in a 24-hour period by age 5 days. The stool should be soft and yellow. °· At least 3 stools in a 24-hour period by age 7 days. The stool should be seedy and yellow. °· No loss of weight greater than 10% of birth weight during the first 3 days of age. °· Average weight gain of 4-7 ounces (113-198 g) per week after age 4 days. °· Consistent daily weight gain by age 5 days, without weight loss after the age of 2 weeks. °After a feeding, your baby may spit up a small amount. This is common. °BREASTFEEDING FREQUENCY AND DURATION °Frequent feeding will help you make more milk and can prevent sore nipples and breast engorgement. Breastfeed when you feel the need to reduce the fullness of your breasts or when your baby shows signs of hunger. This is called "breastfeeding on demand." Avoid introducing a pacifier to your baby while you are working to establish breastfeeding (the first 4-6 weeks after your baby is born). After this time you may choose to use a pacifier. Research  has shown that pacifier use during the first year of a baby's life decreases the risk of sudden infant death syndrome (SIDS). °Allow your baby to feed on each breast as long as he or she wants. Breastfeed until your baby is finished feeding. When your baby unlatches or falls asleep while feeding from the first breast, offer the second breast. Because newborns are often sleepy in the first few weeks of life, you may need to awaken your baby to get him or her to feed. °Breastfeeding times will vary from baby to baby. However, the following rules can serve as a guide to help you ensure that your baby is properly fed: °· Newborns (babies 4 weeks of age or younger) may breastfeed every 1-3 hours. °· Newborns should not go longer than 3 hours during the day or 5 hours during the night without breastfeeding. °· You should breastfeed your baby a minimum of 8 times in a 24-hour period until you begin to introduce solid foods to your baby at around 6 months of age. °BREAST MILK PUMPING °Pumping and storing breast milk allows you to ensure that your baby is exclusively fed your breast milk, even at times when you are unable to breastfeed. This is especially important if you are going back to work while you are still breastfeeding or when you are not able to be present during feedings. Your lactation consultant can give you guidelines on how long it is safe to store breast milk.  °A breast pump is a machine that allows you to pump milk from your breast into a sterile bottle. The pumped breast milk can then be stored in a refrigerator or freezer. Some breast pumps are operated by hand, while others use electricity. Ask your lactation consultant which type will work best for you. Breast pumps can be purchased, but some hospitals and breastfeeding support groups lease breast pumps on a monthly basis. A lactation consultant can teach you how to hand express breast milk, if you prefer not to use a pump.  °CARING FOR YOUR BREASTS  WHILE YOU BREASTFEED °Nipples can become dry, cracked, and sore while breastfeeding. The following recommendations can help keep your   breasts moisturized and healthy: °· Avoid using soap on your nipples.   °· Wear a supportive bra. Although not required, special nursing bras and tank tops are designed to allow access to your breasts for breastfeeding without taking off your entire bra or top. Avoid wearing underwire-style bras or extremely tight bras. °· Air dry your nipples for 3-4 minutes after each feeding.   °· Use only cotton bra pads to absorb leaked breast milk. Leaking of breast milk between feedings is normal.   °· Use lanolin on your nipples after breastfeeding. Lanolin helps to maintain your skin's normal moisture barrier. If you use pure lanolin, you do not need to wash it off before feeding your baby again. Pure lanolin is not toxic to your baby. You may also hand express a few drops of breast milk and gently massage that milk into your nipples and allow the milk to air dry. °In the first few weeks after giving birth, some women experience extremely full breasts (engorgement). Engorgement can make your breasts feel heavy, warm, and tender to the touch. Engorgement peaks within 3-5 days after you give birth. The following recommendations can help ease engorgement: °· Completely empty your breasts while breastfeeding or pumping. You may want to start by applying warm, moist heat (in the shower or with warm water-soaked hand towels) just before feeding or pumping. This increases circulation and helps the milk flow. If your baby does not completely empty your breasts while breastfeeding, pump any extra milk after he or she is finished. °· Wear a snug bra (nursing or regular) or tank top for 1-2 days to signal your body to slightly decrease milk production. °· Apply ice packs to your breasts, unless this is too uncomfortable for you. °· Make sure that your baby is latched on and positioned properly while  breastfeeding. °If engorgement persists after 48 hours of following these recommendations, contact your health care provider or a lactation consultant. °OVERALL HEALTH CARE RECOMMENDATIONS WHILE BREASTFEEDING °· Eat healthy foods. Alternate between meals and snacks, eating 3 of each per day. Because what you eat affects your breast milk, some of the foods may make your baby more irritable than usual. Avoid eating these foods if you are sure that they are negatively affecting your baby. °· Drink milk, fruit juice, and water to satisfy your thirst (about 10 glasses a day).   °· Rest often, relax, and continue to take your prenatal vitamins to prevent fatigue, stress, and anemia. °· Continue breast self-awareness checks. °· Avoid chewing and smoking tobacco. °· Avoid alcohol and drug use. °Some medicines that may be harmful to your baby can pass through breast milk. It is important to ask your health care provider before taking any medicine, including all over-the-counter and prescription medicine as well as vitamin and herbal supplements. °It is possible to become pregnant while breastfeeding. If birth control is desired, ask your health care provider about options that will be safe for your baby. °SEEK MEDICAL CARE IF:  °· You feel like you want to stop breastfeeding or have become frustrated with breastfeeding. °· You have painful breasts or nipples. °· Your nipples are cracked or bleeding. °· Your breasts are red, tender, or warm. °· You have a swollen area on either breast. °· You have a fever or chills. °· You have nausea or vomiting. °· You have drainage other than breast milk from your nipples. °· Your breasts do not become full before feedings by the fifth day after you give birth. °· You feel sad and   depressed. °· Your baby is too sleepy to eat well. °· Your baby is having trouble sleeping.   °· Your baby is wetting less than 3 diapers in a 24-hour period. °· Your baby has less than 3 stools in a 24-hour  period. °· Your baby's skin or the white part of his or her eyes becomes yellow.   °· Your baby is not gaining weight by 5 days of age. °SEEK IMMEDIATE MEDICAL CARE IF:  °· Your baby is overly tired (lethargic) and does not want to wake up and feed. °· Your baby develops an unexplained fever. °Document Released: 03/30/2005 Document Revised: 04/04/2013 Document Reviewed: 09/21/2012 °[employer]® Patient Information ©2015 [employer]. This information is not intended to replace advice given to you by your health care provider. Make sure you discuss any questions you have with your health care provider. ° °

## 2014-01-28 NOTE — Lactation Note (Signed)
This note was copied from the chart of Girl Irven [place]icolle Boyack. Lactation Consultation Note  Patient Name: Girl Irven [place] Esteban WUJWJ'XToday's Date: 01/28/2014 Reason for consult: Follow-up assessment Per mom breast feeding is going well both breast , nipples are a little tender,breast are filling and leaking. LC instructed mom on the use hand pump ( increased flange #27 ), comfort gels. Reviewed sore nipple and engorgement prevention and tx. Referring to the Baby and me booklet.  Mother informed of post-discharge support and given phone number to the lactation department, including services for  phone call assistance; out-patient appointments; and breastfeeding support group. List of other breastfeeding resources in  the community given in the handout. Encouraged mother to call for problems or concerns related to breastfeeding.   Maternal Data    Feeding Feeding Type: Breast Fed Length of feed: 17 min (permom )  LATCH Score/Interventions                Intervention(s): Breastfeeding basics reviewed     Lactation Tools Discussed/Used Tools: Pump;Flanges;Comfort gels Flange Size: 27 Breast pump type: Manual   Consult Status Consult Status: Complete Date: 01/28/14    Kathrin Greathouseorio, Kavitha Lansdale Ann 01/28/2014, 10:54 AM

## 2014-01-28 NOTE — Discharge Summary (Signed)
Attestation of Attending Supervision of Advanced Practitioner (CNM/NP): Evaluation and management procedures were performed by the Advanced Practitioner under my supervision and collaboration.  I have reviewed the Advanced Practitioner's note and chart, and I agree with the management and plan.  Jamian Andujo 01/28/2014 7:52 AM   

## 2014-01-29 NOTE — Progress Notes (Signed)
Post discharge chart review completed.  

## 2014-01-31 ENCOUNTER — Encounter: Payer: Self-pay | Admitting: Advanced Practice Midwife

## 2014-02-12 ENCOUNTER — Encounter (HOSPITAL_COMMUNITY): Payer: Self-pay | Admitting: Obstetrics and Gynecology

## 2014-02-13 ENCOUNTER — Encounter (HOSPITAL_COMMUNITY): Payer: Self-pay | Admitting: Obstetrics and Gynecology

## 2014-02-13 NOTE — Addendum Note (Signed)
Addendum  created 02/13/14 1922 by Dana AllanAmy Jawad Wiacek, MD   Modules edited: Anesthesia Attestations, Anesthesia Events, Narrator   Narrator:  Narrator: Event Log Edited

## 2014-03-12 ENCOUNTER — Encounter: Payer: Self-pay | Admitting: Family Medicine

## 2014-03-12 ENCOUNTER — Ambulatory Visit (INDEPENDENT_AMBULATORY_CARE_PROVIDER_SITE_OTHER): Payer: Medicaid Other | Admitting: Family Medicine

## 2014-03-12 DIAGNOSIS — Z30430 Encounter for insertion of intrauterine contraceptive device: Secondary | ICD-10-CM

## 2014-03-12 LAB — POCT PREGNANCY, URINE: PREG TEST UR: NEGATIVE

## 2014-03-12 MED ORDER — LEVONORGESTREL 20 MCG/24HR IU IUD
INTRAUTERINE_SYSTEM | Freq: Once | INTRAUTERINE | Status: AC
  Administered 2014-03-12: 1 via INTRAUTERINE

## 2014-03-12 NOTE — Addendum Note (Signed)
Addended by: Kathee DeltonHILLMAN, Valine Drozdowski L on: 03/12/2014 03:59 PM   Modules accepted: Orders

## 2014-03-12 NOTE — Progress Notes (Signed)
  Subjective:     Veronica Boyle is a 23 y.o. female who presents for a postpartum visit. She is 6 weeks postpartum following a low cervical transverse Cesarean section. I have fully reviewed the prenatal and intrapartum course. The delivery was at 37 gestational weeks. Outcome: repeat cesarean section, low transverse incision. Anesthesia: epidural. Postpartum course has been Normal. Baby's course has been normal. Baby is feeding by breast. Bleeding no bleeding. Bowel function is normal. Bladder function is normal. Patient is not sexually active. Contraception method is IUD. Postpartum depression screening: negative.  The following portions of the patient's history were reviewed and updated as appropriate: allergies, current medications, past family history, past medical history, past social history, past surgical history and problem list.  Review of Systems Pertinent items are noted in HPI.   Objective:    BP 112/81 mmHg  Pulse 82  Wt 253 lb 11.2 oz (115.078 kg)  LMP 03/05/2014 (Exact Date)  Breastfeeding? Yes  General:  alert, cooperative and no distress  Lungs: clear to auscultation bilaterally  Heart:  regular rate and rhythm, S1, S2 normal, no murmur, click, rub or gallop  Abdomen: soft, non-tender; bowel sounds normal; no masses,  no organomegaly   Vulva:  normal  Vagina: normal vagina, no discharge, exudate, lesion, or erythema  Cervix:  nabothian cyst  Corpus: normal size, contour, position, consistency, mobility, non-tender  Adnexa:  normal adnexa  Rectal Exam: Not performed.        Assessment:     6 week postpartum exam. Pap smear not done at today's visit.   Plan:    1. Contraception: IUD 2. Follow up in: 1 month or as needed.     IUD Procedure Note Patient identified, informed consent performed, signed copy in chart, time out was performed.  Urine pregnancy test negative.  Speculum placed in the vagina.  Cervix visualized.  Cleaned with Betadine x 2.  Grasped  anteriorly with a single tooth tenaculum.  Uterus sounded to 8 cm.  Mirena IUD placed per manufacturer's recommendations.  Strings trimmed to 3 cm. Tenaculum was removed, good hemostasis noted.  Patient tolerated procedure well.   Patient given post procedure instructions and Mirena care card with expiration date.  Patient is asked to check IUD strings periodically and follow up in 4-6 weeks for IUD check.

## 2014-03-29 ENCOUNTER — Encounter: Payer: Self-pay | Admitting: *Deleted

## 2014-04-19 ENCOUNTER — Ambulatory Visit (INDEPENDENT_AMBULATORY_CARE_PROVIDER_SITE_OTHER): Payer: Medicaid Other | Admitting: Obstetrics & Gynecology

## 2014-04-19 ENCOUNTER — Encounter: Payer: Self-pay | Admitting: Obstetrics & Gynecology

## 2014-04-19 VITALS — BP 116/66 | HR 99 | Temp 97.8°F | Wt 256.4 lb

## 2014-04-19 DIAGNOSIS — Z30431 Encounter for routine checking of intrauterine contraceptive device: Secondary | ICD-10-CM

## 2014-04-19 NOTE — Patient Instructions (Signed)
Levonorgestrel intrauterine device (IUD) What is this medicine? LEVONORGESTREL IUD (LEE voe nor jes trel) is a contraceptive (birth control) device. The device is placed inside the uterus by a healthcare professional. It is used to prevent pregnancy and can also be used to treat heavy bleeding that occurs during your period. Depending on the device, it can be used for 3 to 5 years. This medicine may be used for other purposes; ask your health care provider or pharmacist if you have questions. COMMON BRAND NAME(S): LILETTA, Mirena, Skyla What should I tell my health care provider before I take this medicine? They need to know if you have any of these conditions: -abnormal Pap smear -cancer of the breast, uterus, or cervix -diabetes -endometritis -genital or pelvic infection now or in the past -have more than one sexual partner or your partner has more than one partner -heart disease -history of an ectopic or tubal pregnancy -immune system problems -IUD in place -liver disease or tumor -problems with blood clots or take blood-thinners -use intravenous drugs -uterus of unusual shape -vaginal bleeding that has not been explained -an unusual or allergic reaction to levonorgestrel, other hormones, silicone, or polyethylene, medicines, foods, dyes, or preservatives -pregnant or trying to get pregnant -breast-feeding How should I use this medicine? This device is placed inside the uterus by a health care professional. Talk to your pediatrician regarding the use of this medicine in children. Special care may be needed. Overdosage: If you think you have taken too much of this medicine contact a poison control center or emergency room at once. NOTE: This medicine is only for you. Do not share this medicine with others. What if I miss a dose? This does not apply. What may interact with this medicine? Do not take this medicine with any of the following  medications: -amprenavir -bosentan -fosamprenavir This medicine may also interact with the following medications: -aprepitant -barbiturate medicines for inducing sleep or treating seizures -bexarotene -griseofulvin -medicines to treat seizures like carbamazepine, ethotoin, felbamate, oxcarbazepine, phenytoin, topiramate -modafinil -pioglitazone -rifabutin -rifampin -rifapentine -some medicines to treat HIV infection like atazanavir, indinavir, lopinavir, nelfinavir, tipranavir, ritonavir -St. John's wort -warfarin This list may not describe all possible interactions. Give your health care provider a list of all the medicines, herbs, non-prescription drugs, or dietary supplements you use. Also tell them if you smoke, drink alcohol, or use illegal drugs. Some items may interact with your medicine. What should I watch for while using this medicine? Visit your doctor or health care professional for regular check ups. See your doctor if you or your partner has sexual contact with others, becomes HIV positive, or gets a sexual transmitted disease. This product does not protect you against HIV infection (AIDS) or other sexually transmitted diseases. You can check the placement of the IUD yourself by reaching up to the top of your vagina with clean fingers to feel the threads. Do not pull on the threads. It is a good habit to check placement after each menstrual period. Call your doctor right away if you feel more of the IUD than just the threads or if you cannot feel the threads at all. The IUD may come out by itself. You may become pregnant if the device comes out. If you notice that the IUD has come out use a backup birth control method like condoms and call your health care provider. Using tampons will not change the position of the IUD and are okay to use during your period. What side effects may   I notice from receiving this medicine? Side effects that you should report to your doctor or  health care professional as soon as possible: -allergic reactions like skin rash, itching or hives, swelling of the face, lips, or tongue -fever, flu-like symptoms -genital sores -high blood pressure -no menstrual period for 6 weeks during use -pain, swelling, warmth in the leg -pelvic pain or tenderness -severe or sudden headache -signs of pregnancy -stomach cramping -sudden shortness of breath -trouble with balance, talking, or walking -unusual vaginal bleeding, discharge -yellowing of the eyes or skin Side effects that usually do not require medical attention (report to your doctor or health care professional if they continue or are bothersome): -acne -breast pain -change in sex drive or performance -changes in weight -cramping, dizziness, or faintness while the device is being inserted -headache -irregular menstrual bleeding within first 3 to 6 months of use -nausea This list may not describe all possible side effects. Call your doctor for medical advice about side effects. You may report side effects to FDA at 1-800-FDA-1088. Where should I keep my medicine? This does not apply. NOTE: This sheet is a summary. It may not cover all possible information. If you have questions about this medicine, talk to your doctor, pharmacist, or health care provider.  2015, Elsevier/Gold Standard. (2011-04-30 13:54:04)  

## 2014-04-19 NOTE — Progress Notes (Signed)
Patient ID: Veronica Boyle, female   DOB: [birth year], 24 y.o.   MRN: [id number] History:  24 y.o. [id number] here today for today for IUD string check; Mirena IUD was placed 03/12/2014. No complaints about the Mirena, no concerning side effects.  The following portions of the patient's history were reviewed and updated as appropriate: allergies, current medications, past family history, past medical history, past social history, past surgical history and problem list.   Review of Systems:  Pertinent items are noted in HPI.  Objective:  Physical Exam Blood pressure 116/66, pulse 99, temperature 97.8 F (36.6 C), weight 256 lb 6.4 oz (116.302 kg), currently breastfeeding. Gen: NAD Abd: Soft, nontender and nondistended Pelvic: Normal appearing external genitalia; normal appearing vaginal mucosa and cervix.  IUD strings visualized, about 6 cm in length outside cervix.  Strings trimmed  Assessment & Plan:  Normal IUD check. Patient to keep IUD in place for five years; can come in for removal if she desires pregnancy within the next five years. Routine preventative health maintenance measures emphasized.

## 2014-11-15 DIAGNOSIS — R002 Palpitations: Secondary | ICD-10-CM

## 2014-11-15 DIAGNOSIS — F488 Other specified nonpsychotic mental disorders: Secondary | ICD-10-CM | POA: Insufficient documentation

## 2014-11-15 DIAGNOSIS — I471 Supraventricular tachycardia, unspecified: Secondary | ICD-10-CM | POA: Insufficient documentation

## 2014-11-15 DIAGNOSIS — R0789 Other chest pain: Secondary | ICD-10-CM | POA: Insufficient documentation

## 2014-11-15 DIAGNOSIS — F419 Anxiety disorder, unspecified: Secondary | ICD-10-CM | POA: Insufficient documentation

## 2014-11-15 HISTORY — DX: Supraventricular tachycardia, unspecified: I47.10

## 2014-11-15 HISTORY — DX: Anxiety disorder, unspecified: F41.9

## 2014-11-15 HISTORY — DX: Other specified nonpsychotic mental disorders: F48.8

## 2014-11-15 HISTORY — DX: Palpitations: R00.2

## 2014-11-15 HISTORY — DX: Other chest pain: R07.89

## 2014-11-15 HISTORY — DX: Supraventricular tachycardia: I47.1

## 2014-11-29 ENCOUNTER — Ambulatory Visit: Payer: Self-pay | Admitting: Obstetrics & Gynecology

## 2016-03-16 ENCOUNTER — Encounter: Payer: Self-pay | Admitting: Obstetrics & Gynecology

## 2016-03-16 ENCOUNTER — Ambulatory Visit (INDEPENDENT_AMBULATORY_CARE_PROVIDER_SITE_OTHER): Payer: Medicaid Other | Admitting: Obstetrics & Gynecology

## 2016-03-16 VITALS — BP 128/66 | HR 80 | Wt 276.2 lb

## 2016-03-16 DIAGNOSIS — R1011 Right upper quadrant pain: Secondary | ICD-10-CM | POA: Diagnosis present

## 2016-03-16 DIAGNOSIS — K117 Disturbances of salivary secretion: Secondary | ICD-10-CM | POA: Diagnosis not present

## 2016-03-16 DIAGNOSIS — Z30431 Encounter for routine checking of intrauterine contraceptive device: Secondary | ICD-10-CM | POA: Diagnosis not present

## 2016-03-16 DIAGNOSIS — K219 Gastro-esophageal reflux disease without esophagitis: Secondary | ICD-10-CM

## 2016-03-16 DIAGNOSIS — R42 Dizziness and giddiness: Secondary | ICD-10-CM

## 2016-03-16 DIAGNOSIS — Z3202 Encounter for pregnancy test, result negative: Secondary | ICD-10-CM

## 2016-03-16 LAB — CBC WITH DIFFERENTIAL/PLATELET
Basophils Absolute: 0 cells/uL (ref 0–200)
Basophils Relative: 0 %
Eosinophils Absolute: 430 cells/uL (ref 15–500)
Eosinophils Relative: 5 %
HEMATOCRIT: 40 % (ref 35.0–45.0)
HEMOGLOBIN: 13.3 g/dL (ref 11.7–15.5)
LYMPHS ABS: 2666 {cells}/uL (ref 850–3900)
Lymphocytes Relative: 31 %
MCH: 30.2 pg (ref 27.0–33.0)
MCHC: 33.3 g/dL (ref 32.0–36.0)
MCV: 90.7 fL (ref 80.0–100.0)
MONO ABS: 602 {cells}/uL (ref 200–950)
MPV: 10.8 fL (ref 7.5–12.5)
Monocytes Relative: 7 %
NEUTROS PCT: 57 %
Neutro Abs: 4902 cells/uL (ref 1500–7800)
Platelets: 314 10*3/uL (ref 140–400)
RBC: 4.41 MIL/uL (ref 3.80–5.10)
RDW: 13.2 % (ref 11.0–15.0)
WBC: 8.6 10*3/uL (ref 3.8–10.8)

## 2016-03-16 LAB — POCT PREGNANCY, URINE: PREG TEST UR: NEGATIVE

## 2016-03-16 LAB — TSH: TSH: 3.22 m[IU]/L

## 2016-03-16 MED ORDER — FAMOTIDINE 20 MG PO TABS: 20.0000 mg | ORAL_TABLET | Freq: Two times a day (BID) | ORAL | 3 refills | Status: DC

## 2016-03-16 NOTE — Patient Instructions (Signed)

## 2016-03-16 NOTE — Progress Notes (Signed)
History:  25 y.o. [id number] here today for eval of her IUD.  She reports that for the past month she has had RUQ pain. She reports nausea and dizziness and excessive salivation. She has been fasting intermittently for religious reasons.  She was concerned that these sx were related to her lnIUD. Pt reports having IUD for 2 years. She recently had a normal cycle but, in general, has amenorrhea.  She has no pelvic sx.  Pt reports a weight gain of > 20#'s in the recent past. 07/31/2013 weight 240# today 276#'s  The following portions of the patient's history were reviewed and updated as appropriate: allergies, current medications, past family history, past medical history, past social history, past surgical history and problem list.  Review of Systems:  Pertinent items are noted in HPI.   Objective:  Physical Exam Blood pressure 128/66, pulse 80, weight 276 lb 3.2 oz (125.3 kg), last menstrual period 03/06/2016, not currently breastfeeding. BP 128/66   Pulse 80   Wt 276 lb 3.2 oz (125.3 kg)   LMP 03/06/2016 (Exact Date)   Breastfeeding? No   BMI 44.58 kg/m  CONSTITUTIONAL: Well-developed, well-nourished female in no acute distress.  HENT:  Normocephalic, atraumatic EYES: Conjunctivae and EOM are normal. No scleral icterus.  NECK: Normal range of motion SKIN: Skin is warm and dry. No rash noted. Not diaphoretic.No pallor. NEUROLGIC: Alert and oriented to person, place, and time. Normal coordination.  Abd: obese, Soft, +RUQ tenderness and mid epigastric tenderness.  Her abd is nondistended Pelvic: not done     Assessment & Plan:  Concerns of IUD- I doubt a relationship of current sx and LnIUD. RUQ pain and excessive salivation possible related to GERD. Ned to eval for gallbladder ds  Dizziness- pt has gained weight. She has not had a recent screen for DM.  Will eval   TSH and HgbA1c  Rec Pepcid 20mg  bid and f/u in 4 weeks or sooner prn  Rollande Thursby L. Harraway-Smith, M.D.,  [facility]   Udell Blasingame L. Harraway-Smith, M.D., [facility]

## 2016-03-17 LAB — HEMOGLOBIN A1C
Hgb A1c MFr Bld: 5.4 % (ref <5.7)
Mean Plasma Glucose: 108 mg/dL

## 2016-03-18 ENCOUNTER — Telehealth: Payer: Self-pay | Admitting: Obstetrics & Gynecology

## 2016-03-18 ENCOUNTER — Telehealth: Payer: Self-pay

## 2016-03-18 NOTE — Telephone Encounter (Signed)
Notified pt of normal results from visit of 03/16/16.  Pt did not have any other questions.

## 2016-03-18 NOTE — Telephone Encounter (Signed)
Patient called about her results, and has checked her MyChart still nothing.

## 2016-03-18 NOTE — Telephone Encounter (Signed)
Pt called requesting test results. 

## 2016-03-20 ENCOUNTER — Ambulatory Visit (HOSPITAL_COMMUNITY): Payer: Medicaid Other

## 2016-03-23 ENCOUNTER — Telehealth: Payer: Self-pay | Admitting: General Practice

## 2016-03-23 NOTE — Telephone Encounter (Signed)
Called patient, no answer-left message stating we are trying to reach you to inform you that your most recent labs were negative. You may call us back if you have questions

## 2016-03-26 ENCOUNTER — Ambulatory Visit (HOSPITAL_COMMUNITY)
Admission: RE | Admit: 2016-03-26 | Discharge: 2016-03-26 | Disposition: A | Discharge status: Home / Self Care | Payer: Medicaid Other | Source: Ambulatory Visit | Attending: Obstetrics & Gynecology | Admitting: Obstetrics & Gynecology

## 2016-03-26 DIAGNOSIS — R1011 Right upper quadrant pain: Secondary | ICD-10-CM | POA: Diagnosis not present

## 2016-03-26 DIAGNOSIS — K117 Disturbances of salivary secretion: Secondary | ICD-10-CM

## 2016-03-30 ENCOUNTER — Telehealth: Payer: Self-pay | Admitting: *Deleted

## 2016-03-30 NOTE — Telephone Encounter (Signed)
Called Veronica Boyle and left message on her personal voice mail stating that Dr. Erin FullingHarraway-Smith has reviewed her ultrasound result. It was normal with no problems. She may call back if she has questions.

## 2016-06-08 ENCOUNTER — Ambulatory Visit: Payer: Self-pay | Admitting: Obstetrics and Gynecology

## 2016-06-08 ENCOUNTER — Encounter: Payer: Self-pay | Admitting: Obstetrics and Gynecology

## 2016-06-08 NOTE — Progress Notes (Signed)
Patient did not keep GYN appointment for 06/08/2016.  Veronica Boyle, Jr MD Attending [facility])

## 2017-01-27 ENCOUNTER — Ambulatory Visit (INDEPENDENT_AMBULATORY_CARE_PROVIDER_SITE_OTHER): Payer: Medicaid Other | Admitting: Student

## 2017-01-27 ENCOUNTER — Encounter: Payer: Self-pay | Admitting: Student

## 2017-01-27 ENCOUNTER — Other Ambulatory Visit (HOSPITAL_COMMUNITY)
Admission: RE | Admit: 2017-01-27 | Discharge: 2017-01-27 | Disposition: A | Discharge status: Home / Self Care | Payer: Medicaid Other | Source: Ambulatory Visit | Attending: Student | Admitting: Student

## 2017-01-27 VITALS — BP 119/79 | HR 80 | Ht 66.0 in | Wt 273.7 lb

## 2017-01-27 DIAGNOSIS — Z0000 Encounter for general adult medical examination without abnormal findings: Secondary | ICD-10-CM

## 2017-01-27 DIAGNOSIS — Z131 Encounter for screening for diabetes mellitus: Secondary | ICD-10-CM

## 2017-01-27 DIAGNOSIS — Z30432 Encounter for removal of intrauterine contraceptive device: Secondary | ICD-10-CM

## 2017-01-27 DIAGNOSIS — Z01419 Encounter for gynecological examination (general) (routine) without abnormal findings: Secondary | ICD-10-CM | POA: Diagnosis not present

## 2017-01-27 DIAGNOSIS — Z124 Encounter for screening for malignant neoplasm of cervix: Secondary | ICD-10-CM | POA: Diagnosis not present

## 2017-01-27 MED ORDER — IBUPROFEN 200 MG PO TABS
600.0000 mg | ORAL_TABLET | Freq: Once | ORAL | Status: AC
Start: 2017-01-27 — End: 2017-01-27
  Administered 2017-01-27: 600 mg via ORAL

## 2017-01-27 NOTE — Progress Notes (Signed)
Subjective:     Patient ID: Veronica Boyle, female   DOB: [birth year], 26 y.o.   MRN: [id number]  HPI Patient Veronica Boyle is a 26 y.o. [id number] here for IUD removal. She and her husband are planning to conceive. She denies any other ob-gyn compaints.   Review of Systems  HENT: Negative.   Respiratory: Negative.   Cardiovascular: Negative.   Gastrointestinal: Negative.   Genitourinary: Negative.   Musculoskeletal: Negative.   Neurological: Negative.   Psychiatric/Behavioral: Negative.        Objective:   Physical Exam  Constitutional: She is oriented to person, place, and time. She appears well-developed and well-nourished.  HENT:  Head: Normocephalic.  Neck: Normal range of motion.  Cardiovascular: Normal rate.   Pulmonary/Chest: Effort normal.  Abdominal: Soft.  Genitourinary: Vagina normal.  Genitourinary Comments: NEFG; cervix is pink with no lesions. IUD removal done (see separate note). No CMT, no suprapubic or adnexal tenderness.   Musculoskeletal: Normal range of motion.  Neurological: She is alert and oriented to person, place, and time. She has normal reflexes.  Skin: Skin is warm and dry.  Psychiatric: She has a normal mood and affect.  Breast exam: Benign, no lumps or masses palpated.      Assessment:     1. Screening for diabetes mellitus   2. Annual physical exam   3. Encounter for IUD removal    Procedure note. Time-out performed. Cervix cleaned with betadine and tenaculum placed. IUD forcep inserted and IUD removed with gentle traction. IUD intact. Patient tolerate procedure well; minimal bleeding after procedure.     Plan:        1. Begin folic acid.  2. Diabetic screening. 3.  Pap smear done. 4. IUD removed (see procedure note)  Luna KitchensKathryn Nakia Remmers CNM

## 2017-01-27 NOTE — Patient Instructions (Signed)
Chewable Multivitamin with Minerals and Iron formulations What is this medicine? CHEWABLE MULTIVITAMIN with MINERAL and IRON combinations are used to help provide good nutrition. This medicine may be used for other purposes; ask your health care provider or pharmacist if you have questions. COMMON BRAND NAME(S): Animal Shapes + Iron, Centrum Kid's, Cerovite Jr., Chewable Vite With Iron, Flintstones Complete, Fruity Chews with Iron, Multi Vita-Bets with 46m Fluoride and Iron, Multi Vita-Bets with Fluoride 0.233m Multi Vita-Bets with Fluoride 0.62m58mnd Iron, MultiChew, Multivitamin with Iron, One-A-Day Kid's, Poly-Vi-Flor with Iron, Prenatal 19, Strongstart Chewable, Vi-Daylin/F with Iron What should I tell my health care provider before I take this medicine? They need to know if you have any of these conditions: -bleeding or clotting disorder -history of anemia of any type -other chronic health condition -phenylketonuria -an unusual or allergic reaction to vitamins, minerals, other medicines, foods, dyes, or preservatives -pregnant or trying to get pregnant -breast-feeding How should I use this medicine? Take by mouth and chew throroughly before swallowing. May take with a glass of water, juice, or milk. May take with food. Follow the directions on the prescription or product label. The usual dose is one tablet once a day. Do not take your medicine more often than directed. This medicine is regularly used in children 4 y17ars of age and older; some products may be used in children as young as 2 y38ars of age. If your child is younger than 4 y78ars of age, it is recommended to check with your pediatrician prior to use. Special care may be needed. Overdosage: If you think you have taken too much of this medicine contact a poison control center or emergency room at once. NOTE: This medicine is only for you. Do not share this medicine with others. What if I miss a dose? If you miss a dose, take it as  soon as you can. If it is almost time for your next dose, take only that dose. Do not take double or extra doses. What may interact with this medicine? -antacids -cefdinir -cefditoren -etidronate -fluoroquinolone antibiotics (examples: ciprofloxacin, gatifloxacin, levofloxacin) -levodopa -tetracycline antibiotics (examples: doxycycline, minocycline, tetracycline) -thyroid hormones -warfarin This list may not describe all possible interactions. Give your health care provider a list of all the medicines, herbs, non-prescription drugs, or dietary supplements you use. Also tell them if you smoke, drink alcohol, or use illegal drugs. Some items may interact with your medicine. What should I watch for while using this medicine? Get regular checks on your progress. Remember that vitamin and mineral supplements do not replace the need for good nutrition from a balanced diet. Talk with your health care professional if you have questions or need advice. What side effects may I notice from receiving this medicine? Side effects that you should report to your doctor or health care professional as soon as possible: -allergic reaction such as skin rash or difficulty breathing -vomiting Side effects that usually do not require medical attention (report to your doctor or health care professional if they continue or are bothersome): -nausea -stomach upset This list may not describe all possible side effects. Call your doctor for medical advice about side effects. You may report side effects to FDA at 1-800-FDA-1088. Where should I keep my medicine? Keep out of the reach of children. Most vitamins and minerals should be stored at controlled room temperature between 15 and 30 degrees C (59 and 86 degrees F). Check your specific product directions. Protect from heat and moisture. Throw away any unused  medicine after the expiration date. NOTE: This sheet is a summary. It may not cover all possible information. If  you have questions about this medicine, talk to your doctor, pharmacist, or health care provider.  2018 Elsevier/Gold Standard (2013-06-06 08:01:00)

## 2017-01-28 LAB — CYTOLOGY - PAP
Chlamydia: NEGATIVE
Diagnosis: NEGATIVE
NEISSERIA GONORRHEA: NEGATIVE

## 2017-01-28 LAB — HEMOGLOBIN A1C
Est. average glucose Bld gHb Est-mCnc: 111 mg/dL
Hgb A1c MFr Bld: 5.5 % (ref 4.8–5.6)

## 2018-11-24 ENCOUNTER — Ambulatory Visit: Payer: Medicaid Other | Admitting: Lactation Services

## 2018-11-24 ENCOUNTER — Other Ambulatory Visit (HOSPITAL_COMMUNITY)
Admission: RE | Admit: 2018-11-24 | Discharge: 2018-11-24 | Disposition: A | Discharge status: Home / Self Care | Payer: Medicaid Other | Source: Ambulatory Visit | Attending: Family Medicine | Admitting: Family Medicine

## 2018-11-24 ENCOUNTER — Other Ambulatory Visit: Payer: Self-pay

## 2018-11-24 DIAGNOSIS — N898 Other specified noninflammatory disorders of vagina: Secondary | ICD-10-CM | POA: Insufficient documentation

## 2018-11-24 NOTE — Progress Notes (Signed)
Pt reports she usually has a regular cycle about the 16 th of the month. Her last menstrual cycle was June 16 of this year. She has been trying to get pregnant for 2 years. She has her IUD taken out in 2018 Urine home Pregnancy tests have been negative. The last one was 2 weeks ago.   Pt reports she is having a lot of discharge and it looks like egg white, she does not have an abnormal odor. She is concerned about the amount she is having. She would like to check for yeast and BV.   Pt is experiencing cramping and back pain and is feeling nauseous at times, this is not normal for her she says. Urine pregnancy test obtained. UPT negative.   Pt needs follow up with Provider for conception counseling.

## 2018-11-25 LAB — POCT PREGNANCY, URINE: Preg Test, Ur: NEGATIVE

## 2018-11-26 LAB — CERVICOVAGINAL ANCILLARY ONLY
Bacterial vaginitis: NEGATIVE
Candida vaginitis: POSITIVE — AB
Chlamydia: NEGATIVE
Neisseria Gonorrhea: NEGATIVE
Trichomonas: NEGATIVE

## 2018-11-28 ENCOUNTER — Other Ambulatory Visit: Payer: Self-pay | Admitting: Family Medicine

## 2018-11-28 MED ORDER — FLUCONAZOLE 150 MG PO TABS: 150.0000 mg | ORAL_TABLET | Freq: Every day | ORAL | 2 refills | Status: DC

## 2018-12-14 ENCOUNTER — Encounter: Payer: Medicaid Other | Admitting: Obstetrics & Gynecology

## 2018-12-14 ENCOUNTER — Other Ambulatory Visit: Payer: Self-pay

## 2018-12-14 DIAGNOSIS — N898 Other specified noninflammatory disorders of vagina: Secondary | ICD-10-CM

## 2018-12-14 MED ORDER — FLUCONAZOLE 150 MG PO TABS: 150.0000 mg | ORAL_TABLET | Freq: Once | ORAL | 0 refills | Status: AC

## 2018-12-14 NOTE — Progress Notes (Signed)
Pt not seen at scheduled time wants to reschedule as she has to go back to work.

## 2019-01-02 ENCOUNTER — Telehealth (INDEPENDENT_AMBULATORY_CARE_PROVIDER_SITE_OTHER): Payer: Self-pay | Admitting: Advanced Practice Midwife

## 2019-01-02 ENCOUNTER — Encounter: Payer: Self-pay | Admitting: Advanced Practice Midwife

## 2019-01-02 ENCOUNTER — Other Ambulatory Visit: Payer: Self-pay

## 2019-01-02 DIAGNOSIS — N926 Irregular menstruation, unspecified: Secondary | ICD-10-CM

## 2019-01-02 NOTE — Progress Notes (Signed)
  Subjective:     Patient ID: Veronica Boyle, female   DOB: Aug 06, 1990, 28 y.o.   MRN: [id number]  Veronica Boyle is a 28 y.o. [id number] who is here today via video visit to discuss why she has not been able to get pregnant. She has been trying for about 2 years. She had an IUD taken out two years ago. She states that her periods were very light at first, but then became heavier. She states that they have been very regular since then. She has been trying to lose weight to see if that would help. She states that after the birth of her second child she gained about 100 lbs. Since beginning of the pandemic she gained about 30 lbs. Her and her partner have had two children together. She is not timing intercourse at this time. She is trying to have frequent intercourse.     Review of Systems  All other systems reviewed and are negative.      Objective:   Physical Exam Deferred due to tele-visit     Assessment:     1. Irregular menstrual cycle        Plan:   Start using menstrual tracking app Ovulation predictor test strips Will get labs FU after lab results.   Approx 35 mins spent with the patient in non face-to-face time.   Marcille Buffy DNP, CNM  01/02/19  3:34 PM

## 2019-01-02 NOTE — Progress Notes (Signed)
I connected with  [name] on 01/02/19 at  2:15 PM EDT by telephone and verified that I am speaking with the correct person using two identifiers.   I discussed the limitations, risks, security and privacy concerns of performing an evaluation and management service by telephone and the availability of in person appointments. I also discussed with the patient that there may be a patient responsible charge related to this service. The patient expressed understanding and agreed to proceed.  [place], CMA 01/02/2019  2:22 PM

## 2019-01-18 ENCOUNTER — Telehealth: Payer: Self-pay | Admitting: Family Medicine

## 2019-01-18 NOTE — Telephone Encounter (Signed)
Called the patient to confirm the appointment. The patient answered no to the covid19 screening questions. Also advised the patient of no children or visitors due to covid19 restrictions. °

## 2019-01-19 ENCOUNTER — Other Ambulatory Visit: Payer: Self-pay

## 2019-01-19 ENCOUNTER — Other Ambulatory Visit: Payer: Medicaid Other

## 2019-01-19 DIAGNOSIS — N926 Irregular menstruation, unspecified: Secondary | ICD-10-CM

## 2019-01-21 LAB — COMPREHENSIVE METABOLIC PANEL
ALT: 26 IU/L (ref 0–32)
AST: 21 IU/L (ref 0–40)
Albumin/Globulin Ratio: 1.4 (ref 1.2–2.2)
Albumin: 3.7 g/dL — ABNORMAL LOW (ref 3.9–5.0)
Alkaline Phosphatase: 63 IU/L (ref 39–117)
BUN/Creatinine Ratio: 11 (ref 9–23)
BUN: 6 mg/dL (ref 6–20)
Bilirubin Total: 0.3 mg/dL (ref 0.0–1.2)
CO2: 24 mmol/L (ref 20–29)
Calcium: 8.6 mg/dL — ABNORMAL LOW (ref 8.7–10.2)
Chloride: 102 mmol/L (ref 96–106)
Creatinine, Ser: 0.56 mg/dL — ABNORMAL LOW (ref 0.57–1.00)
GFR calc Af Amer: 149 mL/min/{1.73_m2} (ref >59)
GFR calc non Af Amer: 129 mL/min/{1.73_m2} (ref >59)
Globulin, Total: 2.7 g/dL (ref 1.5–4.5)
Glucose: 93 mg/dL (ref 65–99)
Potassium: 4.1 mmol/L (ref 3.5–5.2)
Sodium: 137 mmol/L (ref 134–144)
Total Protein: 6.4 g/dL (ref 6.0–8.5)

## 2019-01-21 LAB — CBC
Hematocrit: 37.4 % (ref 34.0–46.6)
Hemoglobin: 12.2 g/dL (ref 11.1–15.9)
MCH: 28.8 pg (ref 26.6–33.0)
MCHC: 32.6 g/dL (ref 31.5–35.7)
MCV: 88 fL (ref 79–97)
Platelets: 286 10*3/uL (ref 150–450)
RBC: 4.23 x10E6/uL (ref 3.77–5.28)
RDW: 12.5 % (ref 11.7–15.4)
WBC: 7 10*3/uL (ref 3.4–10.8)

## 2019-01-21 LAB — TESTOSTERONE,FREE AND TOTAL
Testosterone, Free: 2.7 pg/mL (ref 0.0–4.2)
Testosterone: 47 ng/dL (ref 8–48)

## 2019-01-21 LAB — LIPID PANEL
Chol/HDL Ratio: 4.8 ratio — ABNORMAL HIGH (ref 0.0–4.4)
Cholesterol, Total: 159 mg/dL (ref 100–199)
HDL: 33 mg/dL — ABNORMAL LOW (ref >39)
LDL Chol Calc (NIH): 84 mg/dL (ref 0–99)
Triglycerides: 255 mg/dL — ABNORMAL HIGH (ref 0–149)
VLDL Cholesterol Cal: 42 mg/dL — ABNORMAL HIGH (ref 5–40)

## 2019-01-21 LAB — THYROID PANEL WITH TSH
Free Thyroxine Index: 1.7 (ref 1.2–4.9)
T3 Uptake Ratio: 19 % — ABNORMAL LOW (ref 24–39)
T4, Total: 9.2 ug/dL (ref 4.5–12.0)
TSH: 1.85 u[IU]/mL (ref 0.450–4.500)

## 2019-02-06 NOTE — Telephone Encounter (Signed)
Opened in error

## 2019-02-14 ENCOUNTER — Other Ambulatory Visit: Payer: Self-pay

## 2019-02-14 ENCOUNTER — Telehealth (INDEPENDENT_AMBULATORY_CARE_PROVIDER_SITE_OTHER): Payer: Self-pay | Admitting: Advanced Practice Midwife

## 2019-02-14 DIAGNOSIS — Z3169 Encounter for other general counseling and advice on procreation: Secondary | ICD-10-CM

## 2019-02-14 DIAGNOSIS — Z712 Person consulting for explanation of examination or test findings: Secondary | ICD-10-CM

## 2019-02-14 MED ORDER — METFORMIN HCL 500 MG PO TABS: 500.0000 mg | ORAL_TABLET | Freq: Every day | ORAL | 5 refills | Status: DC

## 2019-02-14 NOTE — Progress Notes (Signed)
   TELEHEALTH VIRTUAL GYNECOLOGY VISIT ENCOUNTER NOTE  I connected with Veronica Boyle on 02/14/19 at 11:15 AM EST by telephone at home and verified that I am speaking with the correct person using two identifiers.   I discussed the limitations, risks, security and privacy concerns of performing an evaluation and management service by telephone and the availability of in person appointments. I also discussed with the patient that there may be a patient responsible charge related to this service. The patient expressed understanding and agreed to proceed.   History:  Veronica Boyle is a 28 y.o. [id number] female being evaluated today for unable to get pregnant. Would like to review results from prior visit. She has been tracking cycles and using OPK. She feels she is consistently getting on on time ovulation. . She denies any abnormal vaginal discharge, bleeding, pelvic pain or other concerns.       Past Medical History:  Diagnosis Date  . Arrhythmia   . SVT (supraventricular tachycardia) (HCC)    Past Surgical History:  Procedure Laterality Date  . CESAREAN SECTION    . CESAREAN SECTION N/A 01/25/2014   Procedure: CESAREAN SECTION;  Surgeon: Jonnie Kind, MD;  Location: [facility];  Service: Obstetrics;  Laterality: N/A;  . TONSILLECTOMY    . [place] EXTRACTION  2012   The following portions of the patient's history were reviewed and updated as appropriate: allergies, current medications, past family history, past medical history, past social history, past surgical history and problem list.   Health Maintenance:   Review of Systems:  Pertinent items noted in HPI and remainder of comprehensive ROS otherwise negative.  Physical Exam:   General:  Alert, oriented and cooperative.   Mental Status: Normal mood and affect perceived. Normal judgment and thought content.  Physical exam deferred due to nature of the encounter  Labs and Imaging No results found for this or any previous  visit (from the past 336 hour(s)). No results found.    Assessment and Plan:     1. Infertility counseling - Metformin 500mg  QD #30 with 5RF - FU for referral for infertility specialist if unable to achieve pregnancy  2. Encounter to discuss test results       I discussed the assessment and treatment plan with the patient. The patient was provided an opportunity to ask questions and all were answered. The patient agreed with the plan and demonstrated an understanding of the instructions.   The patient was advised to call back or seek an in-person evaluation/go to the ED if the symptoms worsen or if the condition fails to improve as anticipated.  I provided 12 minutes of non-face-to-face time during this encounter.   Marcille Buffy DNP, CNM  02/14/19  11:53 AM  Center for [facility]

## 2019-02-14 NOTE — Progress Notes (Signed)
I connected with  Veronica Boyle on 02/14/19 at 11:15 AM EST by telephone and verified that I am speaking with the correct person using two identifiers.   I discussed the limitations, risks, security and privacy concerns of performing an evaluation and management service by telephone and the availability of in person appointments. I also discussed with the patient that there may be a patient responsible charge related to this service. The patient expressed understanding and agreed to proceed.  [place], [place] 02/14/2019  11:20 AM

## 2019-06-20 ENCOUNTER — Ambulatory Visit (INDEPENDENT_AMBULATORY_CARE_PROVIDER_SITE_OTHER): Payer: Medicaid Other | Admitting: General Practice

## 2019-06-20 ENCOUNTER — Other Ambulatory Visit: Payer: Self-pay

## 2019-06-20 DIAGNOSIS — Z3491 Encounter for supervision of normal pregnancy, unspecified, first trimester: Secondary | ICD-10-CM

## 2019-06-20 DIAGNOSIS — O219 Vomiting of pregnancy, unspecified: Secondary | ICD-10-CM

## 2019-06-20 DIAGNOSIS — Z3201 Encounter for pregnancy test, result positive: Secondary | ICD-10-CM

## 2019-06-20 LAB — POCT PREGNANCY, URINE: Preg Test, Ur: POSITIVE — AB

## 2019-06-20 MED ORDER — PROMETHAZINE HCL 25 MG PO TABS: 25.0000 mg | ORAL_TABLET | Freq: Four times a day (QID) | ORAL | 0 refills | Status: DC | PRN

## 2019-06-20 NOTE — Progress Notes (Signed)
Patient seen and assessed by nursing staff during this encounter. I have reviewed the chart and agree with the documentation and plan.  Jaynie Collins, MD 06/20/2019 10:12 AM

## 2019-06-20 NOTE — Progress Notes (Signed)
Patient presents to office today for UPT. UPT + patient reports first positive test at home 2 weeks ago. She is unsure of LMP. Missed period in December had cycle 04/20/19 and several days of spotting 2/7. Around February period she also noticed back pain, breast tenderness, nausea, and food aversions. Will schedule ultrasound for dating. Patient reports taking PNV and requests Rx for nausea. Phenergan sent in per protocol. Patient reports concern about weight gain in past year and how that may affect pregnancy. Reviewed healthy diet and exercise options for pregnancy. Patient verbalized understanding and will return after ultrasound for results.  Chase Caller RN BSN 06/20/19

## 2019-07-03 ENCOUNTER — Ambulatory Visit (HOSPITAL_COMMUNITY)
Admission: RE | Admit: 2019-07-03 | Discharge: 2019-07-03 | Disposition: A | Discharge status: Home / Self Care | Payer: Medicaid Other | Source: Ambulatory Visit | Attending: Obstetrics & Gynecology | Admitting: Obstetrics & Gynecology

## 2019-07-03 ENCOUNTER — Other Ambulatory Visit: Payer: Self-pay

## 2019-07-03 ENCOUNTER — Encounter: Payer: Self-pay | Admitting: Family Medicine

## 2019-07-03 ENCOUNTER — Ambulatory Visit (INDEPENDENT_AMBULATORY_CARE_PROVIDER_SITE_OTHER): Payer: Self-pay

## 2019-07-03 ENCOUNTER — Other Ambulatory Visit: Payer: Self-pay | Admitting: Obstetrics & Gynecology

## 2019-07-03 DIAGNOSIS — Z3491 Encounter for supervision of normal pregnancy, unspecified, first trimester: Secondary | ICD-10-CM | POA: Diagnosis not present

## 2019-07-03 DIAGNOSIS — Z712 Person consulting for explanation of examination or test findings: Secondary | ICD-10-CM

## 2019-07-03 NOTE — Progress Notes (Signed)
Pt here today for [place] results following [place] for uncertain dating. US showed pt is 11w 4d with EDD of 01/18/20. [place] reviewed by Macon Large, MD who requests patient be scheduled for prenatal care.   Dating and provider recommendation given to pt. [place] photos given to pt. Reviewed medications and allergies with patient; list of medications safe to take during pregnancy given. Pt states she is taking OTC prenatal vitamin. I explained to pt that once her pregnancy medicaid has been approved our office will be able to prescribe prenatal vitamins if she would like. Pt states she discontinued Metformin when she found out she was pregnant; pt states she is prediabetic and our office prescribed this Metformin when she stated she was trying to conceive. Explained to pt she should continue this medication until her prenatal appt and discuss with provider at that time.   Front office notified to provide proof of pregnancy letter and to schedule new OB appts.   Fleet Contras RN 07/03/19

## 2019-07-04 NOTE — Progress Notes (Signed)
Patient seen and assessed by nursing staff during this encounter. I have reviewed the chart and agree with the documentation and plan.  Jaynie Collins, MD 07/04/2019 8:22 AM

## 2019-07-24 ENCOUNTER — Encounter: Payer: Self-pay | Admitting: Family Medicine

## 2019-07-25 ENCOUNTER — Telehealth: Payer: Self-pay | Admitting: *Deleted

## 2019-07-25 NOTE — Telephone Encounter (Addendum)
Pt called with questions regarding vaginal bleeding.   1005 Call returned to pt and discussed her concern. She reports passing some small clots (size of a pencil eraser) but no bleeding. She is also having sciatic pain. I advised pt that she may take Tylenol for the pain and may use a heating pad as well. Pt was advised to notify us if she has active vaginal bleeding or if the clots become larger. She voiced understanding and agreed.

## 2019-07-31 ENCOUNTER — Other Ambulatory Visit: Payer: Self-pay

## 2019-07-31 ENCOUNTER — Ambulatory Visit (INDEPENDENT_AMBULATORY_CARE_PROVIDER_SITE_OTHER): Payer: Medicaid Other | Admitting: *Deleted

## 2019-07-31 DIAGNOSIS — O99210 Obesity complicating pregnancy, unspecified trimester: Secondary | ICD-10-CM

## 2019-07-31 DIAGNOSIS — O09299 Supervision of pregnancy with other poor reproductive or obstetric history, unspecified trimester: Secondary | ICD-10-CM

## 2019-07-31 DIAGNOSIS — O0990 Supervision of high risk pregnancy, unspecified, unspecified trimester: Secondary | ICD-10-CM

## 2019-07-31 DIAGNOSIS — Z8759 Personal history of other complications of pregnancy, childbirth and the puerperium: Secondary | ICD-10-CM | POA: Insufficient documentation

## 2019-07-31 DIAGNOSIS — O34219 Maternal care for unspecified type scar from previous cesarean delivery: Secondary | ICD-10-CM

## 2019-07-31 HISTORY — DX: Personal history of other complications of pregnancy, childbirth and the puerperium: Z87.59

## 2019-07-31 MED ORDER — BLOOD PRESSURE KIT DEVI: 1.0000 | 0 refills | Status: DC | PRN

## 2019-07-31 MED ORDER — BLOOD PRESSURE KIT DEVI: 1.0000 | 0 refills | Status: AC | PRN

## 2019-07-31 NOTE — Patient Instructions (Signed)

## 2019-07-31 NOTE — Progress Notes (Signed)
I connected with  Veronica Boyle on 07/31/19 at  9:30 AM EDT by telephone and verified that I am speaking with the correct person using two identifiers.   I discussed the limitations, risks, security and privacy concerns of performing an evaluation and management service by telephone and the availability of in person appointments. I also discussed with the patient that there may be a patient responsible charge related to this service. The patient expressed understanding and agreed to proceed.  I explained I am completing her New OB Intake today. We discussed Her EDD and that it is based on  Early [place] . I reviewed her allergies, meds, OB History, Medical /Surgical history, and appropriate screenings. I informed her of [facility] services.  I explained I will send her the Babys[employer]s app and app was sent to her while on phone.  I explained we will send a blood pressure cuff to [facility] that will fill that prescription and we called [facility] to verify they received her prescription and confirmed they will deliver to the office . Then at her new ob appointment we will show her how to use it. Explained  then we will have her take her blood pressure weekly and enter into the app. I explained she will have some visits in office and some virtually. She already has Sports coach. I reviewed her new ob  appointment date/ time with her , our location and to wear mask, no visitors.  I explained she will have a pelvic exam, ob bloodwork, hemoglobin a1C, cbg , and  genetic testing if desired,- she does want a panorama. I scheduled an Korea at 19 weeks and gave her the appointment. She voices understanding.   [place] Silver[place],RN 07/31/2019  9:29 AM

## 2019-07-31 NOTE — Addendum Note (Signed)
Addended by: Raynald Blend L on: 07/31/2019 11:30 AM   Modules accepted: Orders

## 2019-08-07 ENCOUNTER — Encounter: Payer: Self-pay | Admitting: Obstetrics and Gynecology

## 2019-08-07 ENCOUNTER — Other Ambulatory Visit (HOSPITAL_COMMUNITY)
Admission: RE | Admit: 2019-08-07 | Discharge: 2019-08-07 | Disposition: A | Discharge status: Home / Self Care | Payer: Medicaid Other | Source: Ambulatory Visit | Attending: Obstetrics and Gynecology | Admitting: Obstetrics and Gynecology

## 2019-08-07 ENCOUNTER — Ambulatory Visit (INDEPENDENT_AMBULATORY_CARE_PROVIDER_SITE_OTHER): Payer: Medicaid Other | Admitting: Obstetrics and Gynecology

## 2019-08-07 ENCOUNTER — Other Ambulatory Visit: Payer: Self-pay

## 2019-08-07 VITALS — BP 112/75 | HR 108 | Wt 314.0 lb

## 2019-08-07 DIAGNOSIS — O0990 Supervision of high risk pregnancy, unspecified, unspecified trimester: Secondary | ICD-10-CM

## 2019-08-07 DIAGNOSIS — Z8759 Personal history of other complications of pregnancy, childbirth and the puerperium: Secondary | ICD-10-CM

## 2019-08-07 DIAGNOSIS — O99210 Obesity complicating pregnancy, unspecified trimester: Secondary | ICD-10-CM | POA: Insufficient documentation

## 2019-08-07 DIAGNOSIS — O0992 Supervision of high risk pregnancy, unspecified, second trimester: Secondary | ICD-10-CM | POA: Diagnosis not present

## 2019-08-07 DIAGNOSIS — M5430 Sciatica, unspecified side: Secondary | ICD-10-CM

## 2019-08-07 DIAGNOSIS — O09299 Supervision of pregnancy with other poor reproductive or obstetric history, unspecified trimester: Secondary | ICD-10-CM

## 2019-08-07 DIAGNOSIS — Z6841 Body Mass Index (BMI) 40.0 and over, adult: Secondary | ICD-10-CM | POA: Insufficient documentation

## 2019-08-07 DIAGNOSIS — O209 Hemorrhage in early pregnancy, unspecified: Secondary | ICD-10-CM

## 2019-08-07 DIAGNOSIS — O34219 Maternal care for unspecified type scar from previous cesarean delivery: Secondary | ICD-10-CM

## 2019-08-07 DIAGNOSIS — Z3A16 16 weeks gestation of pregnancy: Secondary | ICD-10-CM

## 2019-08-07 HISTORY — DX: Sciatica, unspecified side: M54.30

## 2019-08-07 HISTORY — DX: Body Mass Index (BMI) 40.0 and over, adult: Z684

## 2019-08-07 LAB — POCT URINALYSIS DIP (DEVICE)
Glucose, UA: NEGATIVE mg/dL
Hgb urine dipstick: NEGATIVE
Ketones, ur: 40 mg/dL — AB
Nitrite: NEGATIVE
Protein, ur: NEGATIVE mg/dL
Specific Gravity, Urine: >=1.03 (ref 1.005–1.030)
Urobilinogen, UA: 0.2 mg/dL (ref 0.0–1.0)
pH: 5.5 (ref 5.0–8.0)

## 2019-08-07 LAB — GLUCOSE, CAPILLARY: Glucose-Capillary: 85 mg/dL (ref 70–99)

## 2019-08-07 MED ORDER — ASPIRIN EC 81 MG PO TBEC: 81.0000 mg | DELAYED_RELEASE_TABLET | Freq: Every day | ORAL | 2 refills | Status: DC

## 2019-08-07 NOTE — Progress Notes (Signed)
New OB Note  08/07/2019   Clinic: [facility]  Chief Complaint: NOB  History of Present Illness: Ms. Dogan is a 29 y.o. G3P2002 @ 16/4 weeks ([place] 10/7, based on 11wk u/s)  No LMP recorded (lmp unknown). Patient is pregnant. Preg complicated by has History of fetal abnormality in previous pregnancy, currently pregnant; Previous cesarean delivery affecting pregnancy, antepartum; Paroxysmal supraventricular tachycardia ([place]); Palpitations; Atypical chest pain; Anxiety; Psychogenic syncope; Supervision of high risk pregnancy, antepartum; History of gestational hypertension; BMI 40.0-44.9, adult ([place]); Obesity in pregnancy; and Sciatic leg pain on their problem list.    ROS: A 12-point review of systems was performed and negative, except as stated in the above HPI.  OBGYN History: As per HPI. OB History  Gravida Para Term Preterm AB Living  3 2 2  0 0 2  SAB TAB Ectopic Multiple Live Births  0 0 0 0 2    # Outcome Date GA Lbr Len/2nd Weight Sex Delivery Anes PTL Lv  3 Current           2 Term 01/25/14 [redacted]w[redacted]d  7 lb 1 oz (3.204 kg) F CS-LTranv EPI  LIV     Birth Comments: repeat C/S ; signs of preeclampsia- admitted from ob visit for IOL but c/s for FTP  1 Term 10/11/11 [redacted]w[redacted]d  6 lb 6 oz (2.892 kg) F CS-LTranv Spinal N LIV     Birth Comments: wnl     Past Medical History: Past Medical History:  Diagnosis Date  . Arrhythmia   . Gestational hypertension   . Prediabetes   . SVT (supraventricular tachycardia) (HCC)     Past Surgical History: Past Surgical History:  Procedure Laterality Date  . CESAREAN SECTION    . CESAREAN SECTION N/A 01/25/2014   Procedure: CESAREAN SECTION;  Surgeon: Jonnie Kind, MD;  Location: [facility];  Service: Obstetrics;  Laterality: N/A;  . TONSILLECTOMY    . WISDOM TOOTH EXTRACTION  2012    Family History:  Family History  Problem Relation Age of Onset  . Hyperlipidemia Mother   . Hypertension Mother   . Diabetes Mother   .  Asthma Father   . Asthma Sister   . Asthma Brother   . Birth defects Daughter        right renal duplicated collecting system    Social History:  Social History   Socioeconomic History  . Marital status: Single    Spouse name: Not on file  . Number of children: Not on file  . Years of education: Not on file  . Highest education level: Not on file  Occupational History  . Not on file  Tobacco Use  . Smoking status: Never Smoker  . Smokeless tobacco: Never Used  Substance and Sexual Activity  . Alcohol use: No  . Drug use: No  . Sexual activity: Yes    Birth control/protection: None  Other Topics Concern  . Not on file  Social History Narrative  . Not on file   Social Determinants of Health   Financial Resource Strain:   . Difficulty of Paying Living Expenses:   Food Insecurity: No Food Insecurity  . Worried About Charity fundraiser in the Last Year: Never true  . Ran Out of Food in the Last Year: Never true  Transportation Needs: No Transportation Needs  . Lack of Transportation (Medical): No  . Lack of Transportation (Non-Medical): No  Physical Activity:   . Days of Exercise per Week:   .  Minutes of Exercise per Session:   Stress:   . Feeling of Stress :   Social Connections:   . Frequency of Communication with Friends and Family:   . Frequency of Social Gatherings with Friends and Family:   . Attends Religious Services:   . Active Member of Clubs or Organizations:   . Attends Banker Meetings:   Marland Kitchen Marital Status:   Intimate Partner Violence:   . Fear of Current or Ex-Partner:   . Emotionally Abused:   Marland Kitchen Physically Abused:   . Sexually Abused:     Allergy: Allergies  Allergen Reactions  . Vancomycin Other (See Comments)    Reaction:  Red man syndrome    Current Outpatient Medications: Prenatal vitamin  Physical Exam:   BP 112/75   Pulse (!) 108   Wt (!) 314 lb (142.4 kg)   LMP  (LMP Unknown)   BMI 50.68 kg/m  Body mass index  is 50.68 kg/m. Contractions: Not present Vag. Bleeding: None. Fundal height: not applicable FHTs: 150s  General appearance: Well nourished, well developed female in no acute distress.  Cardiovascular: S1, S2 normal, no murmur, rub or gallop, regular rate and rhythm Respiratory:  Clear to auscultation bilateral. Normal respiratory effort Abdomen: positive bowel sounds and no masses, hernias; diffusely non tender to palpation, non distended Neuro/Psych:  Normal mood and affect.  Skin:  Warm and dry.  Lymphatic:  No inguinal lymphadenopathy.   Pelvic exam: is not limited by body habitus EGBUS: within normal limits, Vagina: within normal limits and with no blood in the vault, Cervix: normal appearing cervix without discharge, small nabothian cyst seen (friable), closed/long/high, Uterus:  16wk sized, and Adnexa:  normal adnexa and no mass, fullness, tenderness  Laboratory: none  Imaging:  reviewed  Assessment: pt doing well  Plan: 1. Supervision of high risk pregnancy, antepartum Routine care. VB with sex likely due to nabothian cyst. Pt was on metformin to help with getting pregnant. Follow up a1c to see if needs to early GTT. Recommend pt come off metformin.  - Culture, OB Urine - Obstetric Panel, Including HIV - Protein / creatinine ratio, urine - Hemoglobin A1c - Comprehensive metabolic panel - Genetic Screening - Hepatitis C Antibody - Cervicovaginal ancillary only( [place]) - AFP, Serum, Open Spina Bifida - Vitamin D 1,25 dihydroxy  2. Previous cesarean delivery affecting pregnancy, antepartum X2. Schedule repeat later in pregnancy  3. History of gestational hypertension Pt amenable to low dose ASA - Protein / creatinine ratio, urine - Comprehensive metabolic panel  4. History of fetal abnormality in previous pregnancy, currently pregnant See problem list. Normal 2nd child and doing well  5. Vaginal bleeding affecting early pregnancy  Problem list reviewed  and updated.  Follow up in 3 weeks.   >50% of 30 min visit spent on counseling and coordination of care.    Cornelia Copa MD Attending Center for [facility])

## 2019-08-08 ENCOUNTER — Telehealth: Payer: Self-pay

## 2019-08-08 ENCOUNTER — Encounter: Payer: Self-pay | Admitting: *Deleted

## 2019-08-08 LAB — CERVICOVAGINAL ANCILLARY ONLY
Bacterial Vaginitis (gardnerella): NEGATIVE
Candida Glabrata: NEGATIVE
Candida Vaginitis: NEGATIVE
Chlamydia: NEGATIVE
Comment: NEGATIVE
Comment: NEGATIVE
Comment: NEGATIVE
Comment: NEGATIVE
Comment: NEGATIVE
Comment: NORMAL
Neisseria Gonorrhea: NEGATIVE
Trichomonas: NEGATIVE

## 2019-08-08 LAB — PROTEIN / CREATININE RATIO, URINE
Creatinine, Urine: 178.6 mg/dL
Protein, Ur: 12.6 mg/dL
Protein/Creat Ratio: 71 mg/g creat (ref 0–200)

## 2019-08-08 NOTE — Telephone Encounter (Addendum)
Pt called and stated that she needs something for a yeast infection.    Pt called and stated that the provider was suppose to have prescribed something for a yeast infection.  I advised the pt that her wet prep came back with no yeast so there is no need for treatment.  Pt verbalized understanding.    Addison Naegeli, RN

## 2019-08-09 LAB — OBSTETRIC PANEL, INCLUDING HIV
Antibody Screen: NEGATIVE
Basophils Absolute: 0 10*3/uL (ref 0.0–0.2)
Basos: 0 %
EOS (ABSOLUTE): 0.3 10*3/uL (ref 0.0–0.4)
Eos: 3 %
HIV Screen 4th Generation wRfx: NONREACTIVE
Hematocrit: 34.9 % (ref 34.0–46.6)
Hemoglobin: 11.8 g/dL (ref 11.1–15.9)
Hepatitis B Surface Ag: NEGATIVE
Immature Grans (Abs): 0.1 10*3/uL (ref 0.0–0.1)
Immature Granulocytes: 1 %
Lymphocytes Absolute: 1.9 10*3/uL (ref 0.7–3.1)
Lymphs: 22 %
MCH: 30.1 pg (ref 26.6–33.0)
MCHC: 33.8 g/dL (ref 31.5–35.7)
MCV: 89 fL (ref 79–97)
Monocytes Absolute: 0.4 10*3/uL (ref 0.1–0.9)
Monocytes: 4 %
Neutrophils Absolute: 6.1 10*3/uL (ref 1.4–7.0)
Neutrophils: 70 %
Platelets: 241 10*3/uL (ref 150–450)
RBC: 3.92 x10E6/uL (ref 3.77–5.28)
RDW: 12.3 % (ref 11.7–15.4)
RPR Ser Ql: NONREACTIVE
Rh Factor: POSITIVE
Rubella Antibodies, IGG: 2.11 index (ref >0.99)
WBC: 8.8 10*3/uL (ref 3.4–10.8)

## 2019-08-09 LAB — COMPREHENSIVE METABOLIC PANEL
ALT: 17 IU/L (ref 0–32)
AST: 17 IU/L (ref 0–40)
Albumin/Globulin Ratio: 1.4 (ref 1.2–2.2)
Albumin: 3.7 g/dL — ABNORMAL LOW (ref 3.9–5.0)
Alkaline Phosphatase: 52 IU/L (ref 39–117)
BUN/Creatinine Ratio: 11 (ref 9–23)
BUN: 5 mg/dL — ABNORMAL LOW (ref 6–20)
Bilirubin Total: 0.2 mg/dL (ref 0.0–1.2)
CO2: 18 mmol/L — ABNORMAL LOW (ref 20–29)
Calcium: 8.9 mg/dL (ref 8.7–10.2)
Chloride: 104 mmol/L (ref 96–106)
Creatinine, Ser: 0.47 mg/dL — ABNORMAL LOW (ref 0.57–1.00)
GFR calc Af Amer: 157 mL/min/{1.73_m2} (ref >59)
GFR calc non Af Amer: 136 mL/min/{1.73_m2} (ref >59)
Globulin, Total: 2.7 g/dL (ref 1.5–4.5)
Glucose: 82 mg/dL (ref 65–99)
Potassium: 3.9 mmol/L (ref 3.5–5.2)
Sodium: 137 mmol/L (ref 134–144)
Total Protein: 6.4 g/dL (ref 6.0–8.5)

## 2019-08-09 LAB — AFP, SERUM, OPEN SPINA BIFIDA
AFP MoM: 0.72
AFP Value: 17.1 ng/mL
Gest. Age on Collection Date: 16.4 weeks
Maternal Age At EDD: 27.7 yr
OSBR Risk 1 IN: 10000
Test Results:: NEGATIVE
Weight: 314 [lb_av]

## 2019-08-09 LAB — HEPATITIS C ANTIBODY: Hep C Virus Ab: <0.1 s/co ratio (ref 0.0–0.9)

## 2019-08-09 LAB — HEMOGLOBIN A1C
Est. average glucose Bld gHb Est-mCnc: 114 mg/dL
Hgb A1c MFr Bld: 5.6 % (ref 4.8–5.6)

## 2019-08-09 LAB — CULTURE, OB URINE

## 2019-08-09 LAB — URINE CULTURE, OB REFLEX

## 2019-08-16 ENCOUNTER — Encounter: Payer: Self-pay | Admitting: General Practice

## 2019-08-16 LAB — VITAMIN D 1,25 DIHYDROXY
Vitamin D 1, 25 (OH)2 Total: 165 pg/mL — ABNORMAL HIGH
Vitamin D2 1, 25 (OH)2: <10 pg/mL
Vitamin D3 1, 25 (OH)2: 163 pg/mL

## 2019-08-16 NOTE — Progress Notes (Signed)
Received alert from Babyscripts due to elevated BP of 151/90. Patient rechecked her BP and it was 135/79. No intervention needed at this time. Patient will follow up on 5/17 at [facility] follow up.

## 2019-08-17 ENCOUNTER — Telehealth: Payer: Self-pay

## 2019-08-17 NOTE — Telephone Encounter (Signed)
Pt reports that she has had some bleeding and some lower abdominal tummy aches.

## 2019-08-21 ENCOUNTER — Encounter: Payer: Self-pay | Admitting: *Deleted

## 2019-08-21 NOTE — Telephone Encounter (Addendum)
Called pt to discuss her concerns and heard message stating that "the person you are trying to reach has a voicemail which has not been set up yet."  MyChart message sent to pt.  [phone]  Pt returned my call and we discussed her previous concern. She stated that last week she was having some bright red vaginal bleeding requiring a panti liner only. It turned to brown discharge after a Dawaun Brancato or two. She was also having some abdominal cramping. Pt reports that now she is not having any bleeding and the cramping is less. She also states that she had elevated BP on 5/8 - 144/69. She was not having H/A or visual disturbances @ the time of the elevated BP. Pt states she was not told how often to check her BP. I advised pt that she may take Tylenol for the cramping however, we are not worried about mild cramping. If she develops more significant pain she should contact our office or go to MAU. Since pt had BP elevation 2 days ago I asked her to check it later today once she is home from work. After that she will only need to check her BP once per week.  If she has an elevated BP and also has H/A or visual disturbances, she should go to MAU for evaluation. Pt voiced understanding of all information and instructions given.

## 2019-08-28 ENCOUNTER — Ambulatory Visit (INDEPENDENT_AMBULATORY_CARE_PROVIDER_SITE_OTHER): Payer: Medicaid Other | Admitting: Family Medicine

## 2019-08-28 ENCOUNTER — Other Ambulatory Visit: Payer: Self-pay

## 2019-08-28 ENCOUNTER — Ambulatory Visit: Payer: Medicaid Other | Admitting: *Deleted

## 2019-08-28 ENCOUNTER — Ambulatory Visit: Payer: Medicaid Other | Attending: Obstetrics and Gynecology

## 2019-08-28 ENCOUNTER — Other Ambulatory Visit: Payer: Self-pay | Admitting: *Deleted

## 2019-08-28 VITALS — BP 119/71 | HR 78 | Wt 315.0 lb

## 2019-08-28 DIAGNOSIS — O34219 Maternal care for unspecified type scar from previous cesarean delivery: Secondary | ICD-10-CM

## 2019-08-28 DIAGNOSIS — O09299 Supervision of pregnancy with other poor reproductive or obstetric history, unspecified trimester: Secondary | ICD-10-CM

## 2019-08-28 DIAGNOSIS — I471 Supraventricular tachycardia, unspecified: Secondary | ICD-10-CM

## 2019-08-28 DIAGNOSIS — Z8759 Personal history of other complications of pregnancy, childbirth and the puerperium: Secondary | ICD-10-CM | POA: Insufficient documentation

## 2019-08-28 DIAGNOSIS — O0992 Supervision of high risk pregnancy, unspecified, second trimester: Secondary | ICD-10-CM

## 2019-08-28 DIAGNOSIS — O0990 Supervision of high risk pregnancy, unspecified, unspecified trimester: Secondary | ICD-10-CM

## 2019-08-28 DIAGNOSIS — Z363 Encounter for antenatal screening for malformations: Secondary | ICD-10-CM | POA: Diagnosis not present

## 2019-08-28 DIAGNOSIS — Z98891 History of uterine scar from previous surgery: Secondary | ICD-10-CM

## 2019-08-28 DIAGNOSIS — E669 Obesity, unspecified: Secondary | ICD-10-CM

## 2019-08-28 DIAGNOSIS — O99212 Obesity complicating pregnancy, second trimester: Secondary | ICD-10-CM

## 2019-08-28 DIAGNOSIS — Z3A19 19 weeks gestation of pregnancy: Secondary | ICD-10-CM

## 2019-08-28 DIAGNOSIS — O99412 Diseases of the circulatory system complicating pregnancy, second trimester: Secondary | ICD-10-CM

## 2019-08-28 DIAGNOSIS — O132 Gestational [pregnancy-induced] hypertension without significant proteinuria, second trimester: Secondary | ICD-10-CM | POA: Diagnosis not present

## 2019-08-28 DIAGNOSIS — O99210 Obesity complicating pregnancy, unspecified trimester: Secondary | ICD-10-CM | POA: Diagnosis present

## 2019-08-28 DIAGNOSIS — O24312 Unspecified pre-existing diabetes mellitus in pregnancy, second trimester: Secondary | ICD-10-CM | POA: Diagnosis not present

## 2019-08-28 DIAGNOSIS — O09292 Supervision of pregnancy with other poor reproductive or obstetric history, second trimester: Secondary | ICD-10-CM

## 2019-08-28 DIAGNOSIS — Z6841 Body Mass Index (BMI) 40.0 and over, adult: Secondary | ICD-10-CM

## 2019-08-28 DIAGNOSIS — O24319 Unspecified pre-existing diabetes mellitus in pregnancy, unspecified trimester: Secondary | ICD-10-CM

## 2019-08-28 MED ORDER — PREPLUS 27-1 MG PO TABS: 1.0000 | ORAL_TABLET | Freq: Every day | ORAL | 3 refills | Status: DC

## 2019-08-28 NOTE — Progress Notes (Signed)
   PRENATAL VISIT NOTE  Subjective:  Veronica Boyle is a 29 y.o. G3P2002 at [redacted]w[redacted]d being seen today for ongoing prenatal care.  She is currently monitored for the following issues for this high-risk pregnancy and has History of fetal abnormality in previous pregnancy, currently pregnant; Previous cesarean delivery affecting pregnancy, antepartum; Paroxysmal supraventricular tachycardia (HCC); Palpitations; Atypical chest pain; Anxiety; Psychogenic syncope; Supervision of high risk pregnancy, antepartum; History of gestational hypertension; BMI 40.0-44.9, adult (HCC); Obesity in pregnancy; and Sciatic leg pain on their problem list.  Patient reports no complaints.  Contractions: Not present. Vag. Bleeding: None.  Movement: Present. Denies leaking of fluid.   The following portions of the patient's history were reviewed and updated as appropriate: allergies, current medications, past family history, past medical history, past social history, past surgical history and problem list.   Objective:   Vitals:   08/28/19 1015  BP: 119/71  Pulse: 78  Weight: (!) 315 lb (142.9 kg)    Fetal Status: Fetal Heart Rate (bpm): 153   Movement: Present     General:  Alert, oriented and cooperative. Patient is in no acute distress.  Skin: Skin is warm and dry. No rash noted.   Cardiovascular: Normal heart rate noted  Respiratory: Normal respiratory effort, no problems with respiration noted  Abdomen: Soft, gravid, appropriate for gestational age.  Pain/Pressure: Absent     Pelvic: Cervical exam deferred        Extremities: Normal range of motion.  Edema: None  Mental Status: Normal mood and affect. Normal behavior. Normal judgment and thought content.   Assessment and Plan:  Pregnancy: G3P2002 at [redacted]w[redacted]d 1. Supervision of high risk pregnancy, antepartum Had some early vaginal bleeding with sex. Previous provider thought this was due to nabothian cyst. Has had no further bleeding - okay to resume sex.  2.  Previous cesarean delivery affecting pregnancy, antepartum Rpt c/s  3. History of gestational hypertension On ASA 81mg . BP normal  4. 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   5. BMI 40.0-44.9, adult (HCC) 10-15# weight gain recommended  6. Paroxysmal supraventricular tachycardia (HCC) No episodes.  7. Obesity in pregnancy  Preterm labor symptoms and general obstetric precautions including but not limited to vaginal bleeding, contractions, leaking of fluid and fetal movement were reviewed in detail with the patient. Please refer to After Visit Summary for other counseling recommendations.   Return in about 5 weeks (around 10/02/2019) for HR OB f/u, 2 hr GTT, In Office.  Future Appointments  Date Time Provider Department Center  09/26/2019  3:45 PM [facility]    [facility] DO

## 2019-08-30 ENCOUNTER — Other Ambulatory Visit: Payer: Self-pay | Admitting: Obstetrics and Gynecology

## 2019-08-30 DIAGNOSIS — R7989 Other specified abnormal findings of blood chemistry: Secondary | ICD-10-CM

## 2019-08-31 ENCOUNTER — Telehealth: Payer: Self-pay | Admitting: *Deleted

## 2019-08-31 NOTE — Telephone Encounter (Signed)
Called pt and discussed her concerns regarding elevated Vitamin D level and message sent from Dr. Vergie Living. Pt stated that she has never taken Vitamin D supplements and currently only takes prenatal vitamins. Pt stated that early in her pregnancy she was drinking orange juice fortified with Vit D - approximately 16 oz per Ahlia Lemanski. She does not drink this anymore. Pt was advised not to worry and that the result may be lab error. She was also advised not to eat foods which have been fortified with Vit D or take any supplements. Dr. Vergie Living recommends a repeat of the Vit D level. Pt agreed to come in next week. She will be scheduled for lab only appt and will be notified via MyChart.  Pt voiced understanding of all information and instructions given.

## 2019-09-07 ENCOUNTER — Ambulatory Visit (INDEPENDENT_AMBULATORY_CARE_PROVIDER_SITE_OTHER): Payer: Medicaid Other

## 2019-09-07 ENCOUNTER — Other Ambulatory Visit (HOSPITAL_COMMUNITY)
Admission: RE | Admit: 2019-09-07 | Discharge: 2019-09-07 | Disposition: A | Discharge status: Home / Self Care | Payer: Medicaid Other | Source: Ambulatory Visit | Attending: Family Medicine | Admitting: Family Medicine

## 2019-09-07 ENCOUNTER — Other Ambulatory Visit (INDEPENDENT_AMBULATORY_CARE_PROVIDER_SITE_OTHER): Payer: Medicaid Other

## 2019-09-07 ENCOUNTER — Other Ambulatory Visit: Payer: Self-pay

## 2019-09-07 VITALS — BP 142/84 | HR 95 | Wt 318.1 lb

## 2019-09-07 DIAGNOSIS — N898 Other specified noninflammatory disorders of vagina: Secondary | ICD-10-CM | POA: Insufficient documentation

## 2019-09-07 DIAGNOSIS — R7989 Other specified abnormal findings of blood chemistry: Secondary | ICD-10-CM

## 2019-09-07 LAB — POCT URINALYSIS DIP (DEVICE)
Bilirubin Urine: NEGATIVE
Glucose, UA: NEGATIVE mg/dL
Hgb urine dipstick: NEGATIVE
Ketones, ur: NEGATIVE mg/dL
Nitrite: NEGATIVE
Protein, ur: NEGATIVE mg/dL
Specific Gravity, Urine: 1.025 (ref 1.005–1.030)
Urobilinogen, UA: 0.2 mg/dL (ref 0.0–1.0)
pH: 6 (ref 5.0–8.0)

## 2019-09-07 MED ORDER — TERCONAZOLE 0.4 % VA CREA: 1.0000 | TOPICAL_CREAM | Freq: Every day | VAGINAL | 0 refills | Status: DC

## 2019-09-07 NOTE — Progress Notes (Signed)
Pt here for self-swab. Pt complaining of vaginal itching. Pt denies any discharge or foul odor. Reports recent burning with urination that has now resolved. Denies frequency or pain. Self-swab and urine specimen collected.   UA is not suspicious for UTI. Pt states this feels like a yeast infection she has had before. Terazol 7 ordered per protocol to patient's preferred pharmacy. Explained to pt we will call with any results that are abnormal. Encouraged pt to follow up with any change in symptoms.   BP checked today at visit: 138/103. Recheck was: 142/84. Pt denies any s/s of hypertension at this time. Pt reports hx of gestational hypertension but denies ever taking any medication for BP. Reviewed with Alvester Morin, MD who states pt may monitor at home; pt educated to take BP whenever any headache, blurry vision, or dizziness. If BP is 140/90 or greater, pt to go to [facility]U for evaluation. Pt verbalizes understanding. Pt to follow up at virtual BP check on 09/12/19.  Fleet Contras RN 09/07/19

## 2019-09-08 ENCOUNTER — Other Ambulatory Visit: Payer: Self-pay

## 2019-09-08 ENCOUNTER — Encounter (HOSPITAL_COMMUNITY): Payer: Self-pay | Admitting: Obstetrics and Gynecology

## 2019-09-08 ENCOUNTER — Inpatient Hospital Stay (HOSPITAL_COMMUNITY)
Admission: AD | Admit: 2019-09-08 | Discharge: 2019-09-08 | Disposition: A | Discharge status: Home / Self Care | Payer: Medicaid Other | Attending: Obstetrics and Gynecology | Admitting: Obstetrics and Gynecology

## 2019-09-08 ENCOUNTER — Telehealth: Payer: Self-pay | Admitting: *Deleted

## 2019-09-08 DIAGNOSIS — R109 Unspecified abdominal pain: Secondary | ICD-10-CM | POA: Insufficient documentation

## 2019-09-08 DIAGNOSIS — O162 Unspecified maternal hypertension, second trimester: Secondary | ICD-10-CM | POA: Diagnosis not present

## 2019-09-08 DIAGNOSIS — O0990 Supervision of high risk pregnancy, unspecified, unspecified trimester: Secondary | ICD-10-CM

## 2019-09-08 DIAGNOSIS — Z833 Family history of diabetes mellitus: Secondary | ICD-10-CM | POA: Insufficient documentation

## 2019-09-08 DIAGNOSIS — O99891 Other specified diseases and conditions complicating pregnancy: Secondary | ICD-10-CM | POA: Insufficient documentation

## 2019-09-08 DIAGNOSIS — O34219 Maternal care for unspecified type scar from previous cesarean delivery: Secondary | ICD-10-CM | POA: Insufficient documentation

## 2019-09-08 DIAGNOSIS — Z8249 Family history of ischemic heart disease and other diseases of the circulatory system: Secondary | ICD-10-CM | POA: Insufficient documentation

## 2019-09-08 DIAGNOSIS — Z8759 Personal history of other complications of pregnancy, childbirth and the puerperium: Secondary | ICD-10-CM

## 2019-09-08 DIAGNOSIS — Z8349 Family history of other endocrine, nutritional and metabolic diseases: Secondary | ICD-10-CM | POA: Diagnosis not present

## 2019-09-08 DIAGNOSIS — Z825 Family history of asthma and other chronic lower respiratory diseases: Secondary | ICD-10-CM | POA: Diagnosis not present

## 2019-09-08 DIAGNOSIS — Z3A21 21 weeks gestation of pregnancy: Secondary | ICD-10-CM | POA: Insufficient documentation

## 2019-09-08 DIAGNOSIS — M7989 Other specified soft tissue disorders: Secondary | ICD-10-CM | POA: Insufficient documentation

## 2019-09-08 DIAGNOSIS — O26892 Other specified pregnancy related conditions, second trimester: Secondary | ICD-10-CM | POA: Diagnosis not present

## 2019-09-08 DIAGNOSIS — R519 Headache, unspecified: Secondary | ICD-10-CM | POA: Diagnosis not present

## 2019-09-08 DIAGNOSIS — O26899 Other specified pregnancy related conditions, unspecified trimester: Secondary | ICD-10-CM

## 2019-09-08 DIAGNOSIS — Z881 Allergy status to other antibiotic agents status: Secondary | ICD-10-CM | POA: Insufficient documentation

## 2019-09-08 DIAGNOSIS — I10 Essential (primary) hypertension: Secondary | ICD-10-CM | POA: Diagnosis present

## 2019-09-08 LAB — CBC
HCT: 34.4 % — ABNORMAL LOW (ref 36.0–46.0)
Hemoglobin: 11.4 g/dL — ABNORMAL LOW (ref 12.0–15.0)
MCH: 30 pg (ref 26.0–34.0)
MCHC: 33.1 g/dL (ref 30.0–36.0)
MCV: 90.5 fL (ref 80.0–100.0)
Platelets: 250 10*3/uL (ref 150–400)
RBC: 3.8 MIL/uL — ABNORMAL LOW (ref 3.87–5.11)
RDW: 12.8 % (ref 11.5–15.5)
WBC: 9.7 10*3/uL (ref 4.0–10.5)
nRBC: 0 % (ref 0.0–0.2)

## 2019-09-08 LAB — URINALYSIS, ROUTINE W REFLEX MICROSCOPIC
Bilirubin Urine: NEGATIVE
Glucose, UA: NEGATIVE mg/dL
Ketones, ur: 20 mg/dL — AB
Nitrite: NEGATIVE
Protein, ur: NEGATIVE mg/dL
Specific Gravity, Urine: 1.017 (ref 1.005–1.030)
pH: 6 (ref 5.0–8.0)

## 2019-09-08 LAB — COMPREHENSIVE METABOLIC PANEL
ALT: 18 U/L (ref 0–44)
AST: 22 U/L (ref 15–41)
Albumin: 2.7 g/dL — ABNORMAL LOW (ref 3.5–5.0)
Alkaline Phosphatase: 42 U/L (ref 38–126)
Anion gap: 11 (ref 5–15)
BUN: 5 mg/dL — ABNORMAL LOW (ref 6–20)
CO2: 21 mmol/L — ABNORMAL LOW (ref 22–32)
Calcium: 8.5 mg/dL — ABNORMAL LOW (ref 8.9–10.3)
Chloride: 105 mmol/L (ref 98–111)
Creatinine, Ser: 0.48 mg/dL (ref 0.44–1.00)
GFR calc Af Amer: >60 mL/min (ref >60)
GFR calc non Af Amer: >60 mL/min (ref >60)
Glucose, Bld: 95 mg/dL (ref 70–99)
Potassium: 3.6 mmol/L (ref 3.5–5.1)
Sodium: 137 mmol/L (ref 135–145)
Total Bilirubin: 0.6 mg/dL (ref 0.3–1.2)
Total Protein: 6.4 g/dL — ABNORMAL LOW (ref 6.5–8.1)

## 2019-09-08 LAB — VITAMIN D 25 HYDROXY (VIT D DEFICIENCY, FRACTURES): Vit D, 25-Hydroxy: 15.3 ng/mL — ABNORMAL LOW (ref 30.0–100.0)

## 2019-09-08 LAB — PROTEIN / CREATININE RATIO, URINE
Creatinine, Urine: 138.67 mg/dL
Protein Creatinine Ratio: 0.06 mg/mg{Cre} (ref 0.00–0.15)
Total Protein, Urine: 9 mg/dL

## 2019-09-08 MED ORDER — ACETAMINOPHEN 500 MG PO TABS
1000.0000 mg | ORAL_TABLET | Freq: Once | ORAL | Status: AC
  Administered 2019-09-08: 1000 mg via ORAL
  Filled 2019-09-08: qty 2

## 2019-09-08 NOTE — Telephone Encounter (Signed)
Received a call with Babyscripts Alert for High blood pressure reading of 157/76, headache , edema. Per chart patient has already been advised to go to MAU for evaluation and per chart review is currently being evaluated in MAU.  Kieron Kantner,RN

## 2019-09-08 NOTE — Discharge Instructions (Signed)
PLEASE TAKE YOUR BLOOD PRESSURE CUFF WITH YOU TO YOUR APPOINTMENT!

## 2019-09-08 NOTE — MAU Note (Signed)
Patient presented to MAU at [redacted]w[redacted]d gestation with c/o elevated blood pressures at home and OB office yesterday. Patient reports edema in her feet and decreased fetal movement. Patient reports having a mild headache, denies visual changes. Denies LOF and vaginal bleeding. Patient feels discomfort in her abdomen, shifts from mid upper to mid lower quadrant per patient.

## 2019-09-08 NOTE — MAU Provider Note (Signed)
History     CSN: [id number]  Arrival date and time: 09/08/19 0901   First Provider Initiated Contact with Patient 09/08/19 1015     Chief Complaint  Patient presents with  . Hypertension  . Decreased Fetal Movement  . Abdominal Pain   Ms.  Veronica Boyle is a 29 y.o. year old G64P2002 female at [redacted]w[redacted]d weeks gestation who presents to MAU reporting elevated blood pressure at [facility] and at home yesterday.  She reports edema in her feet and DFM mild headache she took Tylenol for the pain no relief.  Also reports abdominal discomfort denies contractions.   OB History    Gravida  3   Para  2   Term  2   Preterm  0   AB  0   Living  2     SAB  0   TAB  0   Ectopic  0   Multiple  0   Live Births  2           Past Medical History:  Diagnosis Date  . Arrhythmia   . Gestational hypertension   . Prediabetes   . SVT (supraventricular tachycardia) (HCC)     Past Surgical History:  Procedure Laterality Date  . CESAREAN SECTION    . CESAREAN SECTION N/A 01/25/2014   Procedure: CESAREAN SECTION;  Surgeon: Tilda Burrow, MD;  Location: [facility];  Service: Obstetrics;  Laterality: N/A;  . TONSILLECTOMY    . WISDOM TOOTH EXTRACTION  2012    Family History  Problem Relation Age of Onset  . Hyperlipidemia Mother   . Hypertension Mother   . Diabetes Mother   . Asthma Father   . Asthma Sister   . Asthma Brother   . Birth defects Daughter        right renal duplicated collecting system    Social History   Tobacco Use  . Smoking status: Never Smoker  . Smokeless tobacco: Never Used  Substance Use Topics  . Alcohol use: No  . Drug use: No    Allergies:  Allergies  Allergen Reactions  . Vancomycin Other (See Comments)    Reaction:  Red man syndrome    No medications prior to admission.    Review of Systems  Constitutional: Negative.   HENT: Negative.   Eyes: Negative.   Respiratory: Negative.   Cardiovascular: Positive for leg swelling.   Gastrointestinal: Negative.   Endocrine: Negative.   Genitourinary:       DFM  Musculoskeletal: Negative.   Skin: Negative.   Allergic/Immunologic: Negative.   Neurological: Positive for headaches.  Hematological: Negative.   Psychiatric/Behavioral: Negative.    Physical Exam   Patient Vitals for the past 24 hrs:  BP Temp Temp src Pulse Resp SpO2 Height Weight  09/08/19 1325 125/81 98.3 F (36.8 C) Oral (!) 102 18 99 % -- --  09/08/19 1212 -- -- -- -- -- 99 % -- --  09/08/19 1200 (!) 96/41 -- -- (!) 112 -- 99 % -- --  09/08/19 1145 130/72 -- -- (!) 102 -- 99 % -- --  09/08/19 1130 122/64 -- -- 99 -- 99 % -- --  09/08/19 1115 (!) 110/56 -- -- (!) 101 -- 99 % -- --  09/08/19 1100 (!) 115/55 -- -- 98 -- 99 % -- --  09/08/19 1045 123/68 -- -- (!) 106 -- 99 % -- --  09/08/19 1030 123/62 -- -- (!) 109 -- 99 % -- --  09/08/19 1015  118/67 -- -- (!) 113 -- 98 % -- --  09/08/19 1001 116/68 -- -- (!) 110 -- -- -- --  09/08/19 1000 -- -- -- -- -- 99 % -- --  09/08/19 0946 133/64 -- -- (!) 119 -- -- -- --  09/08/19 0945 -- -- -- -- -- 99 % -- --  09/08/19 0935 -- -- -- -- -- 98 % -- --  09/08/19 0934 134/73 -- -- (!) 108 -- -- -- --  09/08/19 0931 -- 98.9 F (37.2 C) Oral -- 20 -- 5\' 6"  (1.676 m) --  09/08/19 0918 -- -- -- -- -- -- -- (!) 143.8 kg     Physical Exam  Nursing note and vitals reviewed. Constitutional: She is oriented to person, place, and time. She appears well-developed and well-nourished.  HENT:  Head: Normocephalic and atraumatic.  Eyes: Pupils are equal, round, and reactive to light.  Cardiovascular: Normal rate and regular rhythm.  Respiratory: Effort normal.  GI: Soft.  Musculoskeletal:        General: Normal range of motion.     Cervical back: Normal range of motion.  Neurological: She is alert and oriented to person, place, and time.  Skin: Skin is warm and dry.  Psychiatric: She has a normal mood and affect. Her behavior is normal. Judgment and thought  content normal.   FHTs by doppler: 160 bpm   MAU Course  Procedures  MDM CCUA CBC CMP P/C Ratio Serial BP's   Results for orders placed or performed during the hospital encounter of 09/08/19 (from the past 24 hour(s))  Urinalysis, Routine w reflex microscopic     Status: Abnormal   Collection Time: 09/08/19  9:29 AM  Result Value Ref Range   Color, Urine YELLOW YELLOW   APPearance CLOUDY (A) CLEAR   Specific Gravity, Urine 1.017 1.005 - 1.030   pH 6.0 5.0 - 8.0   Glucose, UA NEGATIVE NEGATIVE mg/dL   Hgb urine dipstick SMALL (A) NEGATIVE   Bilirubin Urine NEGATIVE NEGATIVE   Ketones, ur 20 (A) NEGATIVE mg/dL   Protein, ur NEGATIVE NEGATIVE mg/dL   Nitrite NEGATIVE NEGATIVE   Leukocytes,Ua LARGE (A) NEGATIVE   RBC / HPF 6-10 0 - 5 RBC/hpf   WBC, UA 11-20 0 - 5 WBC/hpf   Bacteria, UA MANY (A) NONE SEEN   Squamous Epithelial / LPF 11-20 0 - 5   Mucus PRESENT   Protein / creatinine ratio, urine     Status: None   Collection Time: 09/08/19  9:29 AM  Result Value Ref Range   Creatinine, Urine 138.67 mg/dL   Total Protein, Urine 9 mg/dL   Protein Creatinine Ratio 0.06 0.00 - 0.15 mg/mg[Cre]  CBC     Status: Abnormal   Collection Time: 09/08/19  9:52 AM  Result Value Ref Range   WBC 9.7 4.0 - 10.5 K/uL   RBC 3.80 (L) 3.87 - 5.11 MIL/uL   Hemoglobin 11.4 (L) 12.0 - 15.0 g/dL   HCT 34.4 (L) 36.0 - 46.0 %   MCV 90.5 80.0 - 100.0 fL   MCH 30.0 26.0 - 34.0 pg   MCHC 33.1 30.0 - 36.0 g/dL   RDW 12.8 11.5 - 15.5 %   Platelets 250 150 - 400 K/uL   nRBC 0.0 0.0 - 0.2 %  Comprehensive metabolic panel     Status: Abnormal   Collection Time: 09/08/19  9:52 AM  Result Value Ref Range   Sodium 137 135 - 145 mmol/L   Potassium  3.6 3.5 - 5.1 mmol/L   Chloride 105 98 - 111 mmol/L   CO2 21 (L) 22 - 32 mmol/L   Glucose, Bld 95 70 - 99 mg/dL   BUN 5 (L) 6 - 20 mg/dL   Creatinine, Ser 7.51 0.44 - 1.00 mg/dL   Calcium 8.5 (L) 8.9 - 10.3 mg/dL   Total Protein 6.4 (L) 6.5 - 8.1 g/dL    Albumin 2.7 (L) 3.5 - 5.0 g/dL   AST 22 15 - 41 U/L   ALT 18 0 - 44 U/L   Alkaline Phosphatase 42 38 - 126 U/L   Total Bilirubin 0.6 0.3 - 1.2 mg/dL   GFR calc non Af Amer >60 >60 mL/min   GFR calc Af Amer >60 >60 mL/min   Anion gap 11 5 - 15      Assessment and Plan  Abdominal pain affecting pregnancy  - Information provided on abd pain pregnancy   Gestational hypertension vs Chronic Hypertension - Information provided on HTN in pregnancy and PEC  - Discharge home - Keep scheduled appt with [facility] - Patient verbalized an understanding of the plan of care and agrees.     Raelyn Mora, MSN, CNM 09/08/2019, 10:24 AM

## 2019-09-12 ENCOUNTER — Other Ambulatory Visit: Payer: Self-pay

## 2019-09-12 ENCOUNTER — Telehealth (INDEPENDENT_AMBULATORY_CARE_PROVIDER_SITE_OTHER): Payer: Medicaid Other | Admitting: *Deleted

## 2019-09-12 VITALS — BP 148/75 | HR 86

## 2019-09-12 DIAGNOSIS — Z0130 Encounter for examination of blood pressure without abnormal findings: Secondary | ICD-10-CM

## 2019-09-12 DIAGNOSIS — Z8759 Personal history of other complications of pregnancy, childbirth and the puerperium: Secondary | ICD-10-CM

## 2019-09-13 ENCOUNTER — Encounter: Payer: Self-pay | Admitting: *Deleted

## 2019-09-13 LAB — CERVICOVAGINAL ANCILLARY ONLY
Bacterial Vaginitis (gardnerella): NEGATIVE
Candida Glabrata: NEGATIVE
Candida Vaginitis: POSITIVE — AB
Chlamydia: NEGATIVE
Comment: NEGATIVE
Comment: NEGATIVE
Comment: NEGATIVE
Comment: NEGATIVE
Comment: NEGATIVE
Comment: NORMAL
Neisseria Gonorrhea: NEGATIVE
Trichomonas: NEGATIVE

## 2019-09-13 NOTE — Progress Notes (Signed)
Patient was assessed and managed by nursing staff during this encounter. I have reviewed the chart and agree with the documentation and plan. I have also made any necessary editorial changes.  Joselyn Arrow, MD 09/13/2019 3:27 PM

## 2019-09-13 NOTE — Progress Notes (Signed)
I connected with  Veronica Boyle on 09/12/2019 at  2:00 PM EDT by telephone and verified that I am speaking with the correct person using two identifiers.   I discussed the limitations, risks, security and privacy concerns of performing an evaluation and management service by telephone and the availability of in person appointments. I also discussed with the patient that there may be a patient responsible charge related to this service. The patient expressed understanding and agreed to proceed.  Pt stated that she has been checking her BP frequently because she thought she was supposed to. She has had some values which were elevated. These have been entered to [employer]. Even though she gets called by [employer] and our staff, most of these times she is not feeling bad. She does attest to having occasional headaches which are usually relieved by tylenol. She denied sx of blurry vision, dizziness or seeing spots in front of her eyes. Pt expressed concern that possibly her BP cuff is not functioning properly. During the video visit, I observed pt taking her BP and her technique is correct. Pt was asked to bring her BP cuff to next office appt on 6/22 and we will compare values obtained with her machine as well as that from the office. I advised pt that she only needs to check her BP once per week as long as she is not having symptoms of pre-e. If she does have any of the sx, then she should check the BP. If the BP is >160/100 along with having any sx, she should go to MAU for evaluation. Pt was advised that she may contact our office via phone or MyChart to discuss any concerns or questions.   Arnell Slivinski, Drucilla Schmidt, RN 09/13/2019  2:24 PM

## 2019-09-14 ENCOUNTER — Telehealth (INDEPENDENT_AMBULATORY_CARE_PROVIDER_SITE_OTHER): Payer: Medicaid Other | Admitting: Lactation Services

## 2019-09-14 ENCOUNTER — Other Ambulatory Visit: Payer: Self-pay | Admitting: Family Medicine

## 2019-09-14 ENCOUNTER — Other Ambulatory Visit: Payer: Self-pay | Admitting: Obstetrics and Gynecology

## 2019-09-14 DIAGNOSIS — R7989 Other specified abnormal findings of blood chemistry: Secondary | ICD-10-CM | POA: Insufficient documentation

## 2019-09-14 DIAGNOSIS — B373 Candidiasis of vulva and vagina: Secondary | ICD-10-CM

## 2019-09-14 DIAGNOSIS — B3731 Acute candidiasis of vulva and vagina: Secondary | ICD-10-CM

## 2019-09-14 HISTORY — DX: Other specified abnormal findings of blood chemistry: R79.89

## 2019-09-14 MED ORDER — VITAMIN D (ERGOCALCIFEROL) 1.25 MG (50000 UNIT) PO CAPS: 50000.0000 [IU] | ORAL_CAPSULE | ORAL | 0 refills | Status: AC

## 2019-09-14 NOTE — Telephone Encounter (Signed)
-----   Message from Federico Flake, MD sent at 09/14/2019 11:56 AM EDT ----- Evidence of yeast infection-- patient seen on 5/27 for vaginal itching. Treated with terazol on 5/27

## 2019-09-14 NOTE — Progress Notes (Signed)
Attestation of Attending Supervision of clinical support staff: I agree with the care provided to this patient and was available for any consultation.  I have reviewed the RN's note and chart. I was available for consult and to see the patient if needed.   Kimberly Niles Newton, MD, MPH, ABFM Attending Physician Faculty Practice- [facility]  

## 2019-09-14 NOTE — Telephone Encounter (Signed)
Called patient and informed her she does have a yeast infection. She is taking her Terazol. Patient clarified how to take her Vitamin D. Patient voiced understanding.

## 2019-09-19 ENCOUNTER — Encounter: Payer: Self-pay | Admitting: Pediatrics

## 2019-09-26 ENCOUNTER — Ambulatory Visit: Payer: Medicaid Other | Attending: Obstetrics

## 2019-09-26 ENCOUNTER — Other Ambulatory Visit: Payer: Self-pay

## 2019-09-26 ENCOUNTER — Ambulatory Visit: Payer: Medicaid Other | Admitting: *Deleted

## 2019-09-26 ENCOUNTER — Encounter: Payer: Self-pay | Admitting: *Deleted

## 2019-09-26 DIAGNOSIS — O99212 Obesity complicating pregnancy, second trimester: Secondary | ICD-10-CM

## 2019-09-26 DIAGNOSIS — Z3A23 23 weeks gestation of pregnancy: Secondary | ICD-10-CM

## 2019-09-26 DIAGNOSIS — Z362 Encounter for other antenatal screening follow-up: Secondary | ICD-10-CM | POA: Diagnosis not present

## 2019-09-26 DIAGNOSIS — O322XX0 Maternal care for transverse and oblique lie, not applicable or unspecified: Secondary | ICD-10-CM

## 2019-09-26 DIAGNOSIS — Z8759 Personal history of other complications of pregnancy, childbirth and the puerperium: Secondary | ICD-10-CM | POA: Insufficient documentation

## 2019-09-26 DIAGNOSIS — O0990 Supervision of high risk pregnancy, unspecified, unspecified trimester: Secondary | ICD-10-CM | POA: Insufficient documentation

## 2019-09-26 DIAGNOSIS — O24319 Unspecified pre-existing diabetes mellitus in pregnancy, unspecified trimester: Secondary | ICD-10-CM | POA: Diagnosis not present

## 2019-09-26 DIAGNOSIS — O24312 Unspecified pre-existing diabetes mellitus in pregnancy, second trimester: Secondary | ICD-10-CM

## 2019-09-26 DIAGNOSIS — O34219 Maternal care for unspecified type scar from previous cesarean delivery: Secondary | ICD-10-CM

## 2019-09-28 ENCOUNTER — Other Ambulatory Visit: Payer: Self-pay | Admitting: *Deleted

## 2019-09-28 DIAGNOSIS — O24419 Gestational diabetes mellitus in pregnancy, unspecified control: Secondary | ICD-10-CM

## 2019-10-03 ENCOUNTER — Other Ambulatory Visit: Payer: Medicaid Other

## 2019-10-03 ENCOUNTER — Ambulatory Visit (INDEPENDENT_AMBULATORY_CARE_PROVIDER_SITE_OTHER): Payer: Medicaid Other | Admitting: Obstetrics and Gynecology

## 2019-10-03 ENCOUNTER — Encounter: Payer: Self-pay | Admitting: Obstetrics and Gynecology

## 2019-10-03 ENCOUNTER — Other Ambulatory Visit: Payer: Self-pay

## 2019-10-03 VITALS — BP 101/62 | HR 101 | Wt 319.4 lb

## 2019-10-03 DIAGNOSIS — Z98891 History of uterine scar from previous surgery: Secondary | ICD-10-CM

## 2019-10-03 DIAGNOSIS — O0990 Supervision of high risk pregnancy, unspecified, unspecified trimester: Secondary | ICD-10-CM

## 2019-10-03 DIAGNOSIS — O09299 Supervision of pregnancy with other poor reproductive or obstetric history, unspecified trimester: Secondary | ICD-10-CM

## 2019-10-03 DIAGNOSIS — O0992 Supervision of high risk pregnancy, unspecified, second trimester: Secondary | ICD-10-CM

## 2019-10-03 DIAGNOSIS — Z3009 Encounter for other general counseling and advice on contraception: Secondary | ICD-10-CM | POA: Insufficient documentation

## 2019-10-03 DIAGNOSIS — Z3A24 24 weeks gestation of pregnancy: Secondary | ICD-10-CM

## 2019-10-03 DIAGNOSIS — O34219 Maternal care for unspecified type scar from previous cesarean delivery: Secondary | ICD-10-CM

## 2019-10-03 DIAGNOSIS — Z8759 Personal history of other complications of pregnancy, childbirth and the puerperium: Secondary | ICD-10-CM

## 2019-10-03 NOTE — Patient Instructions (Signed)

## 2019-10-03 NOTE — Progress Notes (Signed)
Subjective:  Veronica Boyle is a 29 y.o. G3P2002 at [redacted]w[redacted]d being seen today for ongoing prenatal care.  She is currently monitored for the following issues for this low-risk pregnancy and has History of fetal abnormality in previous pregnancy, currently pregnant; Previous cesarean delivery affecting pregnancy, antepartum; Paroxysmal supraventricular tachycardia (HCC); Palpitations; Anxiety; Psychogenic syncope; Supervision of high risk pregnancy, antepartum; History of gestational hypertension; BMI 40.0-44.9, adult (HCC); Obesity in pregnancy; Sciatic leg pain; Low vitamin D level; and Unwanted fertility on their problem list.  Patient reports no complaints.  Contractions: Not present. Vag. Bleeding: None.  Movement: Present. Denies leaking of fluid.   The following portions of the patient's history were reviewed and updated as appropriate: allergies, current medications, past family history, past medical history, past social history, past surgical history and problem list. Problem list updated.  Objective:   Vitals:   10/03/19 0859  BP: 101/62  Pulse: (!) 101  Weight: (!) 319 lb 6.4 oz (144.9 kg)    Fetal Status: Fetal Heart Rate (bpm): 145   Movement: Present     General:  Alert, oriented and cooperative. Patient is in no acute distress.  Skin: Skin is warm and dry. No rash noted.   Cardiovascular: Normal heart rate noted  Respiratory: Normal respiratory effort, no problems with respiration noted  Abdomen: Soft, gravid, appropriate for gestational age. Pain/Pressure: Absent     Pelvic:  Cervical exam deferred        Extremities: Normal range of motion.  Edema: Trace  Mental Status: Normal mood and affect. Normal behavior. Normal judgment and thought content.   Urinalysis:      Assessment and Plan:  Pregnancy: G3P2002 at [redacted]w[redacted]d  1. Supervision of high risk pregnancy, antepartum Stable Glucola today  2. Previous cesarean delivery affecting pregnancy, antepartum For repeat at 39  weeks  3. History of gestational hypertension BP stable. Continue with qd BASA  4. History of fetal abnormality in previous pregnancy, currently pregnant Normal Anatomy scan F/U growth scan next month  5. Unwanted fertility BTL papers today  Preterm labor symptoms and general obstetric precautions including but not limited to vaginal bleeding, contractions, leaking of fluid and fetal movement were reviewed in detail with the patient. Please refer to After Visit Summary for other counseling recommendations.  Return in about 4 weeks (around 10/31/2019) for OB visit, virtual, any provider.   Hermina Staggers, MD

## 2019-10-04 LAB — CBC
Hematocrit: 32.8 % — ABNORMAL LOW (ref 34.0–46.6)
Hemoglobin: 10.9 g/dL — ABNORMAL LOW (ref 11.1–15.9)
MCH: 30.4 pg (ref 26.6–33.0)
MCHC: 33.2 g/dL (ref 31.5–35.7)
MCV: 92 fL (ref 79–97)
Platelets: 236 10*3/uL (ref 150–450)
RBC: 3.58 x10E6/uL — ABNORMAL LOW (ref 3.77–5.28)
RDW: 12.7 % (ref 11.7–15.4)
WBC: 8.8 10*3/uL (ref 3.4–10.8)

## 2019-10-04 LAB — HIV ANTIBODY (ROUTINE TESTING W REFLEX): HIV Screen 4th Generation wRfx: NONREACTIVE

## 2019-10-04 LAB — GLUCOSE TOLERANCE, 2 HOURS W/ 1HR
Glucose, 1 hour: 119 mg/dL (ref 65–179)
Glucose, 2 hour: 112 mg/dL (ref 65–152)
Glucose, Fasting: 85 mg/dL (ref 65–91)

## 2019-10-04 LAB — RPR: RPR Ser Ql: NONREACTIVE

## 2019-10-04 MED ORDER — FERROUS SULFATE 325 (65 FE) MG PO TBEC: 325.0000 mg | DELAYED_RELEASE_TABLET | Freq: Every day | ORAL | 6 refills | Status: DC

## 2019-10-04 NOTE — Progress Notes (Signed)
Per Dr. Alysia Penna, patient needs FE ordered, Fe was previously ordered and patient saw message in My Chart.

## 2019-10-05 ENCOUNTER — Encounter (HOSPITAL_COMMUNITY): Payer: Self-pay | Admitting: Obstetrics and Gynecology

## 2019-10-05 ENCOUNTER — Other Ambulatory Visit: Payer: Self-pay

## 2019-10-05 ENCOUNTER — Inpatient Hospital Stay (EMERGENCY_DEPARTMENT_HOSPITAL)
Admission: AD | Admit: 2019-10-05 | Discharge: 2019-10-06 | Disposition: A | Discharge status: Home / Self Care | Payer: Medicaid Other | Source: Home / Self Care | Attending: Obstetrics and Gynecology | Admitting: Obstetrics and Gynecology

## 2019-10-05 DIAGNOSIS — Z3A25 25 weeks gestation of pregnancy: Secondary | ICD-10-CM

## 2019-10-05 DIAGNOSIS — O2686 Pruritic urticarial papules and plaques of pregnancy (PUPPP): Secondary | ICD-10-CM

## 2019-10-05 MED ORDER — PREDNISONE 50 MG PO TABS
50.0000 mg | ORAL_TABLET | Freq: Once | ORAL | Status: AC
  Administered 2019-10-06: 50 mg via ORAL
  Filled 2019-10-05: qty 1

## 2019-10-05 NOTE — MAU Note (Signed)
Pt reports to MAU c/o a rash on her abdomen and sides that is painful. No bleeding or LOF. +FM.

## 2019-10-05 NOTE — MAU Provider Note (Signed)
Chief Complaint:  Rash   First Provider Initiated Contact with Patient 10/05/19 2321      HPI: Veronica Boyle is a 29 y.o. G3P2002 at 16w0dwho presents to maternity admissions reporting uncomfortable rash on her abdomen and back x 2 days. She has tried Benadryl and hydrocortisone cream with no improvement.  She reports itching started within 24 hours of her glucose testing in the office then the rash developed about 48 hours later.  There are no other associated symptoms. She reports good fetal movement.   HPI  Past Medical History: Past Medical History:  Diagnosis Date  . Arrhythmia   . Atypical chest pain 11/15/2014   Seen by [facility] 2016 and stress echo done.   . Gestational hypertension   . Prediabetes   . SVT (supraventricular tachycardia) (HCC)     Past obstetric history: OB History  Gravida Para Term Preterm AB Living  _0 0 0 2  SAB TAB Ectopic Multiple Live Births  0 0 0 0 2    # Outcome Date GA Lbr Len/2nd Weight Sex Delivery Anes PTL Lv  3 Current           2 Term 01/25/14 [id number] g F CS-LTranv EPI  LIV     Birth Comments: repeat C/S ; signs of preeclampsia- admitted from ob visit for IOL but c/s for FTP  1 Term 10/11/11 [id number] g F CS-LTranv Spinal N LIV     Birth Comments: wnl    Past Surgical History: Past Surgical History:  Procedure Laterality Date  . CESAREAN SECTION    . CESAREAN SECTION N/A 01/25/2014   Procedure: CESAREAN SECTION;  Surgeon: JohJonnie KindD;  [place] [place];  Service: Obstetrics;  Laterality: N/A;  . TONSILLECTOMY    . WISDOM TOOTH EXTRACTION  2012    Family History: Family History  Problem Relation Age of Onset  . Hyperlipidemia Mother   . Hypertension Mother   . Diabetes Mother   . Asthma Father   . Asthma Sister   . Asthma Brother   . Birth defects Daughter        right renal duplicated collecting system    Social History: Social History   Tobacco Use  . Smoking status: Never Smoker  . Smokeless  tobacco: Never Used  Vaping Use  . Vaping Use: Never used  Substance Use Topics  . Alcohol use: No  . Drug use: No    Allergies:  Allergies  Allergen Reactions  . Vancomycin Other (See Comments)    Reaction:  Red man syndrome    Meds:  No medications prior to admission.    ROS:  Review of Systems  Constitutional: Negative for chills, fatigue and fever.  Eyes: Negative for visual disturbance.  Respiratory: Negative for shortness of breath.   Cardiovascular: Negative for chest pain.  Gastrointestinal: Negative for abdominal pain, nausea and vomiting.  Genitourinary: Negative for difficulty urinating, dysuria, flank pain, pelvic pain, vaginal bleeding, vaginal discharge and vaginal pain.  Skin: Positive for rash.  Neurological: Negative for dizziness and headaches.  Psychiatric/Behavioral: Negative.      I have reviewed patient's Past Medical Hx, Surgical Hx, Family Hx, Social Hx, medications and allergies.   Physical Exam   Patient Vitals for the past 24 hrs:  BP Temp Temp src Pulse Resp  10/06/19 0052 100/72 -- -- (!) 134 --  10/05/19 2247 (!) 126/56 -- -- 90 --  10/05/19 2231 124/69 98.7 F (37.1  C) Oral 96 17   Constitutional: Well-developed, well-nourished female in no acute distress.  Cardiovascular: normal rate Respiratory: normal effort GI: Abd soft, non-tender, gravid appropriate for gestational age.  MS: Extremities nontender, no edema, normal ROM Neurologic: Alert and oriented x 4.  Integumentary: Diffuse erythema, not well demarcated, superimposed on hyperpigmented skin on the abdomen, bilateral flanks, and low back.  Erythema is more pronounced on the striae.  Blanching is present. There are no vesicles, pustules, macules, erosions, or ulcerations.  GU: Neg CVAT.  PELVIC EXAM: Deferred     FHT:  Baseline 140 , moderate variability, accelerations present, no decelerations Contractions: none on toco or to palpation   Labs: No results found for this  or any previous visit (from the past 24 hour(s)). O/Positive/-- (04/26 1228)  Imaging:  Media Information   Document Information  Photos  Abdomen  10/05/2019 23:33  Attached To:  Hospital Encounter on 10/05/19  Source Information  Sparacino, Dan Europe, DO  [facility]   Media Information   Document Information  Photos  Close up abdomen  10/05/2019 23:33  Attached To:  Hospital Encounter on 10/05/19  Source Information  Merilyn Baba, DO  [facility]   Media Information   Document Information  Photos  Left flank  10/05/2019 23:32  Attached To:  Hospital Encounter on 10/05/19  Source Information  Sparacino, Hailey L, DO  [facility]      MAU Course/MDM: Orders Placed This Encounter  Procedures  . Discharge patient    Meds ordered this encounter  Medications  . predniSONE (DELTASONE) tablet 50 mg  . predniSONE (DELTASONE) 50 MG tablet    Sig: Take 1 tablet (50 mg total) by mouth daily for 5 days.    Dispense:  5 tablet    Refill:  0    Order Specific Question:   Supervising Provider    Answer:   CONSTANT, PEGGY [[id number]]     NST reviewed and appropriate for gestational age Dr Darene Lamer in room to evaluate patient with CNM.  Rash consistent with PUPPP, not resolved with Benadryl, Zyrtec, and topical steroids.  Of note, pt had glucola 1 day prior to urticaria, 2 days prior to visible rash so may have allergic component to autoimmune response.  Will treat with prednisone daily x 5 days, pt to f/u in office early next week.  Referral to dermatology if not improved.  Treatments in MAU included first dose of Prednisone 50 mg PO.   Pt discharge with strict return precautions.    Assessment: 1. PUPPP (pruritic urticarial papules and plaques of pregnancy)     Plan: Discharge home Labor precautions and fetal kick counts  Follow-up [place] for [place] at [facility] Follow  up.   Specialty: Obstetrics and Gynecology Why: On 6/29 or 6/30 for follow up next week. Return to MAU for worsening symptoms.  Contact information: [address] [place] [address] [phone]             Allergies as of 10/06/2019      Reactions   Vancomycin Other (See Comments)   Reaction:  Red man syndrome      Medication List    TAKE these medications   acetaminophen 500 MG tablet Commonly known as: TYLENOL Take 1,000 mg by mouth every 6 (six) hours as needed for headache.   aspirin EC 81 MG tablet Take 1 tablet (81 mg total) by mouth daily.  Blood Pressure Kit Devi 1 Device by Does not apply route as needed.   cetirizine 10 MG tablet Commonly known as: ZYRTEC Take 10 mg by mouth daily.   ferrous sulfate 325 (65 FE) MG EC tablet Take 1 tablet (325 mg total) by mouth daily with breakfast.   predniSONE 50 MG tablet Commonly known as: DELTASONE Take 1 tablet (50 mg total) by mouth daily for 5 days.   PrePLUS 27-1 MG Tabs Take 1 tablet by mouth daily.   Vitamin D (Ergocalciferol) 1.25 MG (50000 UNIT) Caps capsule Commonly known as: DRISDOL Take 1 capsule (50,000 Units total) by mouth every 7 (seven) days for 12 doses.       Fatima Blank Certified Nurse-Midwife 10/06/2019 3:11 AM

## 2019-10-06 ENCOUNTER — Inpatient Hospital Stay (HOSPITAL_COMMUNITY)
Admission: AD | Admit: 2019-10-06 | Discharge: 2019-10-06 | Disposition: A | Discharge status: Skilled Nursing Facility | Payer: Medicaid Other | Source: Home / Self Care | Attending: Obstetrics and Gynecology | Admitting: Obstetrics and Gynecology

## 2019-10-06 ENCOUNTER — Other Ambulatory Visit: Payer: Self-pay

## 2019-10-06 ENCOUNTER — Encounter (HOSPITAL_COMMUNITY): Payer: Self-pay | Admitting: Obstetrics and Gynecology

## 2019-10-06 ENCOUNTER — Encounter (HOSPITAL_COMMUNITY): Payer: Self-pay

## 2019-10-06 ENCOUNTER — Emergency Department (HOSPITAL_COMMUNITY)
Admission: EM | Admit: 2019-10-06 | Discharge: 2019-10-06 | Disposition: A | Discharge status: Home / Self Care | Payer: Medicaid Other | Attending: Emergency Medicine | Admitting: Emergency Medicine

## 2019-10-06 DIAGNOSIS — R002 Palpitations: Secondary | ICD-10-CM | POA: Diagnosis not present

## 2019-10-06 DIAGNOSIS — Z3A25 25 weeks gestation of pregnancy: Secondary | ICD-10-CM | POA: Diagnosis not present

## 2019-10-06 DIAGNOSIS — I471 Supraventricular tachycardia: Secondary | ICD-10-CM | POA: Insufficient documentation

## 2019-10-06 DIAGNOSIS — O26892 Other specified pregnancy related conditions, second trimester: Secondary | ICD-10-CM | POA: Insufficient documentation

## 2019-10-06 DIAGNOSIS — O2686 Pruritic urticarial papules and plaques of pregnancy (PUPPP): Secondary | ICD-10-CM | POA: Diagnosis not present

## 2019-10-06 DIAGNOSIS — R0602 Shortness of breath: Secondary | ICD-10-CM | POA: Insufficient documentation

## 2019-10-06 DIAGNOSIS — Z8759 Personal history of other complications of pregnancy, childbirth and the puerperium: Secondary | ICD-10-CM

## 2019-10-06 DIAGNOSIS — R000 Tachycardia, unspecified: Secondary | ICD-10-CM

## 2019-10-06 DIAGNOSIS — R200 Anesthesia of skin: Secondary | ICD-10-CM | POA: Diagnosis present

## 2019-10-06 DIAGNOSIS — R202 Paresthesia of skin: Secondary | ICD-10-CM

## 2019-10-06 MED ORDER — PREDNISONE 50 MG PO TABS: 50.0000 mg | ORAL_TABLET | Freq: Every day | ORAL | 0 refills | Status: AC

## 2019-10-06 NOTE — Discharge Instructions (Addendum)
You were most likely in SVT this morning which is what was causing the palpitations and your shortness of breath.  Now you are in a regular rhythm and your heart rate is normal.  We cannot be for sure because we did not catch it on an EKG however if you start developing persistent chest pain or shortness of breath or the palpitations return you should return to the emergency room for further evaluation.  You should also follow-up with your OB if you have any further episodes.  Also if you have any further episodes you should discontinue the prednisone.

## 2019-10-06 NOTE — MAU Note (Signed)
Patient was seen in MAU last night for PUPPS rash and sent home around midnight after a dose of Prednisone.  She woke up this morning around 0500 with bilateral upper and lower extremity and facial "numbness" as well as "tightness" around abdomen and somewhat around chest.

## 2019-10-06 NOTE — MAU Provider Note (Signed)
History     CSN: [id number]  Arrival date and time: 10/06/19 0746   First Provider Initiated Contact with Patient 10/06/19 0813      Chief Complaint  Patient presents with  . Numbness  . Chest Pain   HPI  Ms.Veronica Boyle is a 29 y.o. female G3P2002 @ 71w1dhere in MAU with complaints of new onset SOB, numbness in face, weakness and tingling in upper arms and hands. States all of the symptoms started at 0500. She was seen last night at 2300 and diagnosed with PUPPS rash and was given oral prednisone. She has taken one dose.  States she has never had these symptoms before. States her SOB and breathing is bothering her more than anything else. No headache.   OB History    Gravida  3   Para  2   Term  2   Preterm  0   AB  0   Living  2     SAB  0   TAB  0   Ectopic  0   Multiple  0   Live Births  2           Past Medical History:  Diagnosis Date  . Arrhythmia   . Atypical chest pain 11/15/2014   [facility] and stress echo done.   . Gestational hypertension   . Prediabetes   . SVT (supraventricular tachycardia) (HCC)     Past Surgical History:  Procedure Laterality Date  . CESAREAN SECTION    . CESAREAN SECTION N/A 01/25/2014   Procedure: CESAREAN SECTION;  Surgeon: JJonnie Kind MD;  [facility];  Service: Obstetrics;  Laterality: N/A;  . TONSILLECTOMY    . WISDOM TOOTH EXTRACTION  2012    Family History  Problem Relation Age of Onset  . Hyperlipidemia Mother   . Hypertension Mother   . Diabetes Mother   . Asthma Father   . Asthma Sister   . Asthma Brother   . Birth defects Daughter        right renal duplicated collecting system    Social History   Tobacco Use  . Smoking status: Never Smoker  . Smokeless tobacco: Never Used  Vaping Use  . Vaping Use: Never used  Substance Use Topics  . Alcohol use: No  . Drug use: No    Allergies:  Allergies  Allergen Reactions  . Vancomycin Other (See Comments)    Reaction:   Red man syndrome    Medications Prior to Admission  Medication Sig Dispense Refill Last Dose  . acetaminophen (TYLENOL) 500 MG tablet Take 1,000 mg by mouth every 6 (six) hours as needed for headache.      .Marland Kitchenaspirin EC 81 MG tablet Take 1 tablet (81 mg total) by mouth daily. 60 tablet 2   . Blood Pressure Monitoring (BLOOD PRESSURE KIT) DEVI 1 Device by Does not apply route as needed. 1 each 0   . cetirizine (ZYRTEC) 10 MG tablet Take 10 mg by mouth daily.     . ferrous sulfate 325 (65 FE) MG EC tablet Take 1 tablet (325 mg total) by mouth daily with breakfast. 30 tablet 6   . predniSONE (DELTASONE) 50 MG tablet Take 1 tablet (50 mg total) by mouth daily for 5 days. 5 tablet 0   . Prenatal Vit-Fe Fumarate-FA (PREPLUS) 27-1 MG TABS Take 1 tablet by mouth daily. 90 tablet 3   . Vitamin D, Ergocalciferol, (DRISDOL) 1.25 MG (50000 UNIT)  CAPS capsule Take 1 capsule (50,000 Units total) by mouth every 7 (seven) days for 12 doses. 12 capsule 0    No results found for this or any previous visit (from the past 48 hour(s)).  Review of Systems  Respiratory: Positive for chest tightness and shortness of breath.   Cardiovascular: Negative for chest pain.  Neurological: Positive for weakness and numbness. Negative for dizziness, facial asymmetry, speech difficulty, light-headedness and headaches.  Psychiatric/Behavioral: The patient is nervous/anxious.    Physical Exam   Blood pressure 138/75, pulse 98, temperature 98.2 F (36.8 C), temperature source Oral, resp. rate 20, SpO2 98 %.  Patient Vitals for the past 24 hrs:  BP Temp Temp src Pulse Resp SpO2  10/06/19 0823 123/66 -- -- 100 (!) 22 --  10/06/19 0805 -- -- -- 98 -- 98 %  10/06/19 0800 138/75 98.2 F (36.8 C) Oral (!) 106 20 100 %   Physical Exam  Constitutional: She is oriented to person, place, and time. She appears well-developed.  Non-toxic appearance. She does not appear ill. No distress.  HENT:  Head: Normocephalic.  Eyes: Pupils  are equal, round, and reactive to light.  Cardiovascular: Tachycardia present.  Respiratory: Breath sounds normal. No stridor. Tachypnea noted.  Musculoskeletal:        General: Normal range of motion.  Neurological: She is alert and oriented to person, place, and time. She displays weakness.  Skin: Skin is warm. She is not diaphoretic.  Psychiatric: Mood normal.   Fetal Tracing: Baseline: 145 bpm Variability: Moderate  Accelerations: 10x10 Decelerations: None Toco: Quiet   MAU Course  Procedures  EGK- Normal, sinus tachycardia   MDM  Medical screen done Given patient's past medical history of morbid obesity, SVT, GHTN, chest pain, arrhythmia she will be transferred to [facility] ED for further cardiac work up. She is stable for transport. Discussed patient with Dr. Tyrone Nine in [facility] ED who is accepting the patient's transfer.   Assessment and Plan   A:  1. Tachycardia   2. History of gestational hypertension   3. Shortness of breath   4. Numbness and tingling in both hands     P:  Transfer to [facility] ED for further cardiac workup Patient stable for transfer NST- Category 1   Nicki Gracy, Artist Pais, NP 10/06/2019 9:10 AM

## 2019-10-06 NOTE — ED Notes (Signed)
Patient verbalizes understanding of discharge instructions. Opportunity for questioning and answers were provided. Armband removed by staff, pt discharged from ED to home 

## 2019-10-06 NOTE — ED Provider Notes (Signed)
[facility] EMERGENCY DEPARTMENT Provider Note   CSN: [id number] Arrival date & time: 10/06/19  5035     History Chief Complaint  Patient presents with  . Shortness of Breath    Veronica Boyle is a 29 y.o. female.  Patient is a 29 year old female G3, P2 who is currently 25 weeks and 3 days pregnant who is presenting from MAU for evaluation of palpitations and shortness of breath.  Patient reports she was seen MAU yesterday due to a rash and was discharged late last night with prednisone for a pups rash over her abdomen and back that had been causing significant itching and discomfort.  She did take 1 prednisone before going to bed and woke up at 5 AM this morning with sensation of tingling in her face and bilateral arms.  She states her heart was beating fast and she felt short of breath.  And driving up here she had an episode of feeling like she might pass out and was doing breath-holding like she had done in the past when she had episodes of SVT.  Patient did go to MAU initially and was checked out.  Initially when she arrived heart rate was 106 but she reports even upon arrival here she started feeling better.  Fetus was evaluated in evaluation was normal.  EKG at MAU without acute findings.  She was sent here for further evaluation.  Upon evaluation here patient reports she feels back to normal.  She denies any shortness of breath or chest discomfort.  She states her arms and face now feel normal.  She walked back to the exam room and states other than feeling tired because she has not had any sleep she feels fine she is hungry and she wants to go home.  The history is provided by the patient.  Shortness of Breath      Past Medical History:  Diagnosis Date  . Arrhythmia   . Atypical chest pain 11/15/2014   [facility] and [facility] done.   . Gestational hypertension   . Prediabetes   . SVT (supraventricular tachycardia) [facility])     Patient Active Problem  List   Diagnosis Date Noted  . Tachycardia 10/06/2019  . Shortness of breath 10/06/2019  . Numbness and tingling in both hands 10/06/2019  . Unwanted fertility 10/03/2019  . Low vitamin D level 09/14/2019  . BMI 40.0-44.9, adult ([place]) 08/07/2019  . Obesity in pregnancy 08/07/2019  . Sciatic leg pain 08/07/2019  . Supervision of high risk pregnancy, antepartum 07/31/2019  . History of gestational hypertension 07/31/2019  . Paroxysmal supraventricular tachycardia ([place]) 11/15/2014  . Palpitations 11/15/2014  . Anxiety 11/15/2014  . 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 History of fetal abnormality in previous pregnancy, currently pregnant 08/16/2013  . Previous cesarean delivery affecting pregnancy, antepartum 08/16/2013    Past Surgical History:  Procedure Laterality Date  . CESAREAN SECTION    . CESAREAN SECTION N/A 01/25/2014   Procedure: CESAREAN SECTION;  Surgeon: Jonnie Kind, MD;  Location: [facility];  Service: Obstetrics;  Laterality: N/A;  . TONSILLECTOMY    . WISDOM TOOTH EXTRACTION  2012     OB History    Gravida  3   Para  2   Term  2   Preterm  0   AB  0   Living  2     SAB  0   TAB  0   Ectopic  0   Multiple  0  Live Births  2           Family History  Problem Relation Age of Onset  . Hyperlipidemia Mother   . Hypertension Mother   . Diabetes Mother   . Asthma Father   . Asthma Sister   . Asthma Brother   . Birth defects Daughter        right renal duplicated collecting system    Social History   Tobacco Use  . Smoking status: Never Smoker  . Smokeless tobacco: Never Used  Vaping Use  . Vaping Use: Never used  Substance Use Topics  . Alcohol use: No  . Drug use: No    Home Medications Prior to Admission medications   Medication Sig Start Date End Date Taking? Authorizing Provider  acetaminophen (TYLENOL) 500 MG tablet Take 1,000 mg by mouth every 6 (six) hours as needed for headache.     [provider]  aspirin EC  81 MG tablet Take 1 tablet (81 mg total) by mouth daily. 08/07/19   Aletha Halim, MD  Blood Pressure Monitoring (BLOOD PRESSURE KIT) DEVI 1 Device by Does not apply route as needed. 07/31/19   Aletha Halim, MD  cetirizine (ZYRTEC) 10 MG tablet Take 10 mg by mouth daily.    [provider]  ferrous sulfate 325 (65 FE) MG EC tablet Take 1 tablet (325 mg total) by mouth daily with breakfast. 10/04/19   Chancy Milroy, MD  predniSONE (DELTASONE) 50 MG tablet Take 1 tablet (50 mg total) by mouth daily for 5 days. 10/06/19 10/11/19  Leftwich-Kirby, Kathie Dike, CNM  Prenatal Vit-Fe Fumarate-FA (PREPLUS) 27-1 MG TABS Take 1 tablet by mouth daily. 08/28/19   Truett Mainland, DO  Vitamin D, Ergocalciferol, (DRISDOL) 1.25 MG (50000 UNIT) CAPS capsule Take 1 capsule (50,000 Units total) by mouth every 7 (seven) days for 12 doses. 09/14/19 12/01/19  Aletha Halim, MD    Allergies    Vancomycin  Review of Systems   Review of Systems  Respiratory: Positive for shortness of breath.   All other systems reviewed and are negative.   Physical Exam Updated Vital Signs BP 126/75   Pulse 88   Temp 98.2 F (36.8 C) (Oral)   Resp 18   LMP  (LMP Unknown)   SpO2 100%   Physical Exam Vitals and nursing note reviewed.  Constitutional:      General: She is not in acute distress.    Appearance: She is well-developed. She is obese.  HENT:     Head: Normocephalic and atraumatic.  Eyes:     Conjunctiva/sclera: Conjunctivae normal.     Pupils: Pupils are equal, round, and reactive to light.  Cardiovascular:     Rate and Rhythm: Normal rate and regular rhythm.     Heart sounds: No murmur heard.   Pulmonary:     Effort: Pulmonary effort is normal. No respiratory distress.     Breath sounds: Normal breath sounds. No wheezing or rales.  Abdominal:     General: There is no distension.     Palpations: Abdomen is soft.     Tenderness: There is no abdominal tenderness. There is no guarding or rebound.   Musculoskeletal:        General: No tenderness. Normal range of motion.     Cervical back: Normal range of motion and neck supple.     Right lower leg: No edema.     Left lower leg: No edema.  Skin:    General:  Skin is warm and dry.     Findings: Rash present. No erythema.     Comments: Raised confluent papular rash over the abdomen and lower back  Neurological:     Mental Status: She is alert and oriented to person, place, and time.  Psychiatric:        Behavior: Behavior normal.     ED Results / Procedures / Treatments   Labs (all labs ordered are listed, but only abnormal results are displayed) Labs Reviewed - No data to display  EKG EKG Interpretation  Date/Time:  Friday October 06 2019 09:35:43 EDT Ventricular Rate:  91 PR Interval:  130 QRS Duration: 74 QT Interval:  358 QTC Calculation: 440 R Axis:   66 Text Interpretation: Normal sinus rhythm Normal ECG No significant change since last tracing Confirmed by Blanchie Dessert [phone]) on 10/06/2019 11:24:28 AM   Radiology No results found.  Procedures Procedures (including critical care time)  Medications Ordered in ED Medications - No data to display  ED Course  I have reviewed the triage vital signs and the nursing notes.  Pertinent labs & imaging results that were available during my care of the patient were reviewed by me and considered in my medical decision making (see chart for details).    MDM Rules/Calculators/A&P                          Patient presenting today for evaluation for palpitations and shortness of breath.  Patient was started on prednisone last night for a pups rash over the abdomen and back.  Patient reports that she woke up this morning at 5 AM with palpitations and tingling in her face and arms.  She does have prior history of SVT which she states felt similar to the event this morning.  Even on the drive over here she started having some lightheadedness and started doing breath-holding  and deep breathing.  Suspect she most likely converted upon arrival to the hospital.  She reports her symptoms are completely resolved at this time.  EKG at MAU showed sinus tachycardia and here now she has normal rate and EKG.  Patient has no significant findings on exam except for the pups rash.  SVT possibly triggered by recent initiation of prednisone versus increased risk while pregnant.  She takes no medication chronically for SVT.  She has no chest pain or shortness of breath at this time no leg swelling and normal blood pressure.  Low suspicion for clot given patient's history and exam.  Patient also had labs done 3 days ago that showed a hemoglobin of 10.9 and less than a month ago had normal renal function.  No evidence concerning for preeclampsia, anemia or acute renal issue today.  Findings discussed with the patient and encouraged follow-up with cardiology if she has any further events.  Also returning to the emergency room if her symptoms return. Final Clinical Impression(s) / ED Diagnoses Final diagnoses:  Palpitations  SVT (supraventricular tachycardia) ([place])    Rx / DC Orders ED Discharge Orders    None       Blanchie Dessert, MD 10/06/19 1151

## 2019-10-06 NOTE — ED Triage Notes (Signed)
Pt reports sob and facial numbness since 5 am this morning, pt also having tingling in both her arms. Pt given new rx of prednisone for a rash on her abd yesterday first dose taken around 1230. Pt [redacted] weeks pregnant, sent over from MAU for further evaluation. Pt reports anxiety from her symptoms. Resp e.u at this time.

## 2019-10-06 NOTE — Progress Notes (Signed)
Pt talking excitedly

## 2019-10-09 ENCOUNTER — Telehealth (INDEPENDENT_AMBULATORY_CARE_PROVIDER_SITE_OTHER): Payer: Medicaid Other

## 2019-10-09 DIAGNOSIS — R030 Elevated blood-pressure reading, without diagnosis of hypertension: Secondary | ICD-10-CM

## 2019-10-09 DIAGNOSIS — O2686 Pruritic urticarial papules and plaques of pregnancy (PUPPP): Secondary | ICD-10-CM

## 2019-10-09 DIAGNOSIS — O99891 Other specified diseases and conditions complicating pregnancy: Secondary | ICD-10-CM

## 2019-10-09 NOTE — Telephone Encounter (Addendum)
Elevated BP alert received from [employer] this AM for an elevated BP on 10/06/19. Per chart review pt was seen in MAU and ED on that day. Called pt; pt reports symptoms are much improved since that time. States she has not checked BP since, encouraged pt to check. Pt plans to check BP and log into [employer] when she is home from work. Pt states she was told to follow up with her cardiologist now or if she has another event. Encouraged pt to go ahead and make an appointment now. Pt states she will be unavailable for nurse visit on 10/11/19, which is for follow up of PUPPP rash. Pt states her rash is much improved, less itching each day. States she took one dose of prednisone but discontinued because of suspected allergic reaction. Pt requests to follow up on 10/24/19 when she will already be in the office.   I explained I can review with a provider and let her know if she needs to keep appt on 10/11/19.

## 2019-10-10 NOTE — Telephone Encounter (Signed)
Reviewed with [place], DO who states pt needs to be seen in the office by a nurse before the end of the week. Called pt with recommendation. Pt agrees to come for nurse visit Friday at 1030.

## 2019-10-11 ENCOUNTER — Telehealth: Payer: Self-pay | Admitting: *Deleted

## 2019-10-11 ENCOUNTER — Ambulatory Visit: Payer: Self-pay

## 2019-10-11 NOTE — Telephone Encounter (Signed)
Attempted to call pt on several occasions in the last week but always get message that voicemail is not set up. Pt is to call for appt after being seen in the ED if she has any further issues.

## 2019-10-13 ENCOUNTER — Other Ambulatory Visit: Payer: Self-pay

## 2019-10-13 ENCOUNTER — Encounter: Payer: Self-pay | Admitting: *Deleted

## 2019-10-13 ENCOUNTER — Ambulatory Visit (INDEPENDENT_AMBULATORY_CARE_PROVIDER_SITE_OTHER): Payer: Medicaid Other | Admitting: *Deleted

## 2019-10-13 VITALS — BP 126/71 | HR 64 | Ht 65.0 in | Wt 322.9 lb

## 2019-10-13 DIAGNOSIS — O2686 Pruritic urticarial papules and plaques of pregnancy (PUPPP): Secondary | ICD-10-CM

## 2019-10-13 DIAGNOSIS — Z0130 Encounter for examination of blood pressure without abnormal findings: Secondary | ICD-10-CM

## 2019-10-13 NOTE — Progress Notes (Signed)
Pt here for BP check and recheck of PUPPS rash.  Pt states she believes her BP machine may not be accurate as she frequently gets readings >140-90, however when goes to MAU or office, the BP is normal. She forgot to bring machine to visit today for comparison. She denies having H/A or visual disturbances. BP was taken twice; in Forearm with arm @ chest level - 113/63, P- 64; then in Upper arm - 126/71, P - 96. Pt was advised to bring her BP machine to MFM visit on 7/13 for comparison. Pt reports significant improvement of PUPPS rash on abdomen and back. She states she has no more itching. She attributes the improvement to the use of hydrocortisone cream topically and she has continued to take Zyrtec daily as well. Pt had only taken one dose of the prednisone which had been prescribed.  I examined pt abdomen and found no rash present. She has a small area of raised, pink rash in center of lower back which pt is aware of however states does not cause discomfort or itching. Pt will keep appts for growth [place] and prenatal care as scheduled.

## 2019-10-17 ENCOUNTER — Ambulatory Visit (INDEPENDENT_AMBULATORY_CARE_PROVIDER_SITE_OTHER): Payer: Medicaid Other | Admitting: Cardiology

## 2019-10-17 ENCOUNTER — Ambulatory Visit (INDEPENDENT_AMBULATORY_CARE_PROVIDER_SITE_OTHER): Payer: Medicaid Other

## 2019-10-17 ENCOUNTER — Encounter: Payer: Self-pay | Admitting: Cardiology

## 2019-10-17 ENCOUNTER — Other Ambulatory Visit: Payer: Self-pay

## 2019-10-17 VITALS — BP 126/66 | HR 113 | Ht 65.0 in | Wt 327.0 lb

## 2019-10-17 DIAGNOSIS — R7303 Prediabetes: Secondary | ICD-10-CM | POA: Diagnosis not present

## 2019-10-17 DIAGNOSIS — R002 Palpitations: Secondary | ICD-10-CM | POA: Diagnosis not present

## 2019-10-17 DIAGNOSIS — O09299 Supervision of pregnancy with other poor reproductive or obstetric history, unspecified trimester: Secondary | ICD-10-CM | POA: Diagnosis not present

## 2019-10-17 DIAGNOSIS — R000 Tachycardia, unspecified: Secondary | ICD-10-CM

## 2019-10-17 LAB — TSH: TSH: 0.869 u[IU]/mL (ref 0.450–4.500)

## 2019-10-17 NOTE — Progress Notes (Signed)
Cardiology Office Note:    Date:  10/17/2019   ID:  Veronica Boyle, DOB May 10, 1990, MRN [id number]  PCP:  Patient, No Pcp Per  Cardiologist:  Berniece Salines, DO  Electrophysiologist:  None   Referring MD: No ref. provider found   Chief Complaint  Patient presents with  . New Patient (Initial Visit)    History of Present Illness:    Veronica Boyle is a 29 y.o. female with a hx of prediabetes, obesity, history of gestational hypertension and history of preeclampsia.  The patient is currently pregnant she is 27 weeks (G3, P2).  She tells me that she has history of palpitations which have been told in the past to be SVT.  She was on metoprolol for a little bit in 2016.  She stopped this medication since 2016 and has been doing well.  She reported that recently she had some pups rash and was seen in the hospital at which time she was given prednisone.  She reported that she took this prednisone as prescribed and noted that the morning of her going to the emergency department on 10/06/2019 she started to experience increased heartbeat.  She notes that it was abrupt he tells me that it felt like an anxiety attack.  She reported did not feel like her prior episodes of SVT where she would pass out.  She did feel significantly fatigued.  On presentation to the emergency department her heart rate was noted to be 106 bpm and also noted to improve significantly.  At that time the fetal heart rate was also normal.  She was then asked to see cardiology in consultation.  She has not had any repeated episodes since that visit.  She denies any chest pain, syncope episodes or any shortness of breath.  Past Medical History:  Diagnosis Date  . Arrhythmia   . Atypical chest pain 11/15/2014   [facility] 2016 and stress [facility] done.   . Gestational hypertension   . Prediabetes   . SVT (supraventricular tachycardia) (HCC)     Past Surgical History:  Procedure Laterality Date  . CESAREAN SECTION    . CESAREAN  SECTION N/A 01/25/2014   Procedure: CESAREAN SECTION;  Surgeon: Jonnie Kind, MD;  Location: [facility];  Service: Obstetrics;  Laterality: N/A;  . TONSILLECTOMY    . WISDOM TOOTH EXTRACTION  2012    Current Medications: Current Meds  Medication Sig  . acetaminophen (TYLENOL) 500 MG tablet Take 1,000 mg by mouth every 6 (six) hours as needed for headache.   Marland Kitchen aspirin EC 81 MG tablet Take 1 tablet (81 mg total) by mouth daily.  . Blood Pressure Monitoring (BLOOD PRESSURE KIT) DEVI 1 Device by Does not apply route as needed.  . cetirizine (ZYRTEC) 10 MG tablet Take 10 mg by mouth daily.  . ferrous sulfate 325 (65 FE) MG EC tablet Take 1 tablet (325 mg total) by mouth daily with breakfast.  . Prenatal Vit-Fe Fumarate-FA (PREPLUS) 27-1 MG TABS Take 1 tablet by mouth daily.  . Vitamin D, Ergocalciferol, (DRISDOL) 1.25 MG (50000 UNIT) CAPS capsule Take 1 capsule (50,000 Units total) by mouth every 7 (seven) days for 12 doses.  . [DISCONTINUED] Prenatal Vit-Fe Fumarate-FA (PX PRENATAL MULTIVITAMINS PO) Take by mouth.     Allergies:   Vancomycin   Social History   Socioeconomic History  . Marital status: Single    Spouse name: Not on file  . Number of children: Not on file  . Years  of education: Not on file  . Highest education level: Not on file  Occupational History  . Not on file  Tobacco Use  . Smoking status: Never Smoker  . Smokeless tobacco: Never Used  Vaping Use  . Vaping Use: Never used  Substance and Sexual Activity  . Alcohol use: No  . Drug use: No  . Sexual activity: Yes    Birth control/protection: None  Other Topics Concern  . Not on file  Social History Narrative  . Not on file   Social Determinants of Health   Financial Resource Strain:   . Difficulty of Paying Living Expenses:   Food Insecurity: No Food Insecurity  . Worried About Charity fundraiser in the Last Year: Never true  . Ran Out of Food in the Last Year: Never true  Transportation Needs: No  Transportation Needs  . Lack of Transportation (Medical): No  . Lack of Transportation (Non-Medical): No  Physical Activity:   . Days of Exercise per Week:   . Minutes of Exercise per Session:   Stress:   . Feeling of Stress :   Social Connections:   . Frequency of Communication with Friends and Family:   . Frequency of Social Gatherings with Friends and Family:   . Attends Religious Services:   . Active Member of Clubs or Organizations:   . Attends Archivist Meetings:   Marland Kitchen Marital Status:      Family History: The patient's family history includes Asthma in her brother, father, and sister; Birth defects in her daughter; Diabetes in her mother; Hyperlipidemia in her mother; Hypertension in her mother.  ROS:   Review of Systems  Constitution: Negative for decreased appetite, fever and weight gain.  HENT: Negative for congestion, ear discharge, hoarse voice and sore throat.   Eyes: Negative for discharge, redness, vision loss in right eye and visual halos.  Cardiovascular: Reports palpitations.  Negative for chest pain, dyspnea on exertion, leg swelling, orthopnea.  Respiratory: Negative for cough, hemoptysis, shortness of breath and snoring.   Endocrine: Negative for heat intolerance and polyphagia.  Hematologic/Lymphatic: Negative for bleeding problem. Does not bruise/bleed easily.  Skin: Negative for flushing, nail changes, rash and suspicious lesions.  Musculoskeletal: Negative for arthritis, joint pain, muscle cramps, myalgias, neck pain and stiffness.  Gastrointestinal: Negative for abdominal pain, bowel incontinence, diarrhea and excessive appetite.  Genitourinary: Negative for decreased libido, genital sores and incomplete emptying.  Neurological: Negative for brief paralysis, focal weakness, headaches and loss of balance.  Psychiatric/Behavioral: Negative for altered mental status, depression and suicidal ideas.  Allergic/Immunologic: Negative for HIV exposure and  persistent infections.    EKGs/Labs/Other Studies Reviewed:    The following studies were reviewed today:   EKG:  The ekg ordered today demonstrates sinus tachycardia, heart rate 113 bpm, compared to prior EKG done on 10/06/2019 at which time the patient was normal sinus rhythm.  Recent Labs: 01/19/2019: TSH 1.850 09/08/2019: ALT 18; BUN 5; Creatinine, Ser 0.48; Potassium 3.6; Sodium 137 10/03/2019: Hemoglobin 10.9; Platelets 236  Recent Lipid Panel    Component Value Date/Time   CHOL 159 01/19/2019 1157   TRIG 255 (H) 01/19/2019 1157   HDL 33 (L) 01/19/2019 1157   CHOLHDL 4.8 (H) 01/19/2019 1157   LDLCALC 84 01/19/2019 1157    Physical Exam:    VS:  BP 126/66   Pulse (!) 113   Ht _0  (1.651 m)   Wt (!) 327 lb (148.3 kg)   LMP  (LMP  Unknown)   SpO2 97%   BMI 54.42 kg/m     Wt Readings from Last 3 Encounters:  10/17/19 (!) 327 lb (148.3 kg)  10/13/19 (!) 322 lb 14.4 oz (146.5 kg)  10/03/19 (!) 319 lb 6.4 oz (144.9 kg)     GEN: Well nourished, well developed in no acute distress HEENT: Normal NECK: No JVD; No carotid bruits LYMPHATICS: No lymphadenopathy CARDIAC: S1S2 noted,RRR, no murmurs, rubs, gallops RESPIRATORY:  Clear to auscultation without rales, wheezing or rhonchi  ABDOMEN: Soft, non-tender, non-distended, +bowel sounds, no guarding. EXTREMITIES: No edema, No cyanosis, no clubbing MUSCULOSKELETAL:  No deformity  SKIN: Warm and dry NEUROLOGIC:  Alert and oriented x 3, non-focal PSYCHIATRIC:  Normal affect, good insight  ASSESSMENT:    1. Palpitations   2. Prediabetes   3. Morbid obesity ([place])   4. Hx of preeclampsia, prior pregnancy, currently pregnant   5. Sinus tachycardia    PLAN:     I would like to rule out a cardiovascular etiology of this palpitation, therefore at this time I would like to placed a zio patch for   7 days. In additon a transthoracic echocardiogram will be ordered to assess LV/RV function and any structural abnormalities.  Once these testing have been performed amd reviewed further reccomendations will be made. For now, I do reccomend that the patient goes to the nearest ED if  symptoms recur.  Once I review her monitor, recommendations will be made if needed for any rate control agents.  Of course we will have to consider that the patient is pregnant.  We will be able to use beta-blocker if needed for rate control.  She is with sinus tachycardia today 113 bpm which may be physiologic due to her current state.  I encouraged her to continue her aspirin 81 mg daily given her history of preeclampsia.  Prediabetes, diet modification.  Morbid obesity-lifestyle and exercise advised.  TSH will also be done today.  The patient is in agreement with the above plan. The patient left the office in stable condition.  The patient will follow up in 6 weeks or sooner if needed.   Medication Adjustments/Labs and Tests Ordered: Current medicines are reviewed at length with the patient today.  Concerns regarding medicines are outlined above.  Orders Placed This Encounter  Procedures  . TSH  . LONG TERM MONITOR (3-14 DAYS)  . EKG 12-Lead  . ECHOCARDIOGRAM COMPLETE   No orders of the defined types were placed in this encounter.   Patient Instructions  Medication Instructions:  Your physician recommends that you continue on your current medications as directed. Please refer to the Current Medication list given to you today.  *If you need a refill on your cardiac medications before your next appointment, please call your pharmacy*   Lab Work: Your physician recommends that you return for lab work in: TODAY TSH If you have labs (blood work) drawn today and your tests are completely normal, you will receive your results only by: Marland Kitchen MyChart Message (if you have MyChart) OR . A paper copy in the mail If you have any lab test that is abnormal or we need to change your treatment, we will call you to review the  results.   Testing/Procedures: Your physician has requested that you have an echocardiogram. Echocardiography is a painless test that uses sound waves to create images of your heart. It provides your doctor with information about the size and shape of your heart and how well your heart's  chambers and valves are working. This procedure takes approximately one hour. There are no restrictions for this procedure.  A zio monitor was ordered today. It will remain on for 7 days. You will then return monitor and event diary in provided box. It takes 1-2 weeks for report to be downloaded and returned to [place]. We will call you with the results. If monitor falls off or has orange flashing light, please call Zio for further instructions.      Follow-Up: At [facility], you and your health needs are our priority.  As part of our continuing mission to provide you with exceptional heart care, we have created designated Provider Care Teams.  These Care Teams include your primary Cardiologist (physician) and Advanced Practice Providers (APPs -  Physician Assistants and Nurse Practitioners) who all work together to provide you with the care you need, when you need it.  We recommend signing up for the patient portal called "MyChart".  Sign up information is provided on this After Visit Summary.  MyChart is used to connect with patients for Virtual Visits (Telemedicine).  Patients are able to view lab/test results, encounter notes, upcoming appointments, etc.  Non-urgent messages can be sent to your provider as well.   To learn more about what you can do with MyChart, go to [email or web address].    Your next appointment:   6 week(s)  The format for your next appointment:   In Person  Provider:   Berniece Salines, DO   Other Instructions      Adopting a Healthy Lifestyle.  Know what a healthy weight is for you (roughly BMI <25) and aim to maintain this   Aim for 7+ servings of fruits and vegetables  daily   65-80+ fluid ounces of water or unsweet tea for healthy kidneys   Limit to max 1 drink of alcohol per day; avoid smoking/tobacco   Limit animal fats in diet for cholesterol and heart health - choose grass fed whenever available   Avoid highly processed foods, and foods high in saturated/trans fats   Aim for low stress - take time to unwind and care for your mental health   Aim for 150 min of moderate intensity exercise weekly for heart health, and weights twice weekly for bone health   Aim for 7-9 hours of sleep daily   When it comes to diets, agreement about the perfect plan isnt easy to find, even among the experts. Experts at the [employer] developed an idea known as the Healthy Eating Plate. Just imagine a plate divided into logical, healthy portions.   The emphasis is on diet quality:   Load up on vegetables and fruits - one-half of your plate: Aim for color and variety, and remember that potatoes dont count.   Go for whole grains - one-quarter of your plate: Whole wheat, barley, wheat berries, quinoa, oats, brown rice, and foods made with them. If you want pasta, go with whole wheat pasta.   Protein power - one-quarter of your plate: Fish, chicken, beans, and nuts are all healthy, versatile protein sources. Limit red meat.   The diet, however, does go beyond the plate, offering a few other suggestions.   Use healthy plant oils, such as olive, canola, soy, corn, sunflower and peanut. Check the labels, and avoid partially hydrogenated oil, which have unhealthy trans fats.   If youre thirsty, drink water. Coffee and tea are good in moderation, but skip sugary drinks and limit milk and dairy  products to one or two daily servings.   The type of carbohydrate in the diet is more important than the amount. Some sources of carbohydrates, such as vegetables, fruits, whole grains, and beans-are healthier than others.   Finally, stay  active  Signed, Berniece Salines, DO  10/17/2019 9:44 AM    [place]

## 2019-10-17 NOTE — Progress Notes (Signed)
I have reviewed this chart and agree with the RN/CMA assessment and management.    K. Meryl Amandalee Lacap, M.D. Attending [facility] (Faculty Practice)   

## 2019-10-17 NOTE — Patient Instructions (Signed)
Medication Instructions:  Your physician recommends that you continue on your current medications as directed. Please refer to the Current Medication list given to you today.  *If you need a refill on your cardiac medications before your next appointment, please call your pharmacy*   Lab Work: Your physician recommends that you return for lab work in: TODAY TSH If you have labs (blood work) drawn today and your tests are completely normal, you will receive your results only by: Marland Kitchen MyChart Message (if you have MyChart) OR . A paper copy in the mail If you have any lab test that is abnormal or we need to change your treatment, we will call you to review the results.   Testing/Procedures: Your physician has requested that you have an echocardiogram. Echocardiography is a painless test that uses sound waves to create images of your heart. It provides your doctor with information about the size and shape of your heart and how well your heart's chambers and valves are working. This procedure takes approximately one hour. There are no restrictions for this procedure.  A zio monitor was ordered today. It will remain on for 7 days. You will then return monitor and event diary in provided box. It takes 1-2 weeks for report to be downloaded and returned to [place]. We will call you with the results. If monitor falls off or has orange flashing light, please call Zio for further instructions.      Follow-Up: At [facility], you and your health needs are our priority.  As part of our continuing mission to provide you with exceptional heart care, we have created designated Provider Care Teams.  These Care Teams include your primary Cardiologist (physician) and Advanced Practice Providers (APPs -  Physician Assistants and Nurse Practitioners) who all work together to provide you with the care you need, when you need it.  We recommend signing up for the patient portal called "MyChart".  Sign up information is  provided on this After Visit Summary.  MyChart is used to connect with patients for Virtual Visits (Telemedicine).  Patients are able to view lab/test results, encounter notes, upcoming appointments, etc.  Non-urgent messages can be sent to your provider as well.   To learn more about what you can do with MyChart, go to [email or web address].    Your next appointment:   6 week(s)  The format for your next appointment:   In Person  Provider:   Thomasene Ripple, DO   Other Instructions

## 2019-10-18 ENCOUNTER — Telehealth: Payer: Self-pay

## 2019-10-18 NOTE — Telephone Encounter (Signed)
-----   Message from Thomasene Ripple, DO sent at 10/18/2019  8:34 AM EDT ----- TSH normal

## 2019-10-18 NOTE — Telephone Encounter (Signed)
Spoke with patient regarding results and recommendation.  Patient verbalizes understanding and is agreeable to plan of care. Advised patient to call back with any issues or concerns.  

## 2019-10-23 ENCOUNTER — Encounter: Payer: Self-pay | Admitting: General Practice

## 2019-10-24 ENCOUNTER — Encounter: Payer: Self-pay | Admitting: *Deleted

## 2019-10-24 ENCOUNTER — Other Ambulatory Visit: Payer: Self-pay

## 2019-10-24 ENCOUNTER — Ambulatory Visit: Payer: Medicaid Other | Attending: Obstetrics and Gynecology

## 2019-10-24 ENCOUNTER — Ambulatory Visit: Payer: Medicaid Other | Admitting: *Deleted

## 2019-10-24 DIAGNOSIS — O09292 Supervision of pregnancy with other poor reproductive or obstetric history, second trimester: Secondary | ICD-10-CM

## 2019-10-24 DIAGNOSIS — O24419 Gestational diabetes mellitus in pregnancy, unspecified control: Secondary | ICD-10-CM

## 2019-10-24 DIAGNOSIS — O99212 Obesity complicating pregnancy, second trimester: Secondary | ICD-10-CM

## 2019-10-24 DIAGNOSIS — Z362 Encounter for other antenatal screening follow-up: Secondary | ICD-10-CM

## 2019-10-24 DIAGNOSIS — O0990 Supervision of high risk pregnancy, unspecified, unspecified trimester: Secondary | ICD-10-CM

## 2019-10-24 DIAGNOSIS — O34219 Maternal care for unspecified type scar from previous cesarean delivery: Secondary | ICD-10-CM

## 2019-10-24 DIAGNOSIS — Z8759 Personal history of other complications of pregnancy, childbirth and the puerperium: Secondary | ICD-10-CM | POA: Diagnosis present

## 2019-10-24 DIAGNOSIS — Z3A27 27 weeks gestation of pregnancy: Secondary | ICD-10-CM

## 2019-10-25 ENCOUNTER — Other Ambulatory Visit: Payer: Self-pay | Admitting: *Deleted

## 2019-10-25 DIAGNOSIS — O99210 Obesity complicating pregnancy, unspecified trimester: Secondary | ICD-10-CM

## 2019-10-26 ENCOUNTER — Ambulatory Visit: Payer: Medicaid Other | Admitting: Cardiology

## 2019-10-30 ENCOUNTER — Telehealth (INDEPENDENT_AMBULATORY_CARE_PROVIDER_SITE_OTHER): Payer: Medicaid Other | Admitting: General Practice

## 2019-10-30 DIAGNOSIS — R030 Elevated blood-pressure reading, without diagnosis of hypertension: Secondary | ICD-10-CM

## 2019-10-30 DIAGNOSIS — O99891 Other specified diseases and conditions complicating pregnancy: Secondary | ICD-10-CM

## 2019-10-30 NOTE — Telephone Encounter (Signed)
Received alert from [employer] due to elevated BP of 140/80. Per chart review, questionable GHTN versus CHTN & patient has had some elevated values in the past. Called patient to verify she is taking her BP correctly and her cuff has been verified for accuracy in the past. Patient confirms. Discussed coming in for physical visit tomorrow instead of mychart and/or coming in for pre-e labs per Nolene Bernheim. Patient states she doesn't know if she can come in or not but will let us know during virtual visit.

## 2019-10-31 ENCOUNTER — Encounter: Payer: Self-pay | Admitting: Medical

## 2019-10-31 ENCOUNTER — Telehealth (INDEPENDENT_AMBULATORY_CARE_PROVIDER_SITE_OTHER): Payer: Medicaid Other | Admitting: Medical

## 2019-10-31 ENCOUNTER — Other Ambulatory Visit (HOSPITAL_COMMUNITY)
Admission: RE | Admit: 2019-10-31 | Discharge: 2019-10-31 | Disposition: A | Discharge status: Home / Self Care | Payer: Medicaid Other | Source: Ambulatory Visit | Attending: Medical | Admitting: Medical

## 2019-10-31 ENCOUNTER — Other Ambulatory Visit: Payer: Self-pay

## 2019-10-31 VITALS — BP 111/78 | HR 97 | Wt 323.9 lb

## 2019-10-31 DIAGNOSIS — O0990 Supervision of high risk pregnancy, unspecified, unspecified trimester: Secondary | ICD-10-CM | POA: Diagnosis present

## 2019-10-31 DIAGNOSIS — O0993 Supervision of high risk pregnancy, unspecified, third trimester: Secondary | ICD-10-CM

## 2019-10-31 DIAGNOSIS — Z8759 Personal history of other complications of pregnancy, childbirth and the puerperium: Secondary | ICD-10-CM

## 2019-10-31 DIAGNOSIS — Z98891 History of uterine scar from previous surgery: Secondary | ICD-10-CM

## 2019-10-31 DIAGNOSIS — Z23 Encounter for immunization: Secondary | ICD-10-CM | POA: Diagnosis not present

## 2019-10-31 DIAGNOSIS — B373 Candidiasis of vulva and vagina: Secondary | ICD-10-CM

## 2019-10-31 DIAGNOSIS — Z3A28 28 weeks gestation of pregnancy: Secondary | ICD-10-CM

## 2019-10-31 DIAGNOSIS — O34219 Maternal care for unspecified type scar from previous cesarean delivery: Secondary | ICD-10-CM

## 2019-10-31 DIAGNOSIS — N898 Other specified noninflammatory disorders of vagina: Secondary | ICD-10-CM

## 2019-10-31 DIAGNOSIS — O99210 Obesity complicating pregnancy, unspecified trimester: Secondary | ICD-10-CM

## 2019-10-31 DIAGNOSIS — O26893 Other specified pregnancy related conditions, third trimester: Secondary | ICD-10-CM

## 2019-10-31 DIAGNOSIS — Z3009 Encounter for other general counseling and advice on contraception: Secondary | ICD-10-CM

## 2019-10-31 DIAGNOSIS — O99213 Obesity complicating pregnancy, third trimester: Secondary | ICD-10-CM

## 2019-10-31 DIAGNOSIS — R102 Pelvic and perineal pain: Secondary | ICD-10-CM

## 2019-10-31 DIAGNOSIS — R7303 Prediabetes: Secondary | ICD-10-CM

## 2019-10-31 DIAGNOSIS — E669 Obesity, unspecified: Secondary | ICD-10-CM

## 2019-10-31 DIAGNOSIS — O98813 Other maternal infectious and parasitic diseases complicating pregnancy, third trimester: Secondary | ICD-10-CM

## 2019-10-31 DIAGNOSIS — N949 Unspecified condition associated with female genital organs and menstrual cycle: Secondary | ICD-10-CM

## 2019-10-31 LAB — POCT URINALYSIS DIP (DEVICE)
Bilirubin Urine: NEGATIVE
Glucose, UA: NEGATIVE mg/dL
Hgb urine dipstick: NEGATIVE
Ketones, ur: 40 mg/dL — AB
Nitrite: NEGATIVE
Protein, ur: NEGATIVE mg/dL
Specific Gravity, Urine: 1.025 (ref 1.005–1.030)
Urobilinogen, UA: 0.2 mg/dL (ref 0.0–1.0)
pH: 6 (ref 5.0–8.0)

## 2019-10-31 MED ORDER — COMFORT FIT MATERNITY SUPP LG MISC: 1.0000 [IU] | Freq: Every day | 0 refills | Status: DC | PRN

## 2019-10-31 NOTE — Patient Instructions (Addendum)
Fetal Movement Counts Patient Name: ________________________________________________ Patient Due Date: ____________________ What is a fetal movement count?  A fetal movement count is the number of times that you feel your baby move during a certain amount of time. This may also be called a fetal kick count. A fetal movement count is recommended for every pregnant woman. You may be asked to start counting fetal movements as early as week 28 of your pregnancy. Pay attention to when your baby is most active. You may notice your baby's sleep and wake cycles. You may also notice things that make your baby move more. You should do a fetal movement count:  When your baby is normally most active.  At the same time each day. A good time to count movements is while you are resting, after having something to eat and drink. How do I count fetal movements? 1. Find a quiet, comfortable area. Sit, or lie down on your side. 2. Write down the date, the start time and stop time, and the number of movements that you felt between those two times. Take this information with you to your health care visits. 3. Write down your start time when you feel the first movement. 4. Count kicks, flutters, swishes, rolls, and jabs. You should feel at least 10 movements. 5. You may stop counting after you have felt 10 movements, or if you have been counting for 2 hours. Write down the stop time. 6. If you do not feel 10 movements in 2 hours, contact your health care provider for further instructions. Your health care provider may want to do additional tests to assess your baby's well-being. Contact a health care provider if:  You feel fewer than 10 movements in 2 hours.  Your baby is not moving like he or she usually does. Date: ____________ Start time: ____________ Stop time: ____________ Movements: ____________ Date: ____________ Start time: ____________ Stop time: ____________ Movements: ____________ Date: ____________  Start time: ____________ Stop time: ____________ Movements: ____________ Date: ____________ Start time: ____________ Stop time: ____________ Movements: ____________ Date: ____________ Start time: ____________ Stop time: ____________ Movements: ____________ Date: ____________ Start time: ____________ Stop time: ____________ Movements: ____________ Date: ____________ Start time: ____________ Stop time: ____________ Movements: ____________ Date: ____________ Start time: ____________ Stop time: ____________ Movements: ____________ Date: ____________ Start time: ____________ Stop time: ____________ Movements: ____________ This information is not intended to replace advice given to you by your health care provider. Make sure you discuss any questions you have with your health care provider. Document Revised: 11/17/2018 Document Reviewed: 11/17/2018 Elsevier Patient Education  2020 [employer] Braxton Hicks Contractions Contractions of the uterus can occur throughout pregnancy, but they are not always a sign that you are in labor. You may have practice contractions called Braxton Hicks contractions. These false labor contractions are sometimes confused with true labor. What are Braxton Hicks contractions? Braxton Hicks contractions are tightening movements that occur in the muscles of the uterus before labor. Unlike true labor contractions, these contractions do not result in opening (dilation) and thinning of the cervix. Toward the end of pregnancy (32-34 weeks), Braxton Hicks contractions can happen more often and may become stronger. These contractions are sometimes difficult to tell apart from true labor because they can be very uncomfortable. You should not feel embarrassed if you go to the hospital with false labor. Sometimes, the only way to tell if you are in true labor is for your health care provider to look for changes in the cervix. The health care provider   will do a physical exam and may  monitor your contractions. If you are not in true labor, the exam should show that your cervix is not dilating and your water has not broken. °If there are no other health problems associated with your pregnancy, it is completely safe for you to be sent home with false labor. You may continue to have Braxton Hicks contractions until you go into true labor. °How to tell the difference between true labor and false labor °True labor °· Contractions last 30-70 seconds. °· Contractions become very regular. °· Discomfort is usually felt in the top of the uterus, and it spreads to the lower abdomen and low back. °· Contractions do not go away with walking. °· Contractions usually become more intense and increase in frequency. °· The cervix dilates and gets thinner. °False labor °· Contractions are usually shorter and not as strong as true labor contractions. °· Contractions are usually irregular. °· Contractions are often felt in the front of the lower abdomen and in the groin. °· Contractions may go away when you walk around or change positions while lying down. °· Contractions get weaker and are shorter-lasting as time goes on. °· The cervix usually does not dilate or become thin. °Follow these instructions at home: ° °· Take over-the-counter and prescription medicines only as told by your health care provider. °· Keep up with your usual exercises and follow other instructions from your health care provider. °· Eat and drink lightly if you think you are going into labor. °· If Braxton Hicks contractions are making you uncomfortable: °? Change your position from lying down or resting to walking, or change from walking to resting. °? Sit and rest in a tub of warm water. °? Drink enough fluid to keep your urine pale yellow. Dehydration may cause these contractions. °? Do slow and deep breathing several times an hour. °· Keep all follow-up prenatal visits as told by your health care provider. This is important. °Contact a  health care provider if: °· You have a fever. °· You have continuous pain in your abdomen. °Get help right away if: °· Your contractions become stronger, more regular, and closer together. °· You have fluid leaking or gushing from your vagina. °· You pass blood-tinged mucus (bloody show). °· You have bleeding from your vagina. °· You have low back pain that you never had before. °· You feel your baby’s head pushing down and causing pelvic pressure. °· Your baby is not moving inside you as much as it used to. °Summary °· Contractions that occur before labor are called Braxton Hicks contractions, false labor, or practice contractions. °· Braxton Hicks contractions are usually shorter, weaker, farther apart, and less regular than true labor contractions. True labor contractions usually become progressively stronger and regular, and they become more frequent. °· Manage discomfort from Braxton Hicks contractions by changing position, resting in a warm bath, drinking plenty of water, or practicing deep breathing. °This information is not intended to replace advice given to you by your health care provider. Make sure you discuss any questions you have with your health care provider. °Document Revised: 03/12/2017 Document Reviewed: 08/13/2016 °Elsevier Patient Education © 2020 Elsevier Inc. ° °For belly band:  °[facility]  ° °[address], [place], [place] [address] °Phone: [phone] ° °Monday     8:30AM-5PM °Tuesday 8:30AM-5PM °Wednesday 8:30AM-5PM °Thursday 8:30AM-5PM °Friday  8:30AM-5PM °Saturday Closed °Sunday Closed ° ° °

## 2019-10-31 NOTE — Progress Notes (Signed)
Pt reports burning with urination and vaginal itching. Reports having green, thick vaginal discharge. Pt had multiple elevated BP readings at home. Cuff brought to visit today. Personal cuff reads 20 points higher systolic than cuff in office. Pt advised to return to [facility].  Fleet Contras RN 10/31/19

## 2019-10-31 NOTE — Progress Notes (Signed)
   PRENATAL VISIT NOTE  Subjective:  Veronica Boyle is a 29 y.o. G3P2002 at [redacted]w[redacted]d being seen today for ongoing prenatal care.  She is currently monitored for the following issues for this high-risk pregnancy and has History of fetal abnormality in previous pregnancy, currently pregnant; Previous cesarean delivery affecting pregnancy, antepartum; Paroxysmal supraventricular tachycardia (HCC); Palpitations; Anxiety; Psychogenic syncope; Supervision of high risk pregnancy, antepartum; History of gestational hypertension; BMI 40.0-44.9, adult (HCC); Obesity in pregnancy; Sciatic leg pain; Low vitamin D level; Unwanted fertility; Tachycardia; Shortness of breath; Numbness and tingling in both hands; and Prediabetes on their problem list.  Patient reports pelvic pressure at night and occasional cramping.  Contractions: Irritability. Vag. Bleeding: None.  Movement: Present. Denies leaking of fluid.   The following portions of the patient's history were reviewed and updated as appropriate: allergies, current medications, past family history, past medical history, past social history, past surgical history and problem list.   Objective:   Vitals:   10/31/19 0910  BP: 111/78  Pulse: 97  Weight: (!) 323 lb 14.4 oz (146.9 kg)    Fetal Status: Fetal Heart Rate (bpm): 144   Movement: Present     General:  Alert, oriented and cooperative. Patient is in no acute distress.  Skin: Skin is warm and dry. No rash noted.   Cardiovascular: Normal heart rate noted  Respiratory: Normal respiratory effort, no problems with respiration noted  Abdomen: Soft, gravid, appropriate for gestational age.  Pain/Pressure: Absent     Pelvic: Cervical exam deferred        Extremities: Normal range of motion.  Edema: Trace  Mental Status: Normal mood and affect. Normal behavior. Normal judgment and thought content.   Assessment and Plan:  Pregnancy: G3P2002 at [redacted]w[redacted]d 1. Supervision of high risk pregnancy, antepartum -  Patient reported mildly elevated BP on BRx yesterday, baseline labs today, although normotensive in office today  - Protein / creatinine ratio, urine - Comprehensive metabolic panel - Tdap vaccine greater than or equal to 7yo IM - CBC  2. History of gestational hypertension - Normotensive today   3. Prediabetes - Normal GTT at last visit   4. Unwanted fertility - BTL consent signed previously   5. Obesity in pregnancy - TWG 14#  - Serial growth [place] scheduled, next on 8/10 - Last [place] EFW 42%  6. Previous cesarean delivery affecting pregnancy, antepartum - Planning repeat at 39 weeks, message sent to schedule   7. Pelvic pressure in pregnancy, antepartum, third trimester - Elastic Bandages & Supports (COMFORT FIT MATERNITY SUPP LG) MISC; 1 Units by Does not apply route daily as needed.  Dispense: 1 each; Refill: 0  8. Vaginal discharge during pregnancy, antepartum, third trimester - Cervicovaginal ancillary only( [facility])   Preterm labor symptoms and general obstetric precautions including but not limited to vaginal bleeding, contractions, leaking of fluid and fetal movement were reviewed in detail with the patient. Please refer to After Visit Summary for other counseling recommendations.   Return in about 2 weeks (around 11/14/2019) for [facility] APP, In-Person.  Future Appointments  Date Time Provider Department Center  11/02/2019  8:15 AM [facility] CV [place] 1 [facility] None  11/14/2019 10:15 AM Warden Fillers, MD [facility]  11/21/2019  3:00 PM [facility] NURSE [facility]  11/21/2019  3:15 PM [facility] US2 [facility]  11/30/2019  8:00 AM Tobb, Lavona Mound, DO [facility] None    Vonzella Nipple, PA-C

## 2019-11-01 LAB — CERVICOVAGINAL ANCILLARY ONLY
Bacterial Vaginitis (gardnerella): NEGATIVE
Candida Glabrata: NEGATIVE
Candida Vaginitis: POSITIVE — AB
Chlamydia: NEGATIVE
Comment: NEGATIVE
Comment: NEGATIVE
Comment: NEGATIVE
Comment: NEGATIVE
Comment: NEGATIVE
Comment: NORMAL
Neisseria Gonorrhea: NEGATIVE
Trichomonas: NEGATIVE

## 2019-11-01 LAB — CBC
Hematocrit: 34.1 % (ref 34.0–46.6)
Hemoglobin: 11.5 g/dL (ref 11.1–15.9)
MCH: 30.7 pg (ref 26.6–33.0)
MCHC: 33.7 g/dL (ref 31.5–35.7)
MCV: 91 fL (ref 79–97)
Platelets: 237 10*3/uL (ref 150–450)
RBC: 3.74 x10E6/uL — ABNORMAL LOW (ref 3.77–5.28)
RDW: 12.6 % (ref 11.7–15.4)
WBC: 9.7 10*3/uL (ref 3.4–10.8)

## 2019-11-01 LAB — COMPREHENSIVE METABOLIC PANEL
ALT: 16 IU/L (ref 0–32)
AST: 21 IU/L (ref 0–40)
Albumin/Globulin Ratio: 1.2 (ref 1.2–2.2)
Albumin: 3.5 g/dL — ABNORMAL LOW (ref 3.9–5.0)
Alkaline Phosphatase: 65 IU/L (ref 48–121)
BUN/Creatinine Ratio: 13 (ref 9–23)
BUN: 6 mg/dL (ref 6–20)
Bilirubin Total: 0.3 mg/dL (ref 0.0–1.2)
CO2: 19 mmol/L — ABNORMAL LOW (ref 20–29)
Calcium: 9 mg/dL (ref 8.7–10.2)
Chloride: 101 mmol/L (ref 96–106)
Creatinine, Ser: 0.45 mg/dL — ABNORMAL LOW (ref 0.57–1.00)
GFR calc Af Amer: 159 mL/min/{1.73_m2} (ref >59)
GFR calc non Af Amer: 138 mL/min/{1.73_m2} (ref >59)
Globulin, Total: 2.9 g/dL (ref 1.5–4.5)
Glucose: 81 mg/dL (ref 65–99)
Potassium: 4.1 mmol/L (ref 3.5–5.2)
Sodium: 135 mmol/L (ref 134–144)
Total Protein: 6.4 g/dL (ref 6.0–8.5)

## 2019-11-01 LAB — PROTEIN / CREATININE RATIO, URINE
Creatinine, Urine: 161.6 mg/dL
Protein, Ur: 37.6 mg/dL
Protein/Creat Ratio: 233 mg/g creat — ABNORMAL HIGH (ref 0–200)

## 2019-11-02 ENCOUNTER — Ambulatory Visit (INDEPENDENT_AMBULATORY_CARE_PROVIDER_SITE_OTHER): Payer: Medicaid Other

## 2019-11-02 ENCOUNTER — Other Ambulatory Visit: Payer: Self-pay

## 2019-11-02 ENCOUNTER — Other Ambulatory Visit: Payer: Self-pay | Admitting: Lactation Services

## 2019-11-02 DIAGNOSIS — R002 Palpitations: Secondary | ICD-10-CM | POA: Diagnosis not present

## 2019-11-02 LAB — ECHOCARDIOGRAM COMPLETE
Area-P 1/2: 5.6 cm2
S' Lateral: 2.4 cm

## 2019-11-02 MED ORDER — TERCONAZOLE 0.4 % VA CREA
1.0000 | TOPICAL_CREAM | Freq: Every day | VAGINAL | 0 refills | Status: DC
Start: 2019-11-02 — End: 2019-11-14

## 2019-11-02 NOTE — Progress Notes (Signed)
Complete echocardiogram performed.  Jimmy Aryianna Earwood RDCS, RVT  

## 2019-11-03 ENCOUNTER — Telehealth: Payer: Self-pay

## 2019-11-03 NOTE — Telephone Encounter (Signed)
Spoke with patient regarding results and recommendation.  Patient verbalizes understanding and is agreeable to plan of care. Advised patient to call back with any issues or concerns.  

## 2019-11-03 NOTE — Telephone Encounter (Signed)
-----   Message from Thomasene Ripple, DO sent at 11/03/2019 10:16 AM EDT ----- Echo normal

## 2019-11-03 NOTE — Telephone Encounter (Signed)
Patient returning call.

## 2019-11-03 NOTE — Telephone Encounter (Signed)
-----   Message from Kardie Tobb, DO sent at 11/03/2019 10:16 AM EDT ----- Echo normal 

## 2019-11-03 NOTE — Telephone Encounter (Signed)
Tried calling patient. No answer and no voicemail set up for me to leave a message. 

## 2019-11-14 ENCOUNTER — Other Ambulatory Visit: Payer: Self-pay

## 2019-11-14 ENCOUNTER — Telehealth: Payer: Self-pay

## 2019-11-14 ENCOUNTER — Ambulatory Visit (INDEPENDENT_AMBULATORY_CARE_PROVIDER_SITE_OTHER): Payer: Medicaid Other | Admitting: Student

## 2019-11-14 DIAGNOSIS — O0990 Supervision of high risk pregnancy, unspecified, unspecified trimester: Secondary | ICD-10-CM

## 2019-11-14 NOTE — Progress Notes (Signed)
   PRENATAL VISIT NOTE  Subjective:  Veronica Boyle is a 29 y.o. G3P2002 at [redacted]w[redacted]d being seen today for ongoing prenatal care.  She is currently monitored for the following issues for this high-risk pregnancy and has History of fetal abnormality in previous pregnancy, currently pregnant; Previous cesarean delivery affecting pregnancy, antepartum; Paroxysmal supraventricular tachycardia (HCC); Palpitations; Anxiety; Psychogenic syncope; Supervision of high risk pregnancy, antepartum; History of gestational hypertension; BMI 40.0-44.9, adult (HCC); Obesity in pregnancy; Sciatic leg pain; Low vitamin D level; Unwanted fertility; Tachycardia; Shortness of breath; Numbness and tingling in both hands; and Prediabetes on their problem list.  Patient reports no complaints. She is planning to get her cuff exchanged next week. She reports that she has had elevated blood pressures and headache that have resolved. Per RN, she has been told multiple times to get her cuff recalibrated and exchanged.  Contractions: Not present. Vag. Bleeding: None.  Movement: Present. Denies leaking of fluid.   The following portions of the patient's history were reviewed and updated as appropriate: allergies, current medications, past family history, past medical history, past social history, past surgical history and problem list.   Objective:   Vitals:   11/14/19 1048  BP: 112/63  Pulse: (!) 113  Weight: (!) 322 lb 9.6 oz (146.3 kg)    Fetal Status: Fetal Heart Rate (bpm): 140   Movement: Present     General:  Alert, oriented and cooperative. Patient is in no acute distress.  Skin: Skin is warm and dry. No rash noted.   Cardiovascular: Normal heart rate noted  Respiratory: Normal respiratory effort, no problems with respiration noted  Abdomen: Soft, gravid, appropriate for gestational age.  Pain/Pressure: Present     Pelvic: Cervical exam deferred        Extremities: Normal range of motion.     Mental Status: Normal  mood and affect. Normal behavior. Normal judgment and thought content.   Assessment and Plan:  Pregnancy: G3P2002 at [redacted]w[redacted]d 1. Supervision of high risk pregnancy, antepartum -keep appt next week for growth scan -patient will change BP cuff -go to [facility] if any more elevated pressures, reviewed warning signs of pre-e.   Preterm labor symptoms and general obstetric precautions including but not limited to vaginal bleeding, contractions, leaking of fluid and fetal movement were reviewed in detail with the patient. Please refer to After Visit Summary for other counseling recommendations.   Return in about 2 weeks (around 11/28/2019), or HROB on My Chart with MD.  Future Appointments  Date Time Provider Department Center  11/21/2019  3:00 PM [facility] NURSE [facility]  11/21/2019  3:15 PM [facility] US2 [facility]  11/30/2019  8:00 AM Tobb, Lavona Mound, DO [facility] None    Marylene Land, [place]

## 2019-11-14 NOTE — Telephone Encounter (Signed)
Spoke with patient regarding results and recommendation.  Patient verbalizes understanding and is agreeable to plan of care. Advised patient to call back with any issues or concerns.  

## 2019-11-14 NOTE — Telephone Encounter (Signed)
-----   Message from Thomasene Ripple, DO sent at 11/14/2019  8:19 AM EDT ----- Normal study

## 2019-11-14 NOTE — Telephone Encounter (Signed)
-----   Message from Kardie Tobb, DO sent at 11/14/2019  8:19 AM EDT ----- Normal study 

## 2019-11-14 NOTE — Telephone Encounter (Signed)
Tried calling patient. No answer and no voicemail set up for me to leave a message. 

## 2019-11-16 ENCOUNTER — Encounter: Payer: Self-pay | Admitting: *Deleted

## 2019-11-21 ENCOUNTER — Other Ambulatory Visit: Payer: Self-pay | Admitting: *Deleted

## 2019-11-21 ENCOUNTER — Ambulatory Visit: Payer: Medicaid Other | Admitting: *Deleted

## 2019-11-21 ENCOUNTER — Ambulatory Visit: Payer: Medicaid Other | Attending: Obstetrics and Gynecology

## 2019-11-21 ENCOUNTER — Encounter: Payer: Self-pay | Admitting: *Deleted

## 2019-11-21 ENCOUNTER — Other Ambulatory Visit: Payer: Self-pay

## 2019-11-21 DIAGNOSIS — O34219 Maternal care for unspecified type scar from previous cesarean delivery: Secondary | ICD-10-CM | POA: Diagnosis not present

## 2019-11-21 DIAGNOSIS — Z3A31 31 weeks gestation of pregnancy: Secondary | ICD-10-CM

## 2019-11-21 DIAGNOSIS — O0990 Supervision of high risk pregnancy, unspecified, unspecified trimester: Secondary | ICD-10-CM | POA: Insufficient documentation

## 2019-11-21 DIAGNOSIS — O09293 Supervision of pregnancy with other poor reproductive or obstetric history, third trimester: Secondary | ICD-10-CM

## 2019-11-21 DIAGNOSIS — O99210 Obesity complicating pregnancy, unspecified trimester: Secondary | ICD-10-CM | POA: Diagnosis not present

## 2019-11-21 DIAGNOSIS — O99213 Obesity complicating pregnancy, third trimester: Secondary | ICD-10-CM

## 2019-11-21 DIAGNOSIS — O24313 Unspecified pre-existing diabetes mellitus in pregnancy, third trimester: Secondary | ICD-10-CM

## 2019-11-21 DIAGNOSIS — Z362 Encounter for other antenatal screening follow-up: Secondary | ICD-10-CM | POA: Diagnosis not present

## 2019-11-21 DIAGNOSIS — Z8759 Personal history of other complications of pregnancy, childbirth and the puerperium: Secondary | ICD-10-CM

## 2019-11-21 DIAGNOSIS — Z6841 Body Mass Index (BMI) 40.0 and over, adult: Secondary | ICD-10-CM

## 2019-11-21 NOTE — Progress Notes (Signed)
States she has had HA's that she has to sleep off. States she has visual changes with the HA's. C/O RUQ the other day but not today. Also has increase in reflux lately.

## 2019-11-27 ENCOUNTER — Telehealth: Payer: Self-pay

## 2019-11-27 NOTE — Telephone Encounter (Signed)
Elevated BP alert received from baby scripts of 127/92. No symptoms reported by patient. Called pt; VM has not been set up. MyChart message sent for follow up. Pt will be seen via MyChart 11/29/19.

## 2019-11-28 NOTE — Telephone Encounter (Signed)
BP values recorded in [employer]ys[employer]s over past month:

## 2019-11-29 ENCOUNTER — Telehealth (INDEPENDENT_AMBULATORY_CARE_PROVIDER_SITE_OTHER): Payer: Medicaid Other | Admitting: Family Medicine

## 2019-11-29 DIAGNOSIS — Z8759 Personal history of other complications of pregnancy, childbirth and the puerperium: Secondary | ICD-10-CM

## 2019-11-29 DIAGNOSIS — Z6841 Body Mass Index (BMI) 40.0 and over, adult: Secondary | ICD-10-CM

## 2019-11-29 DIAGNOSIS — Z3A32 32 weeks gestation of pregnancy: Secondary | ICD-10-CM

## 2019-11-29 DIAGNOSIS — E669 Obesity, unspecified: Secondary | ICD-10-CM

## 2019-11-29 DIAGNOSIS — O99213 Obesity complicating pregnancy, third trimester: Secondary | ICD-10-CM

## 2019-11-29 DIAGNOSIS — O0990 Supervision of high risk pregnancy, unspecified, unspecified trimester: Secondary | ICD-10-CM

## 2019-11-29 DIAGNOSIS — O34219 Maternal care for unspecified type scar from previous cesarean delivery: Secondary | ICD-10-CM

## 2019-11-29 DIAGNOSIS — O09293 Supervision of pregnancy with other poor reproductive or obstetric history, third trimester: Secondary | ICD-10-CM

## 2019-11-29 NOTE — Patient Instructions (Signed)

## 2019-11-29 NOTE — Progress Notes (Signed)
Pt does not have access to cuff right now but will take BP later & record. Pt states when she took BP last night it was 135/104, was concerned of readings. Pt currently only taking Asprin 81mg .

## 2019-11-29 NOTE — Progress Notes (Signed)
OBSTETRICS PRENATAL VIRTUAL VISIT ENCOUNTER NOTE  Provider location: [facility]   I connected with Veronica Boyle on 11/29/19 at 10:35 AM EDT by MyChart Video Encounter at home and verified that I am speaking with the correct person using two identifiers.   I discussed the limitations, risks, security and privacy concerns of performing an evaluation and management service virtually and the availability of in person appointments. I also discussed with the patient that there may be a patient responsible charge related to this service. The patient expressed understanding and agreed to proceed. Subjective:  Veronica Boyle is a 29 y.o. G3P2002 at [redacted]w[redacted]d being seen today for ongoing prenatal care.  She is currently monitored for the following issues for this high-risk pregnancy and has History of fetal abnormality in previous pregnancy, currently pregnant; Previous cesarean delivery affecting pregnancy, antepartum; Paroxysmal supraventricular tachycardia (HCC); Palpitations; Anxiety; Psychogenic syncope; Supervision of high risk pregnancy, antepartum; History of gestational hypertension; BMI 40.0-44.9, adult (HCC); Obesity in pregnancy; Sciatic leg pain; Low vitamin D level; Unwanted fertility; Tachycardia; Shortness of breath; Numbness and tingling in both hands; and Prediabetes on their problem list.  Patient reports headache.  Contractions: Not present. Vag. Bleeding: None.  Movement: Present. Denies any leaking of fluid.   The following portions of the patient's history were reviewed and updated as appropriate: allergies, current medications, past family history, past medical history, past social history, past surgical history and problem list.   Objective:  There were no vitals filed for this visit.  Fetal Status:     Movement: Present     General:  Alert, oriented and cooperative. Patient is in no acute distress.  Respiratory: Normal respiratory  effort, no problems with respiration noted  Mental Status: Normal mood and affect. Normal behavior. Normal judgment and thought content.  Rest of physical exam deferred due to type of encounter  Imaging: ECHOCARDIOGRAM COMPLETE  Result Date: 11/02/2019    ECHOCARDIOGRAM REPORT   Patient Name:   Veronica Boyle Date of Exam: 11/02/2019 Medical Rec #:  [id number]      Height:       65.0 in Accession #:    [id number]     Weight:       323.9 lb Date of Birth:  09/21/1990      BSA:          2.428 m Patient Age:    29 years       BP:           133/78 mmHg Patient Gender: F              HR:           87 bpm. Exam Location:  [facility] Procedure: 2D Echo Indications:    Palpitations [R00.2 (ICD-10-CM)]  History:        Patient has no prior history of Echocardiogram examinations.                 Arrythmias:Paroxysmal supraventricular tachycardia,                 Signs/Symptoms:Obesity in pregnancy, BMI 40.0-44.9, adult and                 Shortness of Breath; Risk Factors:Prediabetes.  Sonographer:    Louie Boston Referring Phys: [id number] KARDIE TOBB IMPRESSIONS  1. Left ventricular ejection fraction, by estimation, is 60 to 65%. The left ventricle has normal function. The left ventricle has no regional wall  motion abnormalities. Left ventricular diastolic parameters were normal.  2. Right ventricular systolic function is normal. The right ventricular size is normal. There is normal pulmonary artery systolic pressure.  3. The mitral valve is normal in structure. No evidence of mitral valve regurgitation. No evidence of mitral stenosis.  4. The aortic valve is normal in structure. Aortic valve regurgitation is not visualized. No aortic stenosis is present.  5. The inferior vena cava is normal in size with greater than 50% respiratory variability, suggesting right atrial pressure of 3 mmHg.  6. Possibly small PFO with Lt to Rt flow. Seen only in subcostal views. FINDINGS  Left Ventricle: Left ventricular ejection fraction, by  estimation, is 60 to 65%. The left ventricle has normal function. The left ventricle has no regional wall motion abnormalities. The left ventricular internal cavity size was normal in size. There is  no left ventricular hypertrophy. Left ventricular diastolic parameters were normal. Right Ventricle: The right ventricular size is normal. No increase in right ventricular wall thickness. Right ventricular systolic function is normal. There is normal pulmonary artery systolic pressure. The tricuspid regurgitant velocity is 1.49 m/s, and  with an assumed right atrial pressure of 3 mmHg, the estimated right ventricular systolic pressure is 11.9 mmHg. Left Atrium: Left atrial size was normal in size. Right Atrium: Right atrial size was normal in size. Pericardium: There is no evidence of pericardial effusion. Mitral Valve: The mitral valve is normal in structure. Normal mobility of the mitral valve leaflets. No evidence of mitral valve regurgitation. No evidence of mitral valve stenosis. Tricuspid Valve: The tricuspid valve is normal in structure. Tricuspid valve regurgitation is not demonstrated. No evidence of tricuspid stenosis. Aortic Valve: The aortic valve is normal in structure. Aortic valve regurgitation is not visualized. No aortic stenosis is present. Pulmonic Valve: The pulmonic valve was normal in structure. Pulmonic valve regurgitation is not visualized. No evidence of pulmonic stenosis. Aorta: The aortic root is normal in size and structure. Venous: The inferior vena cava is normal in size with greater than 50% respiratory variability, suggesting right atrial pressure of 3 mmHg. IAS/Shunts: No atrial level shunt detected by color flow Doppler.  LEFT VENTRICLE PLAX 2D LVIDd:         3.80 cm  Diastology LVIDs:         2.40 cm  LV e' lateral:   12.10 cm/s LV PW:         1.00 cm  LV E/e' lateral: 6.5 LV IVS:        0.90 cm  LV e' medial:    8.70 cm/s LVOT diam:     2.00 cm  LV E/e' medial:  9.0 LV SV:         58  LV SV Index:   24 LVOT Area:     3.14 cm  RIGHT VENTRICLE             IVC RV S prime:     14.60 cm/s  IVC diam: 1.50 cm TAPSE (M-mode): 2.3 cm LEFT ATRIUM             Index       RIGHT ATRIUM           Index LA diam:        3.20 cm 1.32 cm/m  RA Area:     12.20 cm LA Vol (A2C):   54.8 ml 22.57 ml/m RA Volume:   26.60 ml  10.96 ml/m LA Vol (A4C):   46.5 ml  19.15 ml/m LA Biplane Vol: 50.4 ml 20.76 ml/m  AORTIC VALVE LVOT Vmax:   102.00 cm/s LVOT Vmean:  74.000 cm/s LVOT VTI:    0.185 m  AORTA Ao Root diam: 3.10 cm Ao Asc diam:  2.70 cm MITRAL VALVE               TRICUSPID VALVE MV Area (PHT): 5.60 cm    TR Peak grad:   8.9 mmHg MV Decel Time: 136 msec    TR Vmax:        149.00 cm/s MV E velocity: 78.60 cm/s MV A velocity: 46.60 cm/s  SHUNTS MV E/A ratio:  1.69        Systemic VTI:  0.18 m                            Systemic Diam: 2.00 cm Gypsy Balsam MD Electronically signed by Gypsy Balsam MD Signature Date/Time: 11/02/2019/12:32:04 PM    Final    LONG TERM MONITOR (3-14 DAYS)  Result Date: 11/13/2019 The patient wore the monitor for 7 days starting October 17, 2019. Indication: Palpitations The minimum heart rate was xx  bpm, maximum heart rate was xxx  bpm, and average heart rate was xx  bpm. Predominant underlying rhythm was Sinus Rhythm. Premature atrial complexes were rare less than 1%. Premature Ventricular complexes rare less than 1%. No ventricular tachycardia, no pauses, No AV block, no supraventricular tachycardia and no atrial fibrillation present. 2 patient triggered events into diary events all associated with sinus rhythm. Conclusion: Normal/unremarkable study.  [place] MFM OB FOLLOW UP  Result Date: 11/21/2019 ----------------------------------------------------------------------  OBSTETRICS REPORT                       (Signed Final 11/21/2019 04:59 pm) ---------------------------------------------------------------------- Patient Info  ID #:       [id number]                           D.O.B.:  02-Jul-1990 (29 yrs)  Name:       Veronica Boyle                  Visit Date: 11/21/2019 03:32 pm ---------------------------------------------------------------------- Performed By  Attending:        Noralee Space MD        Ref. Address:     [address]                                                             [place], [place]  [address]  Performed By:     Truitt Leep,      [facility]  Referred By:      Farwell Bing MD ---------------------------------------------------------------------- Orders  #  Description                           Code        Ordered By  1  [place] MFM OB FOLLOW UP                   41324.40    RAVI [facility] ----------------------------------------------------------------------  #  Order #                     Accession #                Episode #  1  [id number]                   [id number]                 [id number] ---------------------------------------------------------------------- Indications  Maternal morbid obesity (BMI 51)               O99.210 E66.01  Diabetes - Pregestational, 2nd trimester -     O24.312  normal FETAL ECHO  History of cesarean delivery, currently        O34.219  pregnant (x2)  Encounter for other antenatal screening        Z36.2  follow-up  Poor obstetric history: Previous               O09.299  preeclampsia / eclampsia/gestational HTN  Low risk NIPS(Normal AFP)  [redacted] weeks gestation of pregnancy                Z3A.31 ---------------------------------------------------------------------- Vital Signs                                                 Height:        5'5"  ---------------------------------------------------------------------- Fetal Evaluation  Num Of Fetuses:         1  Cardiac Activity:  Observed  Presentation:           Cephalic  Placenta:               Anterior  P. Cord Insertion:      Previously Visualized  Amniotic Fluid  AFI FV:      Within normal limits  AFI Sum(cm)     %Tile       Largest Pocket(cm)  16.19           58          6.93  RUQ(cm)       RLQ(cm)       LUQ(cm)        LLQ(cm)  2.07          2.67          4.52           6.93 ---------------------------------------------------------------------- Biometry  BPD:      73.3  mm     G. Age:  29w 3d        1.5  %    CI:        69.32   %    70 - 86                                                          FL/HC:      20.5   %    19.1 - 21.3  HC:      281.1  mm     G. Age:  30w 6d        3.9  %    HC/AC:      0.96        0.96 - 1.17  AC:      292.2  mm     G. Age:  33w 2d         87  %    FL/BPD:     78.7   %    71 - 87  FL:       57.7  mm     G. Age:  30w 1d          7  %    FL/AC:      19.7   %    20 - 24  Est. FW:    1829  gm      4 lb 1 oz     39  % ---------------------------------------------------------------------- OB History  Gravidity:    3         Term:   2  Living:       2 ---------------------------------------------------------------------- Gestational Age  U/S Today:     31w 0d                                        EDD:   01/23/20  Best:          31w 5d     Det. ByMarcella Dubs:  Early Ultrasound         EDD:   01/18/20                                      (  07/03/19) ---------------------------------------------------------------------- Anatomy  Cranium:               Appears normal         Heart:                  Appears normal                                                                        (4CH, axis, and                                                                        situs)  Cavum:                 Appears normal         LVOT:                   Appears normal  Ventricles:             Appears normal         Ductal Arch:            Not well visualized  Choroid Plexus:        Appears normal         Stomach:                Appears normal, left                                                                        sided  Cerebellum:            Appears normal         Kidneys:                Appear normal  Posterior Fossa:       Appears normal         Bladder:                Appears normal  Face:                  Profile appears                         normal  Other:  Technicallly difficult due to advanced GA and maternal habitus. ---------------------------------------------------------------------- Cervix Uterus Adnexa  Cervix  Normal appearance by transabdominal scan. ---------------------------------------------------------------------- Impression  Patient has prediabetes (not pregestational diabetes). She  had screening that ruled out gestational diabetes. At her  previous prenatal visits, gestational hypertension was  suspected. Blood pressure today at our office is 140/77 mm  Hg. She has minimal headache and gives history of blurry  vision. No right upper quadrant pain now.  Patient informed  she feels well now.  Amniotic fluid is normal and good fetal activity is seen .Fetal  growth is appropriate for gestational age .  I counseled her on the possibility of gestational  hypertension/preeclampsia. However, she needs frequent  blood pressure monitoring. Patient has blood pressure cuff at  home will check her blood pressures. We discussed normal  parameters.  Because of the possibility of gestational hypertension, I  recommended weekly antenatal testing. She lives in [place]  and would like to return in 2 weeks. ---------------------------------------------------------------------- Recommendations  -BPP in 2 weeks and then weekly till delivery.  -If gestational hypertension is confirmed, we recommend  delivery at 67 weeks' gestation.  ----------------------------------------------------------------------                  Noralee Space, MD Electronically Signed Final Report   11/21/2019 04:59 pm ----------------------------------------------------------------------   Assessment and Plan:  Pregnancy: [id number] at [redacted]w[redacted]d 1. Supervision of high risk pregnancy, antepartum   2. History of gestational hypertension Has some headache, relieved with tylenol ? CHTN vs. GHTN, but elevations in early pregnancy see below--will possibly impact timing of delivery Labs with u/s for growth Tuesday    3. Previous cesarean delivery affecting pregnancy, antepartum X 2, scheduled for RCS  4. BMI 40.0-44.9, adult (HCC)   Preterm labor symptoms and general obstetric precautions including but not limited to vaginal bleeding, contractions, leaking of fluid and fetal movement were reviewed in detail with the patient. I discussed the assessment and treatment plan with the patient. The patient was provided an opportunity to ask questions and all were answered. The patient agreed with the plan and demonstrated an understanding of the instructions. The patient was advised to call back or seek an in-person office evaluation/go to [facility] at [facility] for any urgent or concerning symptoms. Please refer to After Visit Summary for other counseling recommendations.   I provided 12 minutes of face-to-face time during this encounter.  Return in 1 week (on 12/06/2019) for in person next Tuesday, [facility] coincide with u/s if possible.  Future Appointments  Date Time Provider Department Center  11/30/2019  8:00 AM Tobb, Kardie, DO [facility] None  12/05/2019  3:30 PM [facility] NURSE [facility]  12/05/2019  3:45 PM [facility] US4 [facility]  12/08/2019  9:40 AM Tamora Bing, MD [facility]  12/12/2019  3:30 PM [facility] NURSE [facility]  12/12/2019  3:45 PM [facility] US5 [facility]  12/21/2019  3:30 PM [facility] NURSE [facility]  12/21/2019  3:45 PM  [facility] US4 [facility]    Reva Bores, MD [facility] for [employer], [facility]

## 2019-11-30 ENCOUNTER — Ambulatory Visit (INDEPENDENT_AMBULATORY_CARE_PROVIDER_SITE_OTHER): Payer: Medicaid Other | Admitting: Cardiology

## 2019-11-30 ENCOUNTER — Encounter: Payer: Self-pay | Admitting: Cardiology

## 2019-11-30 ENCOUNTER — Other Ambulatory Visit: Payer: Self-pay

## 2019-11-30 VITALS — BP 122/78 | HR 96 | Ht 65.0 in | Wt 325.0 lb

## 2019-11-30 DIAGNOSIS — I471 Supraventricular tachycardia, unspecified: Secondary | ICD-10-CM

## 2019-11-30 DIAGNOSIS — O99210 Obesity complicating pregnancy, unspecified trimester: Secondary | ICD-10-CM

## 2019-11-30 DIAGNOSIS — Z8759 Personal history of other complications of pregnancy, childbirth and the puerperium: Secondary | ICD-10-CM

## 2019-11-30 DIAGNOSIS — R7303 Prediabetes: Secondary | ICD-10-CM

## 2019-11-30 DIAGNOSIS — Z6841 Body Mass Index (BMI) 40.0 and over, adult: Secondary | ICD-10-CM

## 2019-11-30 MED ORDER — PREPLUS 27-1 MG PO TABS: 1.0000 | ORAL_TABLET | Freq: Every day | ORAL | 3 refills | Status: DC

## 2019-11-30 NOTE — Progress Notes (Signed)
Cardiology Office Note:    Date:  11/30/2019   ID:  Veronica Boyle, DOB Sep 05, 1990, MRN [id number]  PCP:  Patient, No Pcp Per  Cardiologist:  Berniece Salines, DO  Electrophysiologist:  None   Referring MD: No ref. provider found   Follow up  History of Present Illness:    Veronica Boyle is a 29 y.o. female with a hx of prediabetes, obesity, previous history of gestational hypertension, history of preeclampsia, anxiety and paroxysmal supraventricular tachycardia.  She is [redacted] weeks pregnant (G3 P2), with previous cesarean sections.   I last saw the patient on October 17, 2019 at that time she was 27 weeks and presented with complaints of palpitations.  Per patient in the past she was treated for SVT with metoprolol and stopped this medication several years ago.  Prior to her visit with me she had been seen in emergency department. Due to her history of SVT I placed a monitor and the patient.  She did wear the monitor in the interim the study was normal and this was reported to the patient.  Today patient tells me that she has had intermittent headaches but was able to have to stop by Tylenol.  She notes that she also is experiencing some heartburn.  She denies any worsening shortness of breath from baseline, no blurry visions and the palpitations has improved.  The patient also did see her OB/GYN via virtual visit on November 29, 2019.  At that visit there was discussion about her blood pressure with questionable concern for chronic hypertension versus gestational hypertension.  Plan is to repeat an ultrasound as well as get lab work to assess for preeclampsia and make decisions on appropriate time for delivery.  No other complaints at this time.  Past Medical History:  Diagnosis Date  . Arrhythmia   . Atypical chest pain 11/15/2014   [facility] 2016 and stress echo done.   . Gestational hypertension   . Prediabetes   . SVT (supraventricular tachycardia) (HCC)     Past Surgical History:    Procedure Laterality Date  . CESAREAN SECTION    . CESAREAN SECTION N/A 01/25/2014   Procedure: CESAREAN SECTION;  Surgeon: Jonnie Kind, MD;  Location: [facility];  Service: Obstetrics;  Laterality: N/A;  . TONSILLECTOMY    . WISDOM TOOTH EXTRACTION  2012    Current Medications: Current Meds  Medication Sig  . acetaminophen (TYLENOL) 500 MG tablet Take 1,000 mg by mouth every 6 (six) hours as needed for headache.   Marland Kitchen aspirin EC 81 MG tablet Take 1 tablet (81 mg total) by mouth daily.  . Blood Pressure Monitoring (BLOOD PRESSURE KIT) DEVI 1 Device by Does not apply route as needed.  . cetirizine (ZYRTEC) 10 MG tablet Take 10 mg by mouth daily.  . Elastic Bandages & Supports (COMFORT FIT MATERNITY SUPP LG) MISC 1 Units by Does not apply route daily as needed.  . ferrous sulfate 325 (65 FE) MG EC tablet Take 1 tablet (325 mg total) by mouth daily with breakfast.  . Prenatal Vit-Fe Fumarate-FA (PREPLUS) 27-1 MG TABS Take 1 tablet by mouth daily.  . Vitamin D, Ergocalciferol, (DRISDOL) 1.25 MG (50000 UNIT) CAPS capsule Take 1 capsule (50,000 Units total) by mouth every 7 (seven) days for 12 doses.     Allergies:   Vancomycin   Social History   Socioeconomic History  . Marital status: Single    Spouse name: Not on file  . Number of children:  Not on file  . Years of education: Not on file  . Highest education level: Not on file  Occupational History  . Not on file  Tobacco Use  . Smoking status: Never Smoker  . Smokeless tobacco: Never Used  Vaping Use  . Vaping Use: Never used  Substance and Sexual Activity  . Alcohol use: No  . Drug use: No  . Sexual activity: Yes    Birth control/protection: None  Other Topics Concern  . Not on file  Social History Narrative  . Not on file   Social Determinants of Health   Financial Resource Strain:   . Difficulty of Paying Living Expenses: Not on file  Food Insecurity: No Food Insecurity  . Worried About Charity fundraiser in the  Last Year: Never true  . Ran Out of Food in the Last Year: Never true  Transportation Needs: No Transportation Needs  . Lack of Transportation (Medical): No  . Lack of Transportation (Non-Medical): No  Physical Activity:   . Days of Exercise per Week: Not on file  . Minutes of Exercise per Session: Not on file  Stress:   . Feeling of Stress : Not on file  Social Connections:   . Frequency of Communication with Friends and Family: Not on file  . Frequency of Social Gatherings with Friends and Family: Not on file  . Attends Religious Services: Not on file  . Active Member of Clubs or Organizations: Not on file  . Attends Archivist Meetings: Not on file  . Marital Status: Not on file     Family History: The patient's family history includes Asthma in her brother, father, and sister; Birth defects in her daughter; Diabetes in her mother; Hyperlipidemia in her mother; Hypertension in her mother.  ROS:   Review of Systems  Constitution: Negative for decreased appetite, fever and weight gain.  HENT: Negative for congestion, ear discharge, hoarse voice and sore throat.   Eyes: Negative for discharge, redness, vision loss in right eye and visual halos.  Cardiovascular: Negative for chest pain, dyspnea on exertion, leg swelling, orthopnea and palpitations.  Respiratory: Negative for cough, hemoptysis, shortness of breath and snoring.   Endocrine: Negative for heat intolerance and polyphagia.  Hematologic/Lymphatic: Negative for bleeding problem. Does not bruise/bleed easily.  Skin: Negative for flushing, nail changes, rash and suspicious lesions.  Musculoskeletal: Negative for arthritis, joint pain, muscle cramps, myalgias, neck pain and stiffness.  Gastrointestinal: Negative for abdominal pain, bowel incontinence, diarrhea and excessive appetite.  Genitourinary: Negative for decreased libido, genital sores and incomplete emptying.  Neurological: Negative for brief paralysis,  focal weakness, headaches and loss of balance.  Psychiatric/Behavioral: Negative for altered mental status, depression and suicidal ideas.  Allergic/Immunologic: Negative for HIV exposure and persistent infections.    EKGs/Labs/Other Studies Reviewed:    The following studies were reviewed today:   EKG: None today  Zio monitor The patient wore the monitor for 7 days starting October 17, 2019. Indication: Palpitations The minimum heart rate was 58 bpm, maximum heart rate was 156  bpm, and average heart rate was 98 bpm. Predominant underlying rhythm was Sinus Rhythm.   Premature atrial complexes were rare less than 1%. Premature Ventricular complexes rare less than 1%.  No ventricular tachycardia, no pauses, No AV block, no supraventricular tachycardia and no atrial fibrillation present.  2 patient triggered events into diary events all associated with sinus rhythm.  Conclusion: Normal/unremarkable study.   Echo IMPRESSIONS November 02, 2019 1. Left  ventricular ejection fraction, by estimation, is 60 to 65%. The left ventricle has normal function. The left ventricle has no regional wall motion abnormalities. Left ventricular diastolic parameters were  normal.  2. Right ventricular systolic function is normal. The right ventricular size is normal. There is normal pulmonary artery systolic pressure.  3. The mitral valve is normal in structure. No evidence of mitral valve regurgitation. No evidence of mitral stenosis.  4. The aortic valve is normal in structure. Aortic valve regurgitation is not visualized. No aortic stenosis is present.  5. The inferior vena cava is normal in size with greater than 50% respiratory variability, suggesting right atrial pressure of 3 mmHg.  6. Possibly small PFO with Lt to Rt flow. Seen only in subcostal views.    Recent Labs: 10/17/2019: TSH 0.869 10/31/2019: ALT 16; BUN 6; Creatinine, Ser 0.45; Hemoglobin 11.5; Platelets 237; Potassium 4.1; Sodium  135  Recent Lipid Panel    Component Value Date/Time   CHOL 159 01/19/2019 1157   TRIG 255 (H) 01/19/2019 1157   HDL 33 (L) 01/19/2019 1157   CHOLHDL 4.8 (H) 01/19/2019 1157   LDLCALC 84 01/19/2019 1157    Physical Exam:    VS:  BP 122/78 (BP Location: Left Arm, Patient Position: Sitting, Cuff Size: Large)   Pulse 96   Ht $R'5\' 5"'[place]$  (1.651 m)   Wt (!) 325 lb (147.4 kg)   LMP  (LMP Unknown)   SpO2 97%   BMI 54.08 kg/m     Wt Readings from Last 3 Encounters:  11/30/19 (!) 325 lb (147.4 kg)  11/14/19 (!) 322 lb 9.6 oz (146.3 kg)  10/31/19 (!) 323 lb 14.4 oz (146.9 kg)     GEN: Well nourished, well developed in no acute distress HEENT: Normal NECK: No JVD; No carotid bruits LYMPHATICS: No lymphadenopathy CARDIAC: S1S2 noted,RRR, no murmurs, rubs, gallops RESPIRATORY:  Clear to auscultation without rales, wheezing or rhonchi  ABDOMEN: Soft, non-tender, non-distended, +bowel sounds, no guarding. EXTREMITIES: No edema, No cyanosis, no clubbing MUSCULOSKELETAL:  No deformity  SKIN: Warm and dry NEUROLOGIC:  Alert and oriented x 3, non-focal PSYCHIATRIC:  Normal affect, good insight  ASSESSMENT:    1. History of gestational hypertension   2. Paroxysmal supraventricular tachycardia ([place])   3. BMI 40.0-44.9, adult ([place])   4. Obesity in pregnancy   5. Prediabetes    PLAN:    Her blood pressure today in the office manually taken by me 122/78 mmHg, she has had interval increased blood pressure which August and is reported to be 140/77.  She is planning on getting labs with her OB/GYN on Tuesday which will help [place] understand more if she is preeclamptic.  The actual question here is that she have chronic hypertension which this may be the case given the fact that her blood pressure may have been elevated before 20 weeks of gestation.  I have discussed with the patient in great detail that we will have to monitor her closely and pulse partum she will need to be continue to be monitored  as well.    She will continue on aspirin for now.  Ideally, if she makes it a 36 weeks she can stop her aspirin.  But I am going to defer this to her OB/GYN.  If she needs oral antihypertensive medication given the fact that she has a history of SVT I will recommend a low-dose beta-blocker.   Thankfully the palpitations has improved significantly.  And to review of her monitor which  I discussed with her again there is no evidence of supraventricular tachycardia at this time.  The patient is in agreement with the above plan. The patient left the office in stable condition.  The patient will follow up in 3 months which will be a virtual visit.   Medication Adjustments/Labs and Tests Ordered: Current medicines are reviewed at length with the patient today.  Concerns regarding medicines are outlined above.  No orders of the defined types were placed in this encounter.  No orders of the defined types were placed in this encounter.   Patient Instructions  Medication Instructions:  No medication changes. *If you need a refill on your cardiac medications before your next appointment, please call your pharmacy*   Lab Work: None ordered If you have labs (blood work) drawn today and your tests are completely normal, you will receive your results only by: Marland Kitchen MyChart Message (if you have MyChart) OR . A paper copy in the mail If you have any lab test that is abnormal or we need to change your treatment, we will call you to review the results.   Testing/Procedures: None ordered   Follow-Up: At [facility], you and your health needs are our priority.  As part of our continuing mission to provide you with exceptional heart care, we have created designated Provider Care Teams.  These Care Teams include your primary Cardiologist (physician) and Advanced Practice Providers (APPs -  Physician Assistants and Nurse Practitioners) who all work together to provide you with the care you need, when you  need it.  We recommend signing up for the patient portal called "MyChart".  Sign up information is provided on this After Visit Summary.  MyChart is used to connect with patients for Virtual Visits (Telemedicine).  Patients are able to view lab/test results, encounter notes, upcoming appointments, etc.  Non-urgent messages can be sent to your provider as well.   To learn more about what you can do with MyChart, go to [email or web address].    Your next appointment:   3 month(s)  The format for your next appointment:   Virtual Visit   Provider:   Berniece Salines, DO   Other Instructions NA     Adopting a Healthy Lifestyle.  Know what a healthy weight is for you (roughly BMI <25) and aim to maintain this   Aim for 7+ servings of fruits and vegetables daily   65-80+ fluid ounces of water or unsweet tea for healthy kidneys   Limit to max 1 drink of alcohol per day; avoid smoking/tobacco   Limit animal fats in diet for cholesterol and heart health - choose grass fed whenever available   Avoid highly processed foods, and foods high in saturated/trans fats   Aim for low stress - take time to unwind and care for your mental health   Aim for 150 min of moderate intensity exercise weekly for heart health, and weights twice weekly for bone health   Aim for 7-9 hours of sleep daily   When it comes to diets, agreement about the perfect plan isnt easy to find, even among the experts. Experts at the [place] developed an idea known as the Healthy Eating Plate. Just imagine a plate divided into logical, healthy portions.   The emphasis is on diet quality:   Load up on vegetables and fruits - one-half of your plate: Aim for color and variety, and remember that potatoes dont count.   Go for whole grains - one-quarter  of your plate: Whole wheat, barley, wheat berries, quinoa, oats, brown rice, and foods made with them. If you want pasta, go with whole wheat pasta.    Protein power - one-quarter of your plate: Fish, chicken, beans, and nuts are all healthy, versatile protein sources. Limit red meat.   The diet, however, does go beyond the plate, offering a few other suggestions.   Use healthy plant oils, such as olive, canola, soy, corn, sunflower and peanut. Check the labels, and avoid partially hydrogenated oil, which have unhealthy trans fats.   If youre thirsty, drink water. Coffee and tea are good in moderation, but skip sugary drinks and limit milk and dairy products to one or two daily servings.   The type of carbohydrate in the diet is more important than the amount. Some sources of carbohydrates, such as vegetables, fruits, whole grains, and beans-are healthier than others.   Finally, stay active  Signed, Berniece Salines, DO  11/30/2019 8:49 AM    [facility]

## 2019-11-30 NOTE — Patient Instructions (Signed)
Medication Instructions:  No medication changes. *If you need a refill on your cardiac medications before your next appointment, please call your pharmacy*   Lab Work: None ordered If you have labs (blood work) drawn today and your tests are completely normal, you will receive your results only by:  MyChart Message (if you have MyChart) OR  A paper copy in the mail If you have any lab test that is abnormal or we need to change your treatment, we will call you to review the results.   Testing/Procedures: None ordered   Follow-Up: At [facility], you and your health needs are our priority.  As part of our continuing mission to provide you with exceptional heart care, we have created designated Provider Care Teams.  These Care Teams include your primary Cardiologist (physician) and Advanced Practice Providers (APPs -  Physician Assistants and Nurse Practitioners) who all work together to provide you with the care you need, when you need it.  We recommend signing up for the patient portal called "MyChart".  Sign up information is provided on this After Visit Summary.  MyChart is used to connect with patients for Virtual Visits (Telemedicine).  Patients are able to view lab/test results, encounter notes, upcoming appointments, etc.  Non-urgent messages can be sent to your provider as well.   To learn more about what you can do with MyChart, go to [email or web address].    Your next appointment:   3 month(s)  The format for your next appointment:   Virtual Visit   Provider:   Thomasene Ripple, DO   Other Instructions NA

## 2019-12-05 ENCOUNTER — Ambulatory Visit: Payer: Medicaid Other | Admitting: *Deleted

## 2019-12-05 ENCOUNTER — Encounter: Payer: Self-pay | Admitting: *Deleted

## 2019-12-05 ENCOUNTER — Ambulatory Visit: Payer: Medicaid Other | Attending: Obstetrics and Gynecology

## 2019-12-05 ENCOUNTER — Other Ambulatory Visit: Payer: Self-pay

## 2019-12-05 DIAGNOSIS — Z8759 Personal history of other complications of pregnancy, childbirth and the puerperium: Secondary | ICD-10-CM

## 2019-12-05 DIAGNOSIS — O09293 Supervision of pregnancy with other poor reproductive or obstetric history, third trimester: Secondary | ICD-10-CM

## 2019-12-05 DIAGNOSIS — O34219 Maternal care for unspecified type scar from previous cesarean delivery: Secondary | ICD-10-CM

## 2019-12-05 DIAGNOSIS — O0990 Supervision of high risk pregnancy, unspecified, unspecified trimester: Secondary | ICD-10-CM

## 2019-12-05 DIAGNOSIS — O24113 Pre-existing diabetes mellitus, type 2, in pregnancy, third trimester: Secondary | ICD-10-CM | POA: Diagnosis not present

## 2019-12-05 DIAGNOSIS — Z3A33 33 weeks gestation of pregnancy: Secondary | ICD-10-CM

## 2019-12-05 DIAGNOSIS — O24313 Unspecified pre-existing diabetes mellitus in pregnancy, third trimester: Secondary | ICD-10-CM | POA: Diagnosis not present

## 2019-12-05 DIAGNOSIS — Z6841 Body Mass Index (BMI) 40.0 and over, adult: Secondary | ICD-10-CM

## 2019-12-05 DIAGNOSIS — O99213 Obesity complicating pregnancy, third trimester: Secondary | ICD-10-CM | POA: Diagnosis not present

## 2019-12-08 ENCOUNTER — Other Ambulatory Visit: Payer: Self-pay

## 2019-12-08 ENCOUNTER — Ambulatory Visit (INDEPENDENT_AMBULATORY_CARE_PROVIDER_SITE_OTHER): Payer: Medicaid Other | Admitting: Obstetrics and Gynecology

## 2019-12-08 VITALS — BP 126/83 | HR 100 | Wt 326.0 lb

## 2019-12-08 DIAGNOSIS — O09299 Supervision of pregnancy with other poor reproductive or obstetric history, unspecified trimester: Secondary | ICD-10-CM

## 2019-12-08 DIAGNOSIS — Z3009 Encounter for other general counseling and advice on contraception: Secondary | ICD-10-CM

## 2019-12-08 DIAGNOSIS — O0990 Supervision of high risk pregnancy, unspecified, unspecified trimester: Secondary | ICD-10-CM

## 2019-12-08 DIAGNOSIS — I471 Supraventricular tachycardia: Secondary | ICD-10-CM

## 2019-12-08 DIAGNOSIS — O34219 Maternal care for unspecified type scar from previous cesarean delivery: Secondary | ICD-10-CM

## 2019-12-08 DIAGNOSIS — Z6841 Body Mass Index (BMI) 40.0 and over, adult: Secondary | ICD-10-CM

## 2019-12-08 DIAGNOSIS — O99210 Obesity complicating pregnancy, unspecified trimester: Secondary | ICD-10-CM

## 2019-12-08 DIAGNOSIS — Z8759 Personal history of other complications of pregnancy, childbirth and the puerperium: Secondary | ICD-10-CM

## 2019-12-08 DIAGNOSIS — R002 Palpitations: Secondary | ICD-10-CM

## 2019-12-08 DIAGNOSIS — Z3A34 34 weeks gestation of pregnancy: Secondary | ICD-10-CM

## 2019-12-08 NOTE — Progress Notes (Signed)
  Prenatal Visit Note Date: 12/08/2019 Clinic: [facility]   Subjective:  Veronica Boyle is a 29 y.o. G3P2002 at [redacted]w[redacted]d being seen today for ongoing prenatal care.  She is currently monitored for the following issues for this high-risk pregnancy and has History of fetal abnormality in previous pregnancy, currently pregnant; Previous cesarean delivery affecting pregnancy, antepartum; Paroxysmal supraventricular tachycardia (HCC); Palpitations; Anxiety; Psychogenic syncope; Supervision of high risk pregnancy, antepartum; History of gestational hypertension; BMI 40.0-44.9, adult (HCC); Obesity in pregnancy; Sciatic leg pain; Low vitamin D level; Unwanted fertility; Tachycardia; Shortness of breath; Numbness and tingling in both hands; and Prediabetes on their problem list.  Patient reports she was told to make this early follow up appointment due to elevated BPs at home. No s/s of pre-eclampsia  Contractions: Not present. Vag. Bleeding: None.  Movement: Present. Denies leaking of fluid.   The following portions of the patient's history were reviewed and updated as appropriate: allergies, current medications, past family history, past medical history, past social history, past surgical history and problem list. Problem list updated.  Objective:   Vitals:   12/08/19 1011  BP: 126/83  Pulse: 100  Weight: (!) 326 lb (147.9 kg)   Home BP cuff brought in and BP is 149/70  Fetal Status: Fetal Heart Rate (bpm): 150   Movement: Present     General:  Alert, oriented and cooperative. Patient is in no acute distress.  Skin: Skin is warm and dry. No rash noted.   Cardiovascular: Normal heart rate noted  Respiratory: Normal respiratory effort, no problems with respiration noted  Abdomen: Soft, gravid, appropriate for gestational age. Pain/Pressure: Absent     Pelvic:  Cervical exam deferred        Extremities: Normal range of motion.  Edema: None  Mental Status: Normal mood and  affect. Normal behavior. Normal judgment and thought content.   Urinalysis:      Assessment and Plan:  Pregnancy: G3P2002 at [redacted]w[redacted]d  1. CV Seen by Cards 11/2019 and her in office bp was normal even though her home was also giving a mild range BP. I told her to d/c using her home cuff. No e/o SVT currently and plan for 35m RTC. Normal EF 10/2019. Pt on no CV meds currently   2. BMI 40.0-44.9, adult (HCC)  3. Obesity in pregnancy  4. History of fetal abnormality in previous pregnancy, currently pregnant No issues  5. History of gestational hypertension  6. Palpitations  7. Previous cesarean delivery affecting pregnancy, antepartum Scheduled for rpt already  8. Supervision of high risk pregnancy, antepartum Pink note says btl papers signed on 6/22. After the visit, I checked and didn't see this. Pink noted updated to sign btl papers again at [facility].  8/24 bpp 8/8. Getting weekly testing due to ?gHTN.   9. Unwanted fertility  10. [redacted] weeks gestation of pregnancy  Preterm labor symptoms and general obstetric precautions including but not limited to vaginal bleeding, contractions, leaking of fluid and fetal movement were reviewed in detail with the patient. Please refer to After Visit Summary for other counseling recommendations.  Return in about 13 days (around 12/21/2019) for high risk, in person.   [place] Bing, MD

## 2019-12-12 ENCOUNTER — Ambulatory Visit: Payer: Medicaid Other | Attending: Obstetrics and Gynecology

## 2019-12-12 ENCOUNTER — Ambulatory Visit: Payer: Medicaid Other | Admitting: *Deleted

## 2019-12-12 ENCOUNTER — Other Ambulatory Visit: Payer: Self-pay

## 2019-12-12 ENCOUNTER — Encounter: Payer: Self-pay | Admitting: *Deleted

## 2019-12-12 DIAGNOSIS — Z3A34 34 weeks gestation of pregnancy: Secondary | ICD-10-CM | POA: Diagnosis not present

## 2019-12-12 DIAGNOSIS — O09293 Supervision of pregnancy with other poor reproductive or obstetric history, third trimester: Secondary | ICD-10-CM | POA: Diagnosis not present

## 2019-12-12 DIAGNOSIS — O34219 Maternal care for unspecified type scar from previous cesarean delivery: Secondary | ICD-10-CM

## 2019-12-12 DIAGNOSIS — O24313 Unspecified pre-existing diabetes mellitus in pregnancy, third trimester: Secondary | ICD-10-CM

## 2019-12-12 DIAGNOSIS — O24113 Pre-existing diabetes mellitus, type 2, in pregnancy, third trimester: Secondary | ICD-10-CM | POA: Diagnosis not present

## 2019-12-12 DIAGNOSIS — O99213 Obesity complicating pregnancy, third trimester: Secondary | ICD-10-CM | POA: Diagnosis not present

## 2019-12-12 DIAGNOSIS — Z8759 Personal history of other complications of pregnancy, childbirth and the puerperium: Secondary | ICD-10-CM | POA: Insufficient documentation

## 2019-12-12 DIAGNOSIS — O0990 Supervision of high risk pregnancy, unspecified, unspecified trimester: Secondary | ICD-10-CM

## 2019-12-12 DIAGNOSIS — Z6841 Body Mass Index (BMI) 40.0 and over, adult: Secondary | ICD-10-CM

## 2019-12-13 ENCOUNTER — Other Ambulatory Visit: Payer: Self-pay | Admitting: *Deleted

## 2019-12-13 DIAGNOSIS — O99210 Obesity complicating pregnancy, unspecified trimester: Secondary | ICD-10-CM

## 2019-12-21 ENCOUNTER — Ambulatory Visit: Payer: Medicaid Other

## 2019-12-22 ENCOUNTER — Encounter: Payer: Self-pay | Admitting: Obstetrics and Gynecology

## 2019-12-22 ENCOUNTER — Ambulatory Visit (INDEPENDENT_AMBULATORY_CARE_PROVIDER_SITE_OTHER): Payer: Medicaid Other | Admitting: Obstetrics and Gynecology

## 2019-12-22 ENCOUNTER — Other Ambulatory Visit: Payer: Self-pay | Admitting: Obstetrics and Gynecology

## 2019-12-22 ENCOUNTER — Other Ambulatory Visit: Payer: Self-pay

## 2019-12-22 ENCOUNTER — Ambulatory Visit: Payer: Medicaid Other | Attending: Obstetrics and Gynecology

## 2019-12-22 ENCOUNTER — Other Ambulatory Visit: Payer: Self-pay | Admitting: *Deleted

## 2019-12-22 ENCOUNTER — Other Ambulatory Visit (HOSPITAL_COMMUNITY)
Admission: RE | Admit: 2019-12-22 | Discharge: 2019-12-22 | Disposition: A | Discharge status: Home / Self Care | Payer: Medicaid Other | Source: Ambulatory Visit | Attending: Obstetrics and Gynecology | Admitting: Obstetrics and Gynecology

## 2019-12-22 ENCOUNTER — Encounter: Payer: Self-pay | Admitting: *Deleted

## 2019-12-22 ENCOUNTER — Ambulatory Visit: Payer: Medicaid Other | Admitting: *Deleted

## 2019-12-22 VITALS — BP 130/82 | HR 110 | Wt 328.9 lb

## 2019-12-22 DIAGNOSIS — Z7189 Other specified counseling: Secondary | ICD-10-CM

## 2019-12-22 DIAGNOSIS — O34219 Maternal care for unspecified type scar from previous cesarean delivery: Secondary | ICD-10-CM

## 2019-12-22 DIAGNOSIS — Z362 Encounter for other antenatal screening follow-up: Secondary | ICD-10-CM

## 2019-12-22 DIAGNOSIS — O09299 Supervision of pregnancy with other poor reproductive or obstetric history, unspecified trimester: Secondary | ICD-10-CM

## 2019-12-22 DIAGNOSIS — O0990 Supervision of high risk pregnancy, unspecified, unspecified trimester: Secondary | ICD-10-CM | POA: Insufficient documentation

## 2019-12-22 DIAGNOSIS — O0993 Supervision of high risk pregnancy, unspecified, third trimester: Secondary | ICD-10-CM | POA: Diagnosis not present

## 2019-12-22 DIAGNOSIS — Z6841 Body Mass Index (BMI) 40.0 and over, adult: Secondary | ICD-10-CM

## 2019-12-22 DIAGNOSIS — O24313 Unspecified pre-existing diabetes mellitus in pregnancy, third trimester: Secondary | ICD-10-CM | POA: Diagnosis not present

## 2019-12-22 DIAGNOSIS — O09293 Supervision of pregnancy with other poor reproductive or obstetric history, third trimester: Secondary | ICD-10-CM

## 2019-12-22 DIAGNOSIS — I471 Supraventricular tachycardia: Secondary | ICD-10-CM

## 2019-12-22 DIAGNOSIS — Z23 Encounter for immunization: Secondary | ICD-10-CM

## 2019-12-22 DIAGNOSIS — Z3A36 36 weeks gestation of pregnancy: Secondary | ICD-10-CM

## 2019-12-22 DIAGNOSIS — Z8759 Personal history of other complications of pregnancy, childbirth and the puerperium: Secondary | ICD-10-CM

## 2019-12-22 DIAGNOSIS — Z3009 Encounter for other general counseling and advice on contraception: Secondary | ICD-10-CM

## 2019-12-22 DIAGNOSIS — O99213 Obesity complicating pregnancy, third trimester: Secondary | ICD-10-CM | POA: Diagnosis not present

## 2019-12-22 NOTE — Progress Notes (Signed)
PRENATAL VISIT NOTE  Subjective:  Veronica Boyle is a 29 y.o. G3P2002 at [redacted]w[redacted]d being seen today for ongoing prenatal care.  She is currently monitored for the following issues for this high-risk pregnancy and has History of fetal abnormality in previous pregnancy, currently pregnant; Previous cesarean delivery affecting pregnancy, antepartum; Paroxysmal supraventricular tachycardia (HCC); Palpitations; Anxiety; Psychogenic syncope; Supervision of high risk pregnancy, antepartum; History of gestational hypertension; BMI 50.0-59.9, adult (HCC); Obesity in pregnancy; Sciatic leg pain; Low vitamin D level; Unwanted fertility; Tachycardia; Shortness of breath; Numbness and tingling in both hands; and Prediabetes on their problem list.  Patient reports headache this am that has improved with tylenol.  Contractions: Irritability. Vag. Bleeding: None.  Movement: Present. Denies leaking of fluid.   The following portions of the patient's history were reviewed and updated as appropriate: allergies, current medications, past family history, past medical history, past social history, past surgical history and problem list.   Objective:   Vitals:   12/22/19 0835  BP: 130/82  Pulse: (!) 110  Weight: (!) 328 lb 14.4 oz (149.2 kg)    Fetal Status:     Movement: Present     General:  Alert, oriented and cooperative. Patient is in no acute distress.  Skin: Skin is warm and dry. No rash noted.   Cardiovascular: Normal heart rate noted  Respiratory: Normal respiratory effort, no problems with respiration noted  Abdomen: Soft, gravid, appropriate for gestational age.  Pain/Pressure: Present     Pelvic: Cervical exam deferred        Extremities: Normal range of motion.  Edema: None  Mental Status: Normal mood and affect. Normal behavior. Normal judgment and thought content.   Assessment and Plan:  Pregnancy: G3P2002 at [redacted]w[redacted]d  1. Supervision of high risk pregnancy, antepartum - GC/Chlamydia probe amp  ([place])not at [facility] - Culture, beta strep (group b only) - Flu Vaccine QUAD 36+ mos IM  2. Previous cesarean delivery affecting pregnancy, antepartum Scheduled for RCS for 01/12/20  3. History of fetal abnormality in previous pregnancy, currently pregnant  4. Paroxysmal supraventricular tachycardia (HCC) asymptomatic  5. History of gestational hypertension  6. Unwanted fertility For BTL  7. BMI 50.0-59.9, adult (HCC)  8. [redacted] weeks gestation of pregnancy  9. Counseled about COVID-19 virus infection The patient was counseled on the potential benefits and lack of known risks of COVID vaccination, during pregnancy and breastfeeding, during today's visit. The patient's questions and concerns were addressed today, including safety of the vaccination and potential side effects as they have been published by ACOG and SMFM. The patient has been informed that there have not been any documented vaccine related injuries, deaths or birth defects to infant or mom after receiving the COVID-19 vaccine to date. The patient has been made aware that although she is not at increased risk of contracting COVID-19 during pregnancy, she is at increased risk of developing severe disease and complications if she contracts COVID-19 while pregnant. All patient questions were addressed during our visit today. The patient is planning to get vaccinated.    Preterm labor symptoms and general obstetric precautions including but not limited to vaginal bleeding, contractions, leaking of fluid and fetal movement were reviewed in detail with the patient. Please refer to After Visit Summary for other counseling recommendations.   Return in about 1 week (around 12/29/2019) for high OB, in person.  Future Appointments  Date Time Provider Department Center  12/22/2019  9:15 AM [facility] NURSE [facility]  12/22/2019  9:30 AM [facility] US[facility]  12/29/2019  8:15 AM Ryder Bing, MD [facility]  12/29/2019  9:15 AM  [facility] NURSE [facility]  12/29/2019  9:30 AM [facility] US3 [facility]  01/09/2020  8:55 AM Conan Bowens, MD [facility]  01/09/2020 10:15 AM [facility] NURSE [facility]  01/09/2020 10:30 AM [facility] US3 [facility]  03/04/2020  9:00 AM Tobb, [place]ona [place], DO [facility] None    Conan Bowens, MD

## 2019-12-22 NOTE — Patient Instructions (Signed)
COVID-19 Vaccine Information can be found at: [email or web address] For questions related to vaccine distribution or appointments, please email [email or web address] or call [phone].    

## 2019-12-25 LAB — GC/CHLAMYDIA PROBE AMP (~~LOC~~) NOT AT ARMC
Chlamydia: NEGATIVE
Comment: NEGATIVE
Comment: NORMAL
Neisseria Gonorrhea: NEGATIVE

## 2019-12-26 LAB — CULTURE, BETA STREP (GROUP B ONLY): Strep Gp B Culture: NEGATIVE

## 2019-12-29 ENCOUNTER — Ambulatory Visit: Payer: Medicaid Other | Admitting: *Deleted

## 2019-12-29 ENCOUNTER — Other Ambulatory Visit: Payer: Self-pay

## 2019-12-29 ENCOUNTER — Ambulatory Visit: Payer: Medicaid Other | Attending: Obstetrics and Gynecology

## 2019-12-29 ENCOUNTER — Encounter: Payer: Self-pay | Admitting: *Deleted

## 2019-12-29 ENCOUNTER — Ambulatory Visit (INDEPENDENT_AMBULATORY_CARE_PROVIDER_SITE_OTHER): Payer: Medicaid Other | Admitting: Obstetrics and Gynecology

## 2019-12-29 VITALS — BP 130/75 | HR 107 | Wt 331.0 lb

## 2019-12-29 DIAGNOSIS — Z6841 Body Mass Index (BMI) 40.0 and over, adult: Secondary | ICD-10-CM

## 2019-12-29 DIAGNOSIS — O24313 Unspecified pre-existing diabetes mellitus in pregnancy, third trimester: Secondary | ICD-10-CM

## 2019-12-29 DIAGNOSIS — O0990 Supervision of high risk pregnancy, unspecified, unspecified trimester: Secondary | ICD-10-CM

## 2019-12-29 DIAGNOSIS — Z3A37 37 weeks gestation of pregnancy: Secondary | ICD-10-CM | POA: Diagnosis not present

## 2019-12-29 DIAGNOSIS — O34219 Maternal care for unspecified type scar from previous cesarean delivery: Secondary | ICD-10-CM | POA: Diagnosis not present

## 2019-12-29 DIAGNOSIS — O09293 Supervision of pregnancy with other poor reproductive or obstetric history, third trimester: Secondary | ICD-10-CM | POA: Diagnosis not present

## 2019-12-29 DIAGNOSIS — Z8759 Personal history of other complications of pregnancy, childbirth and the puerperium: Secondary | ICD-10-CM | POA: Diagnosis present

## 2019-12-29 DIAGNOSIS — O99210 Obesity complicating pregnancy, unspecified trimester: Secondary | ICD-10-CM | POA: Insufficient documentation

## 2019-12-29 DIAGNOSIS — Z3009 Encounter for other general counseling and advice on contraception: Secondary | ICD-10-CM

## 2019-12-29 DIAGNOSIS — O10013 Pre-existing essential hypertension complicating pregnancy, third trimester: Secondary | ICD-10-CM | POA: Diagnosis not present

## 2019-12-29 DIAGNOSIS — O99213 Obesity complicating pregnancy, third trimester: Secondary | ICD-10-CM

## 2019-12-29 LAB — POCT URINALYSIS DIP (DEVICE)
Bilirubin Urine: NEGATIVE
Glucose, UA: NEGATIVE mg/dL
Hgb urine dipstick: NEGATIVE
Ketones, ur: NEGATIVE mg/dL
Nitrite: NEGATIVE
Protein, ur: NEGATIVE mg/dL
Specific Gravity, Urine: >=1.03 (ref 1.005–1.030)
Urobilinogen, UA: 0.2 mg/dL (ref 0.0–1.0)
pH: 6.5 (ref 5.0–8.0)

## 2019-12-29 NOTE — Progress Notes (Signed)
Prenatal Visit Note Date: 12/29/2019 Clinic: [facility]  Subjective:  Veronica Boyle is a 29 y.o. G3P2002 at [redacted]w[redacted]d being seen today for ongoing prenatal care.  She is currently monitored for the following issues for this high-risk pregnancy and has History of fetal abnormality in previous pregnancy, currently pregnant; Previous cesarean delivery affecting pregnancy, antepartum; Paroxysmal supraventricular tachycardia (HCC); Palpitations; Anxiety; Psychogenic syncope; Supervision of high risk pregnancy, antepartum; History of gestational hypertension; BMI 50.0-59.9, adult (HCC); Obesity in pregnancy; Sciatic leg pain; Low vitamin D level; Unwanted fertility; Tachycardia; Shortness of breath; Numbness and tingling in both hands; and Prediabetes on their problem list.  Patient reports pregnancy aches and pains. .   Contractions: Irritability. Vag. Bleeding: None.  Movement: Present. Denies leaking of fluid.   The following portions of the patient's history were reviewed and updated as appropriate: allergies, current medications, past family history, past medical history, past social history, past surgical history and problem list. Problem list updated.  Objective:   Vitals:   12/29/19 0830  BP: 130/75  Pulse: (!) 107  Weight: (!) 331 lb (150.1 kg)    Fetal Status: Fetal Heart Rate (bpm): 128   Movement: Present     General:  Alert, oriented and cooperative. Patient is in no acute distress.  Skin: Skin is warm and dry. No rash noted.   Cardiovascular: Normal heart rate noted  Respiratory: Normal respiratory effort, no problems with respiration noted  Abdomen: Soft, gravid, appropriate for gestational age. Pain/Pressure: Present     Pelvic:  Cervical exam deferred        Extremities: Normal range of motion.  Edema: Trace  Mental Status: Normal mood and affect. Normal behavior. Normal judgment and thought content.   Urinalysis:      Assessment and Plan:  Pregnancy:  G3P2002 at [redacted]w[redacted]d  1. BMI 50.0-59.9, adult (HCC) bpp later today  2. Unwanted fertility  3. Obesity in pregnancy  4. Supervision of high risk pregnancy, antepartum Routine care. BTL papers signed last week. It says GBS was collected and gc/ct is back. If results not back next week, call LC to see if results are back  Rpt bpp today for borderline high fluid last week. Normal growth last week   Strategies to help with pregnancy aches d/w pt.   5. Previous cesarean delivery affecting pregnancy, antepartum Scheduled for 39wks already  Term labor symptoms and general obstetric precautions including but not limited to vaginal bleeding, contractions, leaking of fluid and fetal movement were reviewed in detail with the patient. Please refer to After Visit Summary for other counseling recommendations.  Return in about 1 week (around 01/05/2020) for high risk, in person.   [place] Bing, MD

## 2020-01-05 ENCOUNTER — Encounter (HOSPITAL_COMMUNITY): Payer: Self-pay | Admitting: Obstetrics & Gynecology

## 2020-01-05 ENCOUNTER — Inpatient Hospital Stay (HOSPITAL_COMMUNITY): Payer: Medicaid Other | Admitting: Anesthesiology

## 2020-01-05 ENCOUNTER — Encounter: Payer: Medicaid Other | Admitting: Obstetrics and Gynecology

## 2020-01-05 ENCOUNTER — Ambulatory Visit: Payer: Medicaid Other

## 2020-01-05 ENCOUNTER — Inpatient Hospital Stay (HOSPITAL_COMMUNITY)
Admission: AD | Admit: 2020-01-05 | Discharge: 2020-01-07 | DRG: 783 | Disposition: A | Discharge status: Home / Self Care | Payer: Medicaid Other | Attending: Obstetrics & Gynecology | Admitting: Obstetrics & Gynecology

## 2020-01-05 ENCOUNTER — Other Ambulatory Visit: Payer: Self-pay

## 2020-01-05 ENCOUNTER — Encounter (HOSPITAL_COMMUNITY)
Admission: AD | Disposition: A | Discharge status: Home / Self Care | Payer: Self-pay | Source: Home / Self Care | Attending: Obstetrics & Gynecology

## 2020-01-05 ENCOUNTER — Ambulatory Visit: Payer: Medicaid Other | Attending: Obstetrics and Gynecology

## 2020-01-05 DIAGNOSIS — D62 Acute posthemorrhagic anemia: Secondary | ICD-10-CM | POA: Diagnosis not present

## 2020-01-05 DIAGNOSIS — Z302 Encounter for sterilization: Secondary | ICD-10-CM | POA: Diagnosis not present

## 2020-01-05 DIAGNOSIS — O26893 Other specified pregnancy related conditions, third trimester: Secondary | ICD-10-CM | POA: Diagnosis present

## 2020-01-05 DIAGNOSIS — Z20822 Contact with and (suspected) exposure to covid-19: Secondary | ICD-10-CM | POA: Diagnosis present

## 2020-01-05 DIAGNOSIS — O1002 Pre-existing essential hypertension complicating childbirth: Secondary | ICD-10-CM | POA: Diagnosis present

## 2020-01-05 DIAGNOSIS — O99214 Obesity complicating childbirth: Secondary | ICD-10-CM | POA: Diagnosis present

## 2020-01-05 DIAGNOSIS — O09299 Supervision of pregnancy with other poor reproductive or obstetric history, unspecified trimester: Secondary | ICD-10-CM

## 2020-01-05 DIAGNOSIS — O34211 Maternal care for low transverse scar from previous cesarean delivery: Principal | ICD-10-CM | POA: Diagnosis present

## 2020-01-05 DIAGNOSIS — O9942 Diseases of the circulatory system complicating childbirth: Secondary | ICD-10-CM | POA: Diagnosis present

## 2020-01-05 DIAGNOSIS — O1490 Unspecified pre-eclampsia, unspecified trimester: Secondary | ICD-10-CM | POA: Insufficient documentation

## 2020-01-05 DIAGNOSIS — O9081 Anemia of the puerperium: Secondary | ICD-10-CM | POA: Diagnosis not present

## 2020-01-05 DIAGNOSIS — I471 Supraventricular tachycardia, unspecified: Secondary | ICD-10-CM | POA: Diagnosis present

## 2020-01-05 DIAGNOSIS — Z3A38 38 weeks gestation of pregnancy: Secondary | ICD-10-CM

## 2020-01-05 DIAGNOSIS — Z6841 Body Mass Index (BMI) 40.0 and over, adult: Secondary | ICD-10-CM

## 2020-01-05 DIAGNOSIS — O99210 Obesity complicating pregnancy, unspecified trimester: Secondary | ICD-10-CM | POA: Diagnosis present

## 2020-01-05 DIAGNOSIS — O0990 Supervision of high risk pregnancy, unspecified, unspecified trimester: Secondary | ICD-10-CM

## 2020-01-05 DIAGNOSIS — R7303 Prediabetes: Secondary | ICD-10-CM | POA: Diagnosis present

## 2020-01-05 DIAGNOSIS — O4202 Full-term premature rupture of membranes, onset of labor within 24 hours of rupture: Secondary | ICD-10-CM

## 2020-01-05 DIAGNOSIS — Z8759 Personal history of other complications of pregnancy, childbirth and the puerperium: Secondary | ICD-10-CM

## 2020-01-05 DIAGNOSIS — Z9851 Tubal ligation status: Secondary | ICD-10-CM

## 2020-01-05 DIAGNOSIS — O34219 Maternal care for unspecified type scar from previous cesarean delivery: Secondary | ICD-10-CM | POA: Diagnosis present

## 2020-01-05 HISTORY — DX: Unspecified pre-eclampsia, unspecified trimester: O14.90

## 2020-01-05 LAB — URINALYSIS, ROUTINE W REFLEX MICROSCOPIC
Bilirubin Urine: NEGATIVE
Glucose, UA: NEGATIVE mg/dL
Ketones, ur: 40 mg/dL — AB
Leukocytes,Ua: NEGATIVE
Nitrite: NEGATIVE
Protein, ur: NEGATIVE mg/dL
Specific Gravity, Urine: 1.015 (ref 1.005–1.030)
pH: 6.5 (ref 5.0–8.0)

## 2020-01-05 LAB — COMPREHENSIVE METABOLIC PANEL
ALT: 18 U/L (ref 0–44)
AST: 32 U/L (ref 15–41)
Albumin: 2.3 g/dL — ABNORMAL LOW (ref 3.5–5.0)
Alkaline Phosphatase: 64 U/L (ref 38–126)
Anion gap: 10 (ref 5–15)
BUN: 5 mg/dL — ABNORMAL LOW (ref 6–20)
CO2: 17 mmol/L — ABNORMAL LOW (ref 22–32)
Calcium: 8.2 mg/dL — ABNORMAL LOW (ref 8.9–10.3)
Chloride: 106 mmol/L (ref 98–111)
Creatinine, Ser: 0.49 mg/dL (ref 0.44–1.00)
GFR calc Af Amer: >60 mL/min (ref >60)
GFR calc non Af Amer: >60 mL/min (ref >60)
Glucose, Bld: 88 mg/dL (ref 70–99)
Potassium: 3.8 mmol/L (ref 3.5–5.1)
Sodium: 133 mmol/L — ABNORMAL LOW (ref 135–145)
Total Bilirubin: 0.4 mg/dL (ref 0.3–1.2)
Total Protein: 5.5 g/dL — ABNORMAL LOW (ref 6.5–8.1)

## 2020-01-05 LAB — CBC
HCT: 34.4 % — ABNORMAL LOW (ref 36.0–46.0)
Hemoglobin: 10.9 g/dL — ABNORMAL LOW (ref 12.0–15.0)
MCH: 29 pg (ref 26.0–34.0)
MCHC: 31.7 g/dL (ref 30.0–36.0)
MCV: 91.5 fL (ref 80.0–100.0)
Platelets: 181 10*3/uL (ref 150–400)
RBC: 3.76 MIL/uL — ABNORMAL LOW (ref 3.87–5.11)
RDW: 12.7 % (ref 11.5–15.5)
WBC: 8.9 10*3/uL (ref 4.0–10.5)
nRBC: 0 % (ref 0.0–0.2)

## 2020-01-05 LAB — TYPE AND SCREEN
ABO/RH(D): O POS
Antibody Screen: NEGATIVE

## 2020-01-05 LAB — RPR: RPR Ser Ql: NONREACTIVE

## 2020-01-05 LAB — POCT FERN TEST: POCT Fern Test: POSITIVE

## 2020-01-05 LAB — URINALYSIS, MICROSCOPIC (REFLEX)

## 2020-01-05 LAB — PROTEIN / CREATININE RATIO, URINE
Creatinine, Urine: 81.44 mg/dL
Protein Creatinine Ratio: 0.42 mg/mg{Cre} — ABNORMAL HIGH (ref 0.00–0.15)
Total Protein, Urine: 34 mg/dL

## 2020-01-05 LAB — RESPIRATORY PANEL BY RT PCR (FLU A&B, COVID)
Influenza A by PCR: NEGATIVE
Influenza B by PCR: NEGATIVE
SARS Coronavirus 2 by RT PCR: NEGATIVE

## 2020-01-05 SURGERY — Surgical Case
Anesthesia: Spinal

## 2020-01-05 MED ORDER — METOCLOPRAMIDE HCL 5 MG/ML IJ SOLN
INTRAMUSCULAR | Status: DC | PRN
  Administered 2020-01-05 (×2): 5 mg via INTRAVENOUS

## 2020-01-05 MED ORDER — FAMOTIDINE IN NACL 20-0.9 MG/50ML-% IV SOLN
20.0000 mg | Freq: Once | INTRAVENOUS | Status: AC
  Administered 2020-01-05: 20 mg via INTRAVENOUS
  Filled 2020-01-05: qty 50

## 2020-01-05 MED ORDER — PHENYLEPHRINE HCL-NACL 20-0.9 MG/250ML-% IV SOLN
INTRAVENOUS | Status: DC | PRN
  Administered 2020-01-05: 60 ug/min via INTRAVENOUS

## 2020-01-05 MED ORDER — LACTATED RINGERS IV SOLN: INTRAVENOUS | Status: DC

## 2020-01-05 MED ORDER — DIPHENHYDRAMINE HCL 50 MG/ML IJ SOLN
INTRAMUSCULAR | Status: AC
  Filled 2020-01-05: qty 1

## 2020-01-05 MED ORDER — GABAPENTIN 100 MG PO CAPS
300.0000 mg | ORAL_CAPSULE | Freq: Two times a day (BID) | ORAL | Status: DC
  Administered 2020-01-05 – 2020-01-07 (×4): 300 mg via ORAL
  Filled 2020-01-05 (×4): qty 3

## 2020-01-05 MED ORDER — FENTANYL CITRATE (PF) 100 MCG/2ML IJ SOLN
INTRAMUSCULAR | Status: DC | PRN
Start: 2020-01-05 — End: 2020-01-05
  Administered 2020-01-05: 15 ug via INTRATHECAL

## 2020-01-05 MED ORDER — KETOROLAC TROMETHAMINE 30 MG/ML IJ SOLN: 30.0000 mg | Freq: Four times a day (QID) | INTRAMUSCULAR | Status: AC | PRN

## 2020-01-05 MED ORDER — LACTATED RINGERS IV SOLN: INTRAVENOUS | Status: DC | PRN

## 2020-01-05 MED ORDER — PHENYLEPHRINE 40 MCG/ML (10ML) SYRINGE FOR IV PUSH (FOR BLOOD PRESSURE SUPPORT)
PREFILLED_SYRINGE | INTRAVENOUS | Status: AC
  Filled 2020-01-05: qty 10

## 2020-01-05 MED ORDER — ACETAMINOPHEN 325 MG PO TABS: 325.0000 mg | ORAL_TABLET | ORAL | Status: DC | PRN

## 2020-01-05 MED ORDER — DIPHENHYDRAMINE HCL 50 MG/ML IJ SOLN
12.5000 mg | INTRAMUSCULAR | Status: DC | PRN
  Administered 2020-01-05: 12.5 mg via INTRAVENOUS

## 2020-01-05 MED ORDER — NALBUPHINE HCL 10 MG/ML IJ SOLN: 5.0000 mg | INTRAMUSCULAR | Status: DC | PRN

## 2020-01-05 MED ORDER — OXYTOCIN-SODIUM CHLORIDE 30-0.9 UT/500ML-% IV SOLN
INTRAVENOUS | Status: DC | PRN
  Administered 2020-01-05: 300 mL via INTRAVENOUS

## 2020-01-05 MED ORDER — WITCH HAZEL-GLYCERIN EX PADS: 1.0000 "application " | MEDICATED_PAD | CUTANEOUS | Status: DC | PRN

## 2020-01-05 MED ORDER — DIPHENHYDRAMINE HCL 25 MG PO CAPS: 25.0000 mg | ORAL_CAPSULE | ORAL | Status: DC | PRN

## 2020-01-05 MED ORDER — MORPHINE SULFATE (PF) 0.5 MG/ML IJ SOLN
INTRAMUSCULAR | Status: AC
  Filled 2020-01-05: qty 10

## 2020-01-05 MED ORDER — SIMETHICONE 80 MG PO CHEW: 80.0000 mg | CHEWABLE_TABLET | ORAL | Status: DC | PRN

## 2020-01-05 MED ORDER — SCOPOLAMINE 1 MG/3DAYS TD PT72
1.0000 | MEDICATED_PATCH | Freq: Once | TRANSDERMAL | Status: DC
  Administered 2020-01-05: 1.5 mg via TRANSDERMAL

## 2020-01-05 MED ORDER — SOD CITRATE-CITRIC ACID 500-334 MG/5ML PO SOLN
30.0000 mL | Freq: Once | ORAL | Status: AC
  Administered 2020-01-05: 30 mL via ORAL
  Filled 2020-01-05: qty 15

## 2020-01-05 MED ORDER — PRENATAL MULTIVITAMIN CH
1.0000 | ORAL_TABLET | Freq: Every day | ORAL | Status: DC
  Administered 2020-01-06 – 2020-01-07 (×2): 1 via ORAL
  Filled 2020-01-05 (×2): qty 1

## 2020-01-05 MED ORDER — LACTATED RINGERS IV BOLUS
1000.0000 mL | Freq: Once | INTRAVENOUS | Status: AC
  Administered 2020-01-05: 1000 mL via INTRAVENOUS

## 2020-01-05 MED ORDER — ACETAMINOPHEN 160 MG/5ML PO SOLN: 325.0000 mg | ORAL | Status: DC | PRN

## 2020-01-05 MED ORDER — SENNOSIDES-DOCUSATE SODIUM 8.6-50 MG PO TABS
2.0000 | ORAL_TABLET | ORAL | Status: DC
  Administered 2020-01-06 (×2): 2 via ORAL
  Filled 2020-01-05 (×2): qty 2

## 2020-01-05 MED ORDER — NALOXONE HCL 4 MG/10ML IJ SOLN
1.0000 ug/kg/h | INTRAVENOUS | Status: DC | PRN
  Filled 2020-01-05: qty 5

## 2020-01-05 MED ORDER — ONDANSETRON HCL 4 MG/2ML IJ SOLN
INTRAMUSCULAR | Status: DC | PRN
  Administered 2020-01-05: 4 mg via INTRAVENOUS

## 2020-01-05 MED ORDER — SIMETHICONE 80 MG PO CHEW
80.0000 mg | CHEWABLE_TABLET | Freq: Three times a day (TID) | ORAL | Status: DC
  Administered 2020-01-05 – 2020-01-07 (×5): 80 mg via ORAL
  Filled 2020-01-05 (×5): qty 1

## 2020-01-05 MED ORDER — NALOXONE HCL 0.4 MG/ML IJ SOLN: 0.4000 mg | INTRAMUSCULAR | Status: DC | PRN

## 2020-01-05 MED ORDER — ONDANSETRON HCL 4 MG/2ML IJ SOLN: 4.0000 mg | Freq: Once | INTRAMUSCULAR | Status: DC | PRN

## 2020-01-05 MED ORDER — ENOXAPARIN SODIUM 80 MG/0.8ML ~~LOC~~ SOLN
80.0000 mg | SUBCUTANEOUS | Status: DC
  Administered 2020-01-06 – 2020-01-07 (×2): 80 mg via SUBCUTANEOUS
  Filled 2020-01-05 (×2): qty 0.8

## 2020-01-05 MED ORDER — ONDANSETRON HCL 4 MG/2ML IJ SOLN
INTRAMUSCULAR | Status: AC
  Filled 2020-01-05: qty 2

## 2020-01-05 MED ORDER — NALBUPHINE HCL 10 MG/ML IJ SOLN
INTRAMUSCULAR | Status: AC
  Filled 2020-01-05: qty 1

## 2020-01-05 MED ORDER — MENTHOL 3 MG MT LOZG: 1.0000 | LOZENGE | OROMUCOSAL | Status: DC | PRN

## 2020-01-05 MED ORDER — OXYCODONE HCL 5 MG PO TABS
5.0000 mg | ORAL_TABLET | ORAL | Status: DC | PRN
  Administered 2020-01-06 (×3): 5 mg via ORAL
  Filled 2020-01-05 (×3): qty 1

## 2020-01-05 MED ORDER — OXYCODONE HCL 5 MG/5ML PO SOLN: 5.0000 mg | Freq: Once | ORAL | Status: DC | PRN

## 2020-01-05 MED ORDER — NALBUPHINE HCL 10 MG/ML IJ SOLN
5.0000 mg | Freq: Once | INTRAMUSCULAR | Status: AC | PRN
  Administered 2020-01-05: 5 mg via INTRAVENOUS

## 2020-01-05 MED ORDER — DEXTROSE 5 % IV SOLN: 3.0000 g | INTRAVENOUS | Status: DC

## 2020-01-05 MED ORDER — MORPHINE SULFATE (PF) 0.5 MG/ML IJ SOLN
INTRAMUSCULAR | Status: DC | PRN
Start: 2020-01-05 — End: 2020-01-05
  Administered 2020-01-05: 150 ug via INTRATHECAL

## 2020-01-05 MED ORDER — OXYCODONE HCL 5 MG PO TABS: 5.0000 mg | ORAL_TABLET | Freq: Once | ORAL | Status: DC | PRN

## 2020-01-05 MED ORDER — MEPERIDINE HCL 25 MG/ML IJ SOLN: 6.2500 mg | INTRAMUSCULAR | Status: DC | PRN

## 2020-01-05 MED ORDER — DEXAMETHASONE SODIUM PHOSPHATE 4 MG/ML IJ SOLN
INTRAMUSCULAR | Status: DC | PRN
  Administered 2020-01-05: 4 mg via INTRAVENOUS

## 2020-01-05 MED ORDER — NALBUPHINE HCL 10 MG/ML IJ SOLN: 5.0000 mg | Freq: Once | INTRAMUSCULAR | Status: AC | PRN

## 2020-01-05 MED ORDER — TETANUS-DIPHTH-ACELL PERTUSSIS 5-2.5-18.5 LF-MCG/0.5 IM SUSP: 0.5000 mL | Freq: Once | INTRAMUSCULAR | Status: DC

## 2020-01-05 MED ORDER — SODIUM CHLORIDE 0.9% FLUSH: 3.0000 mL | INTRAVENOUS | Status: DC | PRN

## 2020-01-05 MED ORDER — ACETAMINOPHEN 500 MG PO TABS
1000.0000 mg | ORAL_TABLET | Freq: Four times a day (QID) | ORAL | Status: DC
  Administered 2020-01-05 – 2020-01-07 (×8): 1000 mg via ORAL
  Filled 2020-01-05 (×8): qty 2

## 2020-01-05 MED ORDER — SCOPOLAMINE 1 MG/3DAYS TD PT72
MEDICATED_PATCH | TRANSDERMAL | Status: AC
  Filled 2020-01-05: qty 1

## 2020-01-05 MED ORDER — ONDANSETRON HCL 4 MG/2ML IJ SOLN: 4.0000 mg | Freq: Three times a day (TID) | INTRAMUSCULAR | Status: DC | PRN

## 2020-01-05 MED ORDER — DEXAMETHASONE SODIUM PHOSPHATE 4 MG/ML IJ SOLN
INTRAMUSCULAR | Status: AC
  Filled 2020-01-05: qty 2

## 2020-01-05 MED ORDER — KETOROLAC TROMETHAMINE 30 MG/ML IJ SOLN
30.0000 mg | Freq: Four times a day (QID) | INTRAMUSCULAR | Status: AC | PRN
  Administered 2020-01-05 – 2020-01-06 (×2): 30 mg via INTRAVENOUS
  Filled 2020-01-05: qty 1

## 2020-01-05 MED ORDER — COCONUT OIL OIL
1.0000 "application " | TOPICAL_OIL | Status: DC | PRN
  Administered 2020-01-07: 1 via TOPICAL

## 2020-01-05 MED ORDER — DIPHENHYDRAMINE HCL 25 MG PO CAPS: 25.0000 mg | ORAL_CAPSULE | Freq: Four times a day (QID) | ORAL | Status: DC | PRN

## 2020-01-05 MED ORDER — OXYTOCIN-SODIUM CHLORIDE 30-0.9 UT/500ML-% IV SOLN
INTRAVENOUS | Status: AC
  Filled 2020-01-05: qty 500

## 2020-01-05 MED ORDER — DIBUCAINE (PERIANAL) 1 % EX OINT: 1.0000 "application " | TOPICAL_OINTMENT | CUTANEOUS | Status: DC | PRN

## 2020-01-05 MED ORDER — BUPIVACAINE IN DEXTROSE 0.75-8.25 % IT SOLN
INTRATHECAL | Status: DC | PRN
  Administered 2020-01-05: 1.6 mL via INTRATHECAL

## 2020-01-05 MED ORDER — FENTANYL CITRATE (PF) 100 MCG/2ML IJ SOLN: 25.0000 ug | INTRAMUSCULAR | Status: DC | PRN

## 2020-01-05 MED ORDER — FENTANYL CITRATE (PF) 100 MCG/2ML IJ SOLN
INTRAMUSCULAR | Status: AC
  Filled 2020-01-05: qty 2

## 2020-01-05 MED ORDER — ZOLPIDEM TARTRATE 5 MG PO TABS: 5.0000 mg | ORAL_TABLET | Freq: Every evening | ORAL | Status: DC | PRN

## 2020-01-05 MED ORDER — PHENYLEPHRINE HCL (PRESSORS) 10 MG/ML IV SOLN
INTRAVENOUS | Status: DC | PRN
  Administered 2020-01-05: 80 ug via INTRAVENOUS

## 2020-01-05 MED ORDER — SIMETHICONE 80 MG PO CHEW
80.0000 mg | CHEWABLE_TABLET | ORAL | Status: DC
  Administered 2020-01-06 (×2): 80 mg via ORAL
  Filled 2020-01-05 (×2): qty 1

## 2020-01-05 MED ORDER — DEXTROSE 5 % IV SOLN
INTRAVENOUS | Status: DC | PRN
  Administered 2020-01-05: 3 g via INTRAVENOUS

## 2020-01-05 MED ORDER — IBUPROFEN 800 MG PO TABS
800.0000 mg | ORAL_TABLET | Freq: Three times a day (TID) | ORAL | Status: DC
  Administered 2020-01-05 – 2020-01-07 (×6): 800 mg via ORAL
  Filled 2020-01-05 (×6): qty 1

## 2020-01-05 MED ORDER — DEXTROSE 5 % IV SOLN
INTRAVENOUS | Status: AC
  Filled 2020-01-05: qty 3000

## 2020-01-05 MED ORDER — KETOROLAC TROMETHAMINE 30 MG/ML IJ SOLN
INTRAMUSCULAR | Status: AC
  Filled 2020-01-05: qty 1

## 2020-01-05 MED ORDER — METOCLOPRAMIDE HCL 5 MG/ML IJ SOLN
INTRAMUSCULAR | Status: AC
  Filled 2020-01-05: qty 2

## 2020-01-05 MED ORDER — OXYTOCIN-SODIUM CHLORIDE 30-0.9 UT/500ML-% IV SOLN: 2.5000 [IU]/h | INTRAVENOUS | Status: AC

## 2020-01-05 SURGICAL SUPPLY — 31 items
BENZOIN TINCTURE PRP APPL 2/3 (GAUZE/BANDAGES/DRESSINGS) ×3 IMPLANT
CLIP FILSHIE TUBAL LIGA STRL (Clip) ×3 IMPLANT
CLOSURE WOUND 1/2 X4 (GAUZE/BANDAGES/DRESSINGS) ×1
CLOTH BEACON ORANGE TIMEOUT ST (SAFETY) ×3 IMPLANT
DRSG OPSITE POSTOP 4X10 (GAUZE/BANDAGES/DRESSINGS) ×3 IMPLANT
ELECT REM PT RETURN 9FT ADLT (ELECTROSURGICAL) ×3
ELECTRODE REM PT RTRN 9FT ADLT (ELECTROSURGICAL) ×1 IMPLANT
EXTRACTOR VACUUM KIWI (MISCELLANEOUS) IMPLANT
GLOVE BIOGEL PI IND STRL 7.0 (GLOVE) ×1 IMPLANT
GLOVE BIOGEL PI INDICATOR 7.0 (GLOVE) ×2
GLOVE SURG ORTHO 8.0 STRL STRW (GLOVE) ×3 IMPLANT
GOWN STRL REUS W/TWL LRG LVL3 (GOWN DISPOSABLE) ×6 IMPLANT
KIT ABG SYR 3ML LUER SLIP (SYRINGE) IMPLANT
NEEDLE HYPO 25X5/8 SAFETYGLIDE (NEEDLE) IMPLANT
NS IRRIG 1000ML POUR BTL (IV SOLUTION) ×3 IMPLANT
PACK C SECTION WH (CUSTOM PROCEDURE TRAY) ×3 IMPLANT
PAD OB MATERNITY 4.3X12.25 (PERSONAL CARE ITEMS) ×3 IMPLANT
PENCIL SMOKE EVAC W/HOLSTER (ELECTROSURGICAL) ×3 IMPLANT
RTRCTR C-SECT PINK 25CM LRG (MISCELLANEOUS) IMPLANT
SPONGE LAP 18X18 X RAY DECT (DISPOSABLE) ×6 IMPLANT
STRIP CLOSURE SKIN 1/2X4 (GAUZE/BANDAGES/DRESSINGS) ×2 IMPLANT
SUT MON AB-0 CT1 36 (SUTURE) ×6 IMPLANT
SUT PLAIN 0 NONE (SUTURE) IMPLANT
SUT VIC AB 0 CT1 27 (SUTURE) ×6
SUT VIC AB 0 CT1 27XBRD ANBCTR (SUTURE) ×3 IMPLANT
SUT VIC AB 2-0 CT1 27 (SUTURE) ×2
SUT VIC AB 2-0 CT1 TAPERPNT 27 (SUTURE) ×1 IMPLANT
SUT VIC AB 4-0 KS 27 (SUTURE) ×3 IMPLANT
TOWEL OR 17X24 6PK STRL BLUE (TOWEL DISPOSABLE) ×3 IMPLANT
TRAY FOLEY W/BAG SLVR 14FR LF (SET/KITS/TRAYS/PACK) ×3 IMPLANT
WATER STERILE IRR 1000ML POUR (IV SOLUTION) ×3 IMPLANT

## 2020-01-05 NOTE — Progress Notes (Signed)
Pt transported to OR via stretcher  

## 2020-01-05 NOTE — Transfer of Care (Signed)
Immediate Anesthesia Transfer of Care Note  Patient: Veronica Boyle  Procedure(s) Performed: CESAREAN SECTION (N/A )  Patient Location: PACU  Anesthesia Type:Spinal  Level of Consciousness: awake, alert  and oriented  Airway & Oxygen Therapy: Patient Spontanous Breathing  Post-op Assessment: Report given to RN and Post -op Vital signs reviewed and stable  Post vital signs: Reviewed and stable  Last Vitals:  Vitals Value Taken Time  BP    Temp    Pulse    Resp    SpO2      Last Pain:  Vitals:   01/05/20 1232  TempSrc: Oral  PainSc:          Complications: No complications documented.

## 2020-01-05 NOTE — Anesthesia Preprocedure Evaluation (Signed)
Anesthesia Evaluation  Patient identified by MRN, date of birth, ID band Patient awake    Reviewed: Allergy & Precautions, H&P , NPO status , Patient's Chart, lab work & pertinent test results  Airway Mallampati: I  TM Distance: >3 FB Neck ROM: Full    Dental no notable dental hx. (+) Teeth Intact, Dental Advisory Given   Pulmonary shortness of breath,    Pulmonary exam normal breath sounds clear to auscultation       Cardiovascular hypertension, + dysrhythmias Supra Ventricular Tachycardia  Rhythm:Regular Rate:Normal     Neuro/Psych PSYCHIATRIC DISORDERS Anxiety  Neuromuscular disease    GI/Hepatic negative GI ROS, Neg liver ROS,   Endo/Other  Morbid obesity  Renal/GU negative Renal ROS     Musculoskeletal negative musculoskeletal ROS (+)   Abdominal   Peds  Hematology negative hematology ROS (+)   Anesthesia Other Findings   Reproductive/Obstetrics (+) Pregnancy                             Anesthesia Physical  Anesthesia Plan  ASA: III and emergent  Anesthesia Plan: Spinal   Post-op Pain Management:    Induction:   PONV Risk Score and Plan: 2  Airway Management Planned: Natural Airway  Additional Equipment:   Intra-op Plan:   Post-operative Plan:   Informed Consent: I have reviewed the patients History and Physical, chart, labs and discussed the procedure including the risks, benefits and alternatives for the proposed anesthesia with the patient or authorized representative who has indicated his/her understanding and acceptance.       Plan Discussed with: Anesthesiologist  Anesthesia Plan Comments: (  )        Anesthesia Quick Evaluation

## 2020-01-05 NOTE — Anesthesia Procedure Notes (Signed)
Spinal  Patient location during procedure: OR Start time: 01/05/2020 2:15 PM End time: 01/05/2020 2:20 PM Staffing Anesthesiologist: Bethena Midget, MD Preanesthetic Checklist Completed: patient identified, IV checked, site marked, risks and benefits discussed, surgical consent, monitors and equipment checked, pre-op evaluation and timeout performed Spinal Block Patient position: sitting Prep: DuraPrep Patient monitoring: heart rate, cardiac monitor, continuous pulse ox and blood pressure Approach: midline Location: L3-4 Injection technique: single-shot Needle Needle type: Sprotte  Needle gauge: 24 G Needle length: 9 cm Assessment Sensory level: T4

## 2020-01-05 NOTE — Op Note (Signed)
Veronica Boyle   PROCEDURE DATE: 01/05/2020  PREOPERATIVE DIAGNOSES: Intrauterine pregnancy at 100w1d weeks gestation; Elective Repeat Cesarean, Desire for Permanent Sterilization  POSTOPERATIVE DIAGNOSES: The same  PROCEDURE: Repeat Low Transverse Cesarean Section with Postpartum Bilateral Tubal Ligation with Filshie Clips  SURGEON:  Dr. Scheryl Darter   Dr. Lynnda Shields  ANESTHESIOLOGY TEAM: Anesthesiologist: Bethena Midget, MD CRNA: Graciela Husbands, CRNA  INDICATIONS: Veronica Boyle is a 29 y.o. [phone] at [redacted]w[redacted]d here for cesarean section secondary to the indications listed under preoperative diagnoses; please see preoperative note for further details. The risks of cesarean section were discussed with the patient including but were not limited to: bleeding which may require transfusion or reoperation; infection which may require antibiotics; injury to bowel, bladder, ureters or other surrounding organs; injury to the fetus; need for additional procedures including hysterectomy in the event of a life-threatening hemorrhage; placental abnormalities wth subsequent pregnancies, incisional problems, thromboembolic phenomenon and other postoperative/anesthesia complications.  Patient also desires permanent sterilization.  Other reversible forms of contraception were discussed with patient; she declines all other modalities. Risks of procedure discussed with patient including but not limited to: risk of regret, permanence of method, bleeding, infection, injury to surrounding organs and need for additional procedures.  Failure risk of about 1% with increased risk of ectopic gestation if pregnancy occurs was also discussed with patient.  Also discussed possibility of post-tubal pain syndrome. The patient concurred with the proposed plan, giving informed written consent for the procedures.  Patient has been NPO since 01/04/20; she will remain NPO for procedure. Anesthesia and OR aware.  Preoperative prophylactic  antibiotics and SCDs ordered on call to the OR.  To OR when ready.  FINDINGS:  Viable female infant in cephalic presentation.  Apgars 9 and 9.  Clear amniotic fluid.  Intact placenta, three vessel cord.  Normal uterus, fallopian tubes and ovaries bilaterally.  ANESTHESIA: Spinal INTRAVENOUS FLUIDS: 2,000 ml   ESTIMATED BLOOD LOSS: 643 ml URINE OUTPUT:  200 ml SPECIMENS: Placenta sent to L&D COMPLICATIONS: None immediate  PROCEDURE IN DETAIL:  The patient preoperatively received intravenous antibiotics and had sequential compression devices applied to her lower extremities.  She was then taken to the operating room where spinal anesthesia was administeredand was found to be adequate. She was then placed in a dorsal supine position with a leftward tilt, and prepped and draped in a sterile manner.  A foley catheter was placed into her bladder and attached to constant gravity.  After an adequate timeout was performed, a Pfannenstiel skin incision was made with scalpel on her preexisting scar and carried through to the underlying layer of fascia. The fascia was incised in the midline, and this incision was extended bilaterally using the Mayo scissors.  Kocher clamps were applied to the superior aspect of the fascial incision and the underlying rectus muscles were dissected off bluntly and sharply. Moderate adhesions were taken down sharply and bluntly. A similar process was carried out on the inferior aspect of the fascial incision. The rectus muscles were separated in the midline and the peritoneum was entered bluntly. The Alexis self-retaining retractor was introduced into the abdominal cavity.  Attention was turned to the lower uterine segment where a low transverse hysterotomy was made with a scalpel and extended bilaterally bluntly.  The infant was successfully delivered with vacuum assistance (1 pop-off), the cord was clamped and cut after one minute, and the infant was handed over to the awaiting  neonatology team. Uterine massage was then administered,  and the placenta delivered intact with a three-vessel cord. The uterus was then cleared of clots and debris.  The hysterotomy was closed with 0 Vicryl in a running locked fashion, and an imbricating layer was also placed with 0 Vicryl.  The pelvis was cleared of all clot and debris. Hemostasis was confirmed on all surfaces.    A moist lap pad was used to move omentum and bowel away until the left fallopian tube was identified and grasped with a Babcock clamp, and followed out to the fimbriated end. The left Fallopian tube was identified by tracing out to the fimbraie, grasped with the Babcock clamps. An avascular midsection of the tube approximately 3-4cm from the cornua was grasped with the babcock clamps and the filshie clip was applied, taking care to incorporate the entire tube.  Attention was then turned to the right fallopian tube after confirmation by tracing the tube out to the fimbriae. The same procedure was then performed on the right Fallopian tube, with excellent hemostasis was noted from both BTL sites. Good hemostasis was noted overall.  The retractor was removed.  The peritoneum was closed with a 0 Vicryl running stitch and the rectus muscles were reapproximated using 0 Vicryl interrupted stitches. The fascia was then closed using 0 Vicryl in a running fashion.  The subcutaneous layer was irrigated and reapproximated with 2-0 plain gut interrupted stitches, and the skin was closed with a 4-0 Vicryl subcuticular stitch. The patient tolerated the procedure well. Sponge, instrument and needle counts were correct x 3.  She was taken to the recovery room in stable condition.   Sheila Oats MD 01/05/2020 4:12 PM

## 2020-01-05 NOTE — MAU Note (Signed)
.   Veronica Boyle is a 29 y.o. at [redacted]w[redacted]d here in MAU reporting: SROM at 0330 clear fluid. Occ ctx. No VB. Endorses good fetal movement. Patient for repeat c/s. NPO since 1930 yesterday. Had beef stirfry for dinner.   Pain score: 6 Vitals:   01/05/20 0553  BP: (!) 143/68  Pulse: (!) 111  Resp: 16  Temp: 99 F (37.2 C)  SpO2: 100%     FHT:143

## 2020-01-05 NOTE — H&P (Signed)
OBSTETRIC ADMISSION HISTORY AND PHYSICAL  Veronica Boyle is a 29 y.o. female G3P2002 with IUP at 68w1dby 11wk u/s presenting for SROM (9/24 _0 ). She reports +FMs, no VB, no blurry vision, headaches or peripheral edema, and RUQ pain.  She plans on breast feeding. She request BTL for birth control (papers signed 10/03/19). [facility]  Dating: By 11wk u/s --->  Estimated Date of Delivery: 01/18/20  Sono:    12/29/19_1 , CWD, normal anatomy, cephalic presentation, 33710G 65% EFW   Prenatal History/Complications:  gHTN (likely cHTN-not on meds) Morbid obesity (BMI 56) Unwanted fertility (desires BTL) Prior c/s x2 Paroxysmal SVT (nml echo/holter monitor) Pre-diabetes (prior to pregnancy, passed early hgb a1c and 2hr gtt)  Past Medical History: Past Medical History:  Diagnosis Date  . Arrhythmia   . Atypical chest pain 11/15/2014   Seen by [facility] 2016 and stress echo done.   . Gestational hypertension   . Prediabetes   . SVT (supraventricular tachycardia) (HCC)     Past Surgical History: Past Surgical History:  Procedure Laterality Date  . CESAREAN SECTION    . CESAREAN SECTION N/A 01/25/2014   Procedure: CESAREAN SECTION;  Surgeon: JJonnie Kind MD;  [facility];  Service: Obstetrics;  Laterality: N/A;  . TONSILLECTOMY    . WISDOM TOOTH EXTRACTION  2012    Obstetrical History: OB History    Gravida  3   Para  2   Term  2   Preterm  0   AB  0   Living  2     SAB  0   TAB  0   Ectopic  0   Multiple  0   Live Births  2           Social History Social History   Socioeconomic History  . Marital status: Single    Spouse name: Not on file  . Number of children: Not on file  . Years of education: Not on file  . Highest education level: Not on file  Occupational History  . Not on file  Tobacco Use  . Smoking status: Never Smoker  . Smokeless tobacco: Never Used  Vaping Use  . Vaping Use: Never used  Substance  and Sexual Activity  . Alcohol use: No  . Drug use: No  . Sexual activity: Yes    Birth control/protection: None  Other Topics Concern  . Not on file  Social History Narrative  . Not on file   Social Determinants of Health   Financial Resource Strain:   . Difficulty of Paying Living Expenses: Not on file  Food Insecurity: No Food Insecurity  . Worried About RCharity fundraiserin the Last Year: Never true  . Ran Out of Food in the Last Year: Never true  Transportation Needs: No Transportation Needs  . Lack of Transportation (Medical): No  . Lack of Transportation (Non-Medical): No  Physical Activity:   . Days of Exercise per Week: Not on file  . Minutes of Exercise per Session: Not on file  Stress:   . Feeling of Stress : Not on file  Social Connections:   . Frequency of Communication with Friends and Family: Not on file  . Frequency of Social Gatherings with Friends and Family: Not on file  . Attends Religious Services: Not on file  . Active Member of Clubs or Organizations: Not on file  . Attends CArchivistMeetings: Not on file  .  Marital Status: Not on file    Family History: Family History  Problem Relation Age of Onset  . Hyperlipidemia Mother   . Hypertension Mother   . Diabetes Mother   . Asthma Father   . Asthma Sister   . Asthma Brother   . Birth defects Daughter        right renal duplicated collecting system    Allergies: Allergies  Allergen Reactions  . Vancomycin Other (See Comments)    Reaction:  Red man syndrome    Medications Prior to Admission  Medication Sig Dispense Refill Last Dose  . acetaminophen (TYLENOL) 500 MG tablet Take 1,000 mg by mouth every 6 (six) hours as needed for headache.    01/04/2020 at Unknown time  . aspirin EC 81 MG tablet Take 1 tablet (81 mg total) by mouth daily. 60 tablet 2 Past Week at Unknown time  . Prenatal Vit-Fe Fumarate-FA (PREPLUS) 27-1 MG TABS Take 1 tablet by mouth daily. 90 tablet 3 01/04/2020  at Unknown time  . Blood Pressure Monitoring (BLOOD PRESSURE KIT) DEVI 1 Device by Does not apply route as needed. 1 each 0   . cetirizine (ZYRTEC) 10 MG tablet Take 10 mg by mouth daily.   More than a month at Unknown time  . Elastic Bandages & Supports (COMFORT FIT MATERNITY SUPP LG) MISC 1 Units by Does not apply route daily as needed. 1 each 0   . ferrous sulfate 325 (65 FE) MG EC tablet Take 1 tablet (325 mg total) by mouth daily with breakfast. (Patient not taking: Reported on 12/22/2019) 30 tablet 6      Review of Systems   All systems reviewed and negative except as stated in HPI  Blood pressure (!) 110/54, pulse (!) 105, temperature 99 F (37.2 C), resp. rate 16, height _0  (1.651 m), weight (!) 152.3 kg, SpO2 100 %. General appearance: alert, cooperative and no distress Lungs: normal respiratory effort Heart: regular rate and rhythm Abdomen: soft, non-tender; gravid Pelvic: as noted below Extremities: Homans sign is negative, no sign of DVT Presentation: cephalicBSUS Fetal monitoringBaseline: 150 bpm, Variability: Good {> 6 bpm), Accelerations: Reactive and Decelerations: Absent Uterine activityFrequency: Every 1-3 minutes     Prenatal labs: ABO, Rh: O/Positive/-- (04/26 1228) Antibody: Negative (04/26 1228) Rubella: 2.11 (04/26 1228) RPR: Non Reactive (06/22 0914)  HBsAg: Negative (04/26 1228)  HIV: Non Reactive (06/22 0914)  GBS: Negative/-- (09/10 1152)  2 hr Glucola passed Genetic screening  normal Anatomy US normal  Prenatal Transfer Tool  Maternal Diabetes: No Genetic Screening: Normal Maternal Ultrasounds/Referrals: Normal Fetal Ultrasounds or other Referrals:  Fetal echo Maternal Substance Abuse:  No Significant Maternal Medications:  None Significant Maternal Lab Results: None  Results for orders placed or performed during the hospital encounter of 01/05/20 (from the past 24 hour(s))  POCT fern test   Collection Time: 01/05/20  5:58 AM  Result  Value Ref Range   POCT Fern Test Positive = ruptured amniotic [place]es     Patient Active Problem List   Diagnosis Date Noted  . Prediabetes 10/17/2019  . Tachycardia 10/06/2019  . Shortness of breath 10/06/2019  . Numbness and tingling in both hands 10/06/2019  . Unwanted fertility 10/03/2019  . Low vitamin D level 09/14/2019  . BMI 50.0-59.9, adult ([place]) 08/07/2019  . Obesity in pregnancy 08/07/2019  . Sciatic leg pain 08/07/2019  . Supervision of high risk pregnancy, antepartum 07/31/2019  . History of gestational hypertension 07/31/2019  . Paroxysmal supraventricular tachycardia (  [place]) 11/15/2014  . Palpitations 11/15/2014  . Anxiety 11/15/2014  . 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 History of fetal abnormality in previous pregnancy, currently pregnant 08/16/2013  . Previous cesarean delivery affecting pregnancy, antepartum 08/16/2013    Assessment/Plan:  Veronica Boyle is a 29 y.o. G3P2002 at 60w1dhere for SROM (9/24 _0 ) who will be scheduled for rLTCS (2 prior c/s).  #rLTCS with BTL The risks of cesarean section were discussed with the patient including but were not limited to: bleeding which may require transfusion or reoperation; infection which may require antibiotics; injury to bowel, bladder, ureters or other surrounding organs; injury to the fetus; need for additional procedures including hysterectomy in the event of a life-threatening hemorrhage; placental abnormalities wth subsequent pregnancies, incisional problems, thromboembolic phenomenon and other postoperative/anesthesia complications.  Patient also desires permanent sterilization.  Other reversible forms of contraception were discussed with patient; she declines all other modalities. Risks of procedure discussed with patient including but not limited to: risk of regret, permanence of method, bleeding, infection, injury to surrounding organs and need for additional procedures.  Failure risk of about 1% with increased  risk of ectopic gestation if pregnancy occurs was also discussed with patient.  Also discussed possibility of post-tubal pain syndrome. The patient concurred with the proposed plan, giving informed written consent for the procedures.  Patient has been NPO since 1930 on 9/23 she will remain NPO for procedure. Anesthesia and OR aware.  Preoperative prophylactic antibiotics and SCDs ordered on call to the OR.  To OR when ready.  Patient desires PP Liletta if BTL cannot be performed due to body habitus. Discussed risks/benefits of PPIUD placement, patient amendable.   #Pain: spinal #FWB: Cat 1 #ID: GBS neg, Ancef intraop #MOF: breast #MOC:BTL #Circ:  No #gHTN: likely cHTN, given elevated BP at early gestation. Currently asymptomatic. preE labs 10/31/19 nml, repeat labs pending. Elevated BP, mild range since admission.  AArrie Senate MD  01/05/2020, 6:39 AM

## 2020-01-05 NOTE — Discharge Summary (Addendum)
Postpartum Discharge Summary  01/07/20     Patient Name: Veronica Boyle DOB: [birth year] MRN: [id number]  Date of admission: 01/05/2020 Delivery date:01/05/2020  Delivering provider: Woodroe Mode  Date of discharge: 01/07/2020  Admitting diagnosis: Cesarean delivery delivered [O82] Intrauterine pregnancy: [redacted]w[redacted]d    Secondary diagnosis:  Principal Problem:   Cesarean delivery delivered Active Problems:   History of fetal abnormality in previous pregnancy, currently pregnant   Previous cesarean delivery affecting pregnancy, antepartum   Paroxysmal supraventricular tachycardia ([facility]   History of gestational hypertension   BMI 50.0-59.9, adult ([facility]   Obesity in pregnancy   Prediabetes   History of bilateral tubal ligation   Preeclampsia  Additional problems: as noted above Discharge diagnosis: Cesarean delivery delivered                              Post partum procedures:postpartum tubal ligation Augmentation: N/A Complications: None  Hospital course: Onset of Labor With Unplanned C/S   29y.o. yo G3P3003 at 359w1das admitted in Latent Labor s/p SROM on 01/05/2020. The patient went for cesarean section due to Elective Repeat. Delivery details as follows: Membrane Rupture Time/Date: 3:30 AM ,01/05/2020   Delivery Method:C-Section, Vacuum Assisted  Details of operation can be found in separate operative note. Patient had an uncomplicated postpartum course.  She is ambulating,tolerating a regular diet, passing flatus, and urinating well.  Patient is discharged home in stable condition 01/07/20.  Newborn Data: Birth date:01/05/2020  Birth time:2:53 PM  Gender:Female  Living status:Living  Apgars:9 ,9  Weight:3060 g   Magnesium Sulfate received: No BMZ received: No Rhophylac:N/A MMR:N/A T-DaP:Given prenatally Flu: No Transfusion:No  Physical exam  Vitals:   01/06/20 0907 01/06/20 1400 01/06/20 2054 01/07/20 0544  BP: (!) 117/58 131/68 (!) 129/56 (!) 113/54  Pulse:  69 66 99 73  Resp: _0 Temp: 97.9 F (36.6 C) 98.2 F (36.8 C) 98 F (36.7 C) 98.6 F (37 C)  TempSrc: Oral Oral Oral Oral  SpO2: 98%  99% 100%  Weight:      Height:       General: alert, cooperative and no distress Lochia: appropriate Uterine Fundus: firm Incision: Healing well with no significant drainage DVT Evaluation: No evidence of DVT seen on physical exam. No cords or calf tenderness. Labs: Lab Results  Component Value Date   WBC 10.9 (H) 01/06/2020   HGB 9.1 (L) 01/06/2020   HCT 27.5 (L) 01/06/2020   MCV 90.8 01/06/2020   PLT 173 01/06/2020   CMP Latest Ref Rng & Units 01/05/2020  Glucose 70 - 99 mg/dL 88  BUN 6 - 20 mg/dL 5(L)  Creatinine 0.44 - 1.00 mg/dL 0.49  Sodium 135 - 145 mmol/L 133(L)  Potassium 3.5 - 5.1 mmol/L 3.8  Chloride 98 - 111 mmol/L 106  CO2 22 - 32 mmol/L 17(L)  Calcium 8.9 - 10.3 mg/dL 8.2(L)  Total Protein 6.5 - 8.1 g/dL 5.5(L)  Total Bilirubin 0.3 - 1.2 mg/dL 0.4  Alkaline Phos 38 - 126 U/L 64  AST 15 - 41 U/L 32  ALT 0 - 44 U/L 18   Edinburgh Score: Edinburgh Postnatal Depression Scale Screening Tool 01/07/2020  I have been able to laugh and see the funny side of things. (No Data)     After visit meds:  Allergies as of 01/07/2020      Reactions   Vancomycin Other (See Comments)  Reaction:  Red man syndrome      Medication List    STOP taking these medications   aspirin EC 81 MG tablet   Comfort Fit Maternity Supp Lg Misc     TAKE these medications   acetaminophen 500 MG tablet Commonly known as: TYLENOL Take 1,000 mg by mouth every 6 (six) hours as needed for headache.   ascorbic acid 250 MG tablet Commonly known as: VITAMIN C Take 1 tablet (250 mg total) by mouth every other day. Take with iron. Start taking on: January 08, 2020   Blood Pressure Kit Devi 1 Device by Does not apply route as needed.   cetirizine 10 MG tablet Commonly known as: ZYRTEC Take 10 mg by mouth daily.   ferrous sulfate 325  (65 FE) MG EC tablet Take 1 tablet (325 mg total) by mouth every other day. What changed: when to take this   ibuprofen 600 MG tablet Commonly known as: ADVIL Take 1 tablet (600 mg total) by mouth every 6 (six) hours as needed.   oxyCODONE 5 MG immediate release tablet Commonly known as: Oxy IR/ROXICODONE Take 1-2 tablets (5-10 mg total) by mouth every 4 (four) hours as needed for moderate pain.   PrePLUS 27-1 MG Tabs Take 1 tablet by mouth daily.        Discharge home in stable condition Infant Feeding: Breast Infant Disposition:home with mother Discharge instruction: per After Visit Summary and Postpartum booklet. Activity: Advance as tolerated. Pelvic rest for 6 weeks.  Diet: routine diet Future Appointments: Future Appointments  Date Time Provider [place]  01/09/2020  8:55 AM Sloan Leiter, MD [facility]  01/09/2020 10:15 AM [facility] NURSE [facility]  01/09/2020 10:30 AM [facility] US3 [facility]  03/04/2020  9:00 AM Tobb, Godfrey Pick, DO [facility] None   Follow up Visit: Message sent to [facility] on 01/05/20 to schedule PP appt  Please schedule this patient for a In person postpartum visit in 4 weeks with the following provider: Any provider. Additional Postpartum F/U:2 hour GTT in 4-6 weeks, Incision check 1 week and BP check 1 week  High risk pregnancy complicated by: Preeclampsia w/o SF, Prediabetes (early A1c) Delivery mode:  C-Section, Vacuum Assisted  Anticipated Birth Control:  BTL done PP   01/07/2020 Sharion Settler, DO  GME ATTESTATION:  I saw and evaluated the patient. I agree with the findings and the plan of care as documented in the resident's note.  Arrie Senate, MD OB Fellow, [place] for [place] 01/07/2020 11:48 PM

## 2020-01-06 LAB — CBC
HCT: 27.5 % — ABNORMAL LOW (ref 36.0–46.0)
Hemoglobin: 9.1 g/dL — ABNORMAL LOW (ref 12.0–15.0)
MCH: 30 pg (ref 26.0–34.0)
MCHC: 33.1 g/dL (ref 30.0–36.0)
MCV: 90.8 fL (ref 80.0–100.0)
Platelets: 173 10*3/uL (ref 150–400)
RBC: 3.03 MIL/uL — ABNORMAL LOW (ref 3.87–5.11)
RDW: 12.7 % (ref 11.5–15.5)
WBC: 10.9 10*3/uL — ABNORMAL HIGH (ref 4.0–10.5)
nRBC: 0 % (ref 0.0–0.2)

## 2020-01-06 MED ORDER — VITAMIN C 250 MG PO TABS
250.0000 mg | ORAL_TABLET | ORAL | Status: DC
  Administered 2020-01-06: 250 mg via ORAL
  Filled 2020-01-06: qty 1

## 2020-01-06 MED ORDER — FERROUS SULFATE 325 (65 FE) MG PO TABS
325.0000 mg | ORAL_TABLET | ORAL | Status: DC
  Administered 2020-01-06: 325 mg via ORAL
  Filled 2020-01-06: qty 1

## 2020-01-06 NOTE — Progress Notes (Signed)
Subjective: POD#1: Cesarean Delivery/BTL ([place]) Patient reports   Patient is doing well without complaints. Ambulating without difficulty. Has not spontaneously voided, catheter in place. Passing flatus. Tolerating PO. Abdominal pain improved. Vaginal bleeding decreased.  Objective: Vital signs in last 24 hours: Temp:  [97.9 F (36.6 C)-99.1 F (37.3 C)] 98.3 F (36.8 C) (09/25 0520) Pulse Rate:  [64-106] 66 (09/25 0520) Resp:  [15-20] 18 (09/25 0520) BP: (104-144)/(53-78) 104/59 (09/25 0520) SpO2:  [97 %-100 %] 99 % (09/25 0520)  Physical Exam:  General: alert, cooperative and no distress Lochia: appropriate Uterine Fundus: firm Incision: dressing c/d/i DVT Evaluation: No evidence of DVT seen on physical exam.  Recent Labs    01/05/20 0609 01/06/20 0418  HGB 10.9* 9.1*  HCT 34.4* 27.5*    Assessment/Plan: POD#1 rLTCS (PROM)/BTL ([place])  -doing well without complaints  -monitor for void after foley discontinued  -breastfeeding, awaiting supply  #Acute blood loss anemia  -hgb 10.9>>9.1  -start PO ferrous sulfate/vit c every other day  #gHTN w/ SIPE  -BP well controlled since delivery  -asymptomatic  -continue to monitor  Plan for discharge tomorrow.  Alric Seton 01/06/2020, 5:56 AM

## 2020-01-06 NOTE — Anesthesia Postprocedure Evaluation (Signed)
Anesthesia Post Note  Patient: Veronica Boyle  Procedure(s) Performed: CESAREAN SECTION (N/A )     Patient location during evaluation: PACU Anesthesia Type: Spinal Level of consciousness: oriented and awake and alert Pain management: pain level controlled Vital Signs Assessment: post-procedure vital signs reviewed and stable Respiratory status: spontaneous breathing, respiratory function stable and patient connected to nasal cannula oxygen Cardiovascular status: blood pressure returned to baseline and stable Postop Assessment: no headache, no backache and no apparent nausea or vomiting Anesthetic complications: no   No complications documented.  Last Vitals:  Vitals:   01/06/20 0203 01/06/20 0520  BP:  (!) 104/59  Pulse:  66  Resp: 18 18  Temp:  36.8 C  SpO2:  99%    Last Pain:  Vitals:   01/06/20 0520  TempSrc: Oral  PainSc: 0-No pain   Pain Goal:                   Dineen Conradt

## 2020-01-06 NOTE — Progress Notes (Signed)
CSW received consult for hx of Anxiety and Depression.  CSW met with MOB at bedside to offer support and complete assessment. On arrival, CSW introduced self and stated visit purpose.  MGM and infant Micah were present, however, after PPD/A and SIDS education, MGM stepped out of room to offer MOB privacy during assessment. MOB and MGM were pleasant and engaged during visit.   CSW provided education regarding the baby blues period vs. perinatal mood disorders, discussed treatment and gave resources for mental health follow up if concerns arise.  CSW recommends self-evaluation during the postpartum time period using the New Mom Checklist from Postpartum Progress and encouraged MOB and MGM to contact a medical professional if symptoms are noted at any time. MOB and MGM stated understanding and denied any questions. MOB denied any postpartum depression or anxiety hx.    CSW provided review of Sudden Infant Death Syndrome (SIDS) precautions. MOB stated understanding and denied any questions. MOB confirmed having all needed items for baby including car seat and crib for baby's safe transport and sleep.   During assessment, MOB confirmed hx of depression and anxiety. MOB stated depression was at it worse around the age of 29 y/o. MOB reported going to therapy and taking medication during that time. MOB explained sx have been controlled most recently by faith and prayer. MOB identified additional coping skills; sitting outside in the fresh air and using items such as erases to ground self. MOB denied any SI, HI, or domestic violence. MOB identified FOB, mom, brother, and sister as support. MOB stated she may consider reaching out to therapist on list provided to occasional overeating. MOB declined any additional resources at this time.   CSW identifies no further need for intervention and no barriers to discharge at this time.  Shyloh Krinke D. Lissa Morales, MSW, [place] Clinical Social Worker [phone]

## 2020-01-06 NOTE — Lactation Note (Signed)
This note was copied from a baby's chart. Lactation Consultation Note  Patient Name: Veronica Boyle [id number] Date: 01/06/2020  P3, 29 hour ETI female infant. Per RN, mom declined LC services at this time.   Maternal Data    Feeding Feeding Type: Breast Fed  LATCH Score                   Interventions    Lactation Tools Discussed/Used     Consult Status      Danelle Earthly 01/06/2020, 8:18 PM

## 2020-01-07 MED ORDER — IBUPROFEN 600 MG PO TABS
600.0000 mg | ORAL_TABLET | Freq: Four times a day (QID) | ORAL | 0 refills | Status: DC | PRN
Start: 2020-01-07 — End: 2020-06-27

## 2020-01-07 MED ORDER — ASCORBIC ACID 250 MG PO TABS
250.0000 mg | ORAL_TABLET | ORAL | 0 refills | Status: DC
Start: 2020-01-08 — End: 2020-06-27

## 2020-01-07 MED ORDER — OXYCODONE HCL 5 MG PO TABS
5.0000 mg | ORAL_TABLET | ORAL | 0 refills | Status: DC | PRN
Start: 2020-01-07 — End: 2020-06-27

## 2020-01-07 MED ORDER — FERROUS SULFATE 325 (65 FE) MG PO TBEC
325.0000 mg | DELAYED_RELEASE_TABLET | ORAL | 6 refills | Status: DC
Start: 2020-01-07 — End: 2020-06-27

## 2020-01-07 NOTE — Discharge Instructions (Signed)
-continue prenatal vitamins while breastfeeding -iron and vitamin c every other day -tylenol 1000 mg every 6 hours alternating with ibuprofen 600 mg every 6 hours for pain control, okay to use oxycodone for breakthrough pain  Postpartum Care After Cesarean Delivery This sheet gives you information about how to care for yourself from the time you deliver your baby to up to 6-12 weeks after delivery (postpartum period). Your health care provider may also give you more specific instructions. If you have problems or questions, contact your health care provider. Follow these instructions at home: Medicines  Take over-the-counter and prescription medicines only as told by your health care provider.  If you were prescribed an antibiotic medicine, take it as told by your health care provider. Do not stop taking the antibiotic even if you start to feel better.  Ask your health care provider if the medicine prescribed to you: ? Requires you to avoid driving or using heavy machinery. ? Can cause constipation. You may need to take actions to prevent or treat constipation, such as:  Drink enough fluid to keep your urine pale yellow.  Take over-the-counter or prescription medicines.  Eat foods that are high in fiber, such as beans, whole grains, and fresh fruits and vegetables.  Limit foods that are high in fat and processed sugars, such as fried or sweet foods. Activity  Gradually return to your normal activities as told by your health care provider.  Avoid activities that take a lot of effort and energy (are strenuous) until approved by your health care provider. Walking at a slow to moderate pace is usually safe. Ask your health care provider what activities are safe for you. ? Do not lift anything that is heavier than your baby or 10 lb (4.5 kg) as told by your health care provider. ? Do not vacuum, climb stairs, or drive a car for as long as told by your health care provider.  If possible,  have someone help you at home until you are able to do your usual activities yourself.  Rest as much as possible. Try to rest or take naps while your baby is sleeping. Vaginal bleeding  It is normal to have vaginal bleeding (lochia) after delivery. Wear a sanitary pad to absorb vaginal bleeding and discharge. ? During the first week after delivery, the amount and appearance of lochia is often similar to a menstrual period. ? Over the next few weeks, it will gradually decrease to a dry, yellow-brown discharge. ? For most women, lochia stops completely by 4-6 weeks after delivery. Vaginal bleeding can vary from woman to woman.  Change your sanitary pads frequently. Watch for any changes in your flow, such as: ? A sudden increase in volume. ? A change in color. ? Large blood clots.  If you pass a blood clot, save it and call your health care provider to discuss. Do not flush blood clots down the toilet before you get instructions from your health care provider.  Do not use tampons or douches until your health care provider says this is safe.  If you are not breastfeeding, your period should return 6-8 weeks after delivery. If you are breastfeeding, your period may return anytime between 8 weeks after delivery and the time that you stop breastfeeding. Perineal care   If your C-section (Cesarean section) was unplanned, and you were allowed to labor and push before delivery, you may have pain, swelling, and discomfort of the tissue between your vaginal opening and your anus (perineum). You  may also have an incision in the tissue (episiotomy) or the tissue may have torn during delivery. Follow these instructions as told by your health care provider: ? Keep your perineum clean and dry as told by your health care provider. Use medicated pads and pain-relieving sprays and creams as directed. ? If you have an episiotomy or vaginal tear, check the area every day for signs of infection. Check  for:  Redness, swelling, or pain.  Fluid or blood.  Warmth.  Pus or a bad smell. ? You may be given a squirt bottle to use instead of wiping to clean the perineum area after you go to the bathroom. As you start healing, you may use the squirt bottle before wiping yourself. Make sure to wipe gently. ? To relieve pain caused by an episiotomy, vaginal tear, or hemorrhoids, try taking a warm sitz bath 2-3 times a day. A sitz bath is a warm water bath that is taken while you are sitting down. The water should only come up to your hips and should cover your buttocks. Breast care  Within the first few days after delivery, your breasts may feel heavy, full, and uncomfortable (breast engorgement). You may also have milk leaking from your breasts. Your health care provider can suggest ways to help relieve breast discomfort. Breast engorgement should go away within a few days.  If you are breastfeeding: ? Wear a bra that supports your breasts and fits you well. ? Keep your nipples clean and dry. Apply creams and ointments as told by your health care provider. ? You may need to use breast pads to absorb milk leakage. ? You may have uterine contractions every time you breastfeed for several weeks after delivery. Uterine contractions help your uterus return to its normal size. ? If you have any problems with breastfeeding, work with your health care provider or a Science writer.  If you are not breastfeeding: ? Avoid touching your breasts as this can make your breasts produce more milk. ? Wear a well-fitting bra and use cold packs to help with swelling. ? Do not squeeze out (express) milk. This causes you to make more milk. Intimacy and sexuality  Ask your health care provider when you can engage in sexual activity. This may depend on your: ? Risk of infection. ? Healing rate. ? Comfort and desire to engage in sexual activity.  You are able to get pregnant after delivery, even if you have  not had your period. If desired, talk with your health care provider about methods of family planning or birth control (contraception). Lifestyle  Do not use any products that contain nicotine or tobacco, such as cigarettes, e-cigarettes, and chewing tobacco. If you need help quitting, ask your health care provider.  Do not drink alcohol, especially if you are breastfeeding. Eating and drinking   Drink enough fluid to keep your urine pale yellow.  Eat high-fiber foods every day. These may help prevent or relieve constipation. High-fiber foods include: ? Whole grain cereals and breads. ? Brown rice. ? Beans. ? Fresh fruits and vegetables.  Take your prenatal vitamins until your postpartum checkup or until your health care provider tells you it is okay to stop. General instructions  Keep all follow-up visits for you and your baby as told by your health care provider. Most women visit their health care provider for a postpartum checkup within the first 3-6 weeks after delivery. Contact a health care provider if you:  Feel unable to cope with the  changes that a new baby brings to your life, and these feelings do not go away.  Feel unusually sad or worried.  Have breasts that are painful, hard, or turn red.  Have a fever.  Have trouble holding urine or keeping urine from leaking.  Have little or no interest in activities you used to enjoy.  Have not breastfed at all and you have not had a menstrual period for 12 weeks after delivery.  Have stopped breastfeeding and you have not had a menstrual period for 12 weeks after you stopped breastfeeding.  Have questions about caring for yourself or your baby.  Pass a blood clot from your vagina. Get help right away if you:  Have chest pain.  Have difficulty breathing.  Have sudden, severe leg pain.  Have severe pain or cramping in your abdomen.  Bleed from your vagina so much that you fill more than one sanitary pad in one hour.  Bleeding should not be heavier than your heaviest period.  Develop a severe headache.  Faint.  Have blurred vision or spots in your vision.  Have a bad-smelling vaginal discharge.  Have thoughts about hurting yourself or your baby. If you ever feel like you may hurt yourself or others, or have thoughts about taking your own life, get help right away. You can go to your nearest emergency department or call:  Your local emergency services (911 in the [place]  A suicide crisis helpline, such as the [place] at 404-603-1100. This is open 24 hours a day. Summary  The period of time from when you deliver your baby to up to 6-12 weeks after delivery is called the postpartum period.  Gradually return to your normal activities as told by your health care provider.  Keep all follow-up visits for you and your baby as told by your health care provider. This information is not intended to replace advice given to you by your health care provider. Make sure you discuss any questions you have with your health care provider. Document Revised: 11/17/2017 Document Reviewed: 11/17/2017 Elsevier Patient Education  [place].

## 2020-01-09 ENCOUNTER — Ambulatory Visit: Payer: Medicaid Other

## 2020-01-09 ENCOUNTER — Encounter: Payer: Self-pay | Admitting: Obstetrics and Gynecology

## 2020-01-11 ENCOUNTER — Other Ambulatory Visit (HOSPITAL_COMMUNITY): Admission: RE | Admit: 2020-01-11 | Payer: Medicaid Other | Source: Ambulatory Visit

## 2020-01-11 ENCOUNTER — Other Ambulatory Visit (INDEPENDENT_AMBULATORY_CARE_PROVIDER_SITE_OTHER): Payer: Medicaid Other | Admitting: Obstetrics and Gynecology

## 2020-01-11 NOTE — Progress Notes (Signed)
c section orders placed

## 2020-01-13 ENCOUNTER — Inpatient Hospital Stay (HOSPITAL_COMMUNITY)
Admission: RE | Admit: 2020-01-13 | Payer: Medicaid Other | Source: Home / Self Care | Admitting: Obstetrics and Gynecology

## 2020-01-15 ENCOUNTER — Other Ambulatory Visit: Payer: Self-pay

## 2020-01-15 ENCOUNTER — Ambulatory Visit (INDEPENDENT_AMBULATORY_CARE_PROVIDER_SITE_OTHER): Payer: Medicaid Other | Admitting: *Deleted

## 2020-01-15 VITALS — BP 105/65 | HR 92 | Ht 65.0 in | Wt 309.9 lb

## 2020-01-15 DIAGNOSIS — B379 Candidiasis, unspecified: Secondary | ICD-10-CM

## 2020-01-15 MED ORDER — FLUCONAZOLE 150 MG PO TABS: 150.0000 mg | ORAL_TABLET | Freq: Once | ORAL | 0 refills | Status: AC

## 2020-01-15 NOTE — Progress Notes (Signed)
Here for bp and wound check.  Had repeat c/s and btl on 01/05/20. Also had hx  Preeclampsia. States noticed a rash 10/2 in folds of thigh / under abd roll. She started using lotrimin cream.  BP wnl. Dr Debroah Loop in to see wound. Area around incision and in folds of groin reddened. Incision CDI .  Advised continue Lotrimin and also RX for Diflucan sent in. Advised patient to keep clean and dry and call us if worsening of redness, wound opening, etc. Advised to keep pp appointment. She voice understanding Draxton Luu,RN

## 2020-02-07 ENCOUNTER — Ambulatory Visit (INDEPENDENT_AMBULATORY_CARE_PROVIDER_SITE_OTHER): Payer: Medicaid Other | Admitting: Obstetrics and Gynecology

## 2020-02-07 ENCOUNTER — Encounter: Payer: Self-pay | Admitting: Family Medicine

## 2020-02-07 ENCOUNTER — Encounter: Payer: Self-pay | Admitting: Obstetrics and Gynecology

## 2020-02-07 ENCOUNTER — Other Ambulatory Visit: Payer: Self-pay

## 2020-02-07 ENCOUNTER — Other Ambulatory Visit (HOSPITAL_COMMUNITY)
Admission: RE | Admit: 2020-02-07 | Discharge: 2020-02-07 | Disposition: A | Discharge status: Home / Self Care | Payer: Medicaid Other | Source: Ambulatory Visit | Attending: Obstetrics and Gynecology | Admitting: Obstetrics and Gynecology

## 2020-02-07 VITALS — BP 122/76 | HR 86 | Wt 311.6 lb

## 2020-02-07 DIAGNOSIS — K5900 Constipation, unspecified: Secondary | ICD-10-CM

## 2020-02-07 DIAGNOSIS — Z124 Encounter for screening for malignant neoplasm of cervix: Secondary | ICD-10-CM | POA: Insufficient documentation

## 2020-02-07 DIAGNOSIS — O99893 Other specified diseases and conditions complicating puerperium: Secondary | ICD-10-CM

## 2020-02-07 DIAGNOSIS — B372 Candidiasis of skin and nail: Secondary | ICD-10-CM

## 2020-02-07 NOTE — Patient Instructions (Signed)
Metamucil or Miralax 2-3 times per day until your regular and then do daily.

## 2020-02-07 NOTE — Progress Notes (Signed)
    Post Partum Visit Note  Veronica Boyle is a 29 y.o. G3P3003 s/p 9/24 rLTCS and BTL at 38wks due to SROM.  Anesthesia: spinal. Postpartum course has been uncomplicated. Baby is doing well. Baby is feeding by breast. Bleeding no bleeding. Bowel function is abnormal: severe constipation. Bladder function is normal. Patient is not sexually active. Postpartum depression screening: negative.   The pregnancy intention screening data noted above was reviewed. Potential methods of contraception were discussed.    Edinburgh Postnatal Depression Scale - 02/07/20 1338      Edinburgh Postnatal Depression Scale:  In the Past 7 Days   I have been able to laugh and see the funny side of things. 0    I have looked forward with enjoyment to things. 0    I have blamed myself unnecessarily when things went wrong. 0    I have been anxious or worried for no good reason. 0    I have felt scared or panicky for no good reason. 0    Things have been getting on top of me. 0    I have been so unhappy that I have had difficulty sleeping. 0    I have felt sad or miserable. 0    I have been so unhappy that I have been crying. 0    The thought of harming myself has occurred to me. 0    Edinburgh Postnatal Depression Scale Total 0            The following portions of the patient's history were reviewed and updated as appropriate: allergies, current medications, past family history, past medical history, past social history, past surgical history and problem list.  Review of Systems Pertinent items are noted in HPI.    Objective:  Blood pressure 122/76, pulse 86, weight (!) 311 lb 9.6 oz (141.3 kg), currently breastfeeding. NAD Abdomen: obese, soft, nttp. Nd. Incision well healed. It is in the pannus and looks to have some slight redness c/w yeast on the lateral edges EGBUS, vagina and cervix: normal No hemorrhoids noted Assessment:    Normal postpartum exam. Pap smear done at today's visit.   Plan:    Patient doing well. Follow up pap smear. Okay to resume sexual activity. Recommend keeping incision clean and dry and using some OTC anti-fungal, bid washing, powder and cloth to help with moisture control.   2-3x/day miralax or metamucil until regular and then qday recommended.   D/w her need for PCP and recommendations given.    [place] Bing, MD [facility] for [employer], [facility]

## 2020-02-08 LAB — CYTOLOGY - PAP: Diagnosis: NEGATIVE

## 2020-02-20 ENCOUNTER — Other Ambulatory Visit: Payer: Self-pay

## 2020-02-20 DIAGNOSIS — R7303 Prediabetes: Secondary | ICD-10-CM

## 2020-02-23 ENCOUNTER — Other Ambulatory Visit: Payer: Medicaid Other

## 2020-02-26 ENCOUNTER — Other Ambulatory Visit: Payer: Medicaid Other

## 2020-02-26 ENCOUNTER — Other Ambulatory Visit: Payer: Self-pay

## 2020-02-27 LAB — GLUCOSE TOLERANCE, 2 HOURS
Glucose, 2 hour: 92 mg/dL (ref 65–139)
Glucose, GTT - Fasting: 84 mg/dL (ref 65–99)

## 2020-02-29 ENCOUNTER — Other Ambulatory Visit: Payer: Self-pay

## 2020-02-29 DIAGNOSIS — O139 Gestational [pregnancy-induced] hypertension without significant proteinuria, unspecified trimester: Secondary | ICD-10-CM | POA: Insufficient documentation

## 2020-02-29 DIAGNOSIS — I499 Cardiac arrhythmia, unspecified: Secondary | ICD-10-CM | POA: Insufficient documentation

## 2020-02-29 DIAGNOSIS — I471 Supraventricular tachycardia: Secondary | ICD-10-CM | POA: Insufficient documentation

## 2020-03-04 ENCOUNTER — Telehealth: Payer: Medicaid Other | Admitting: Cardiology

## 2020-03-05 ENCOUNTER — Encounter: Payer: Self-pay | Admitting: Cardiology

## 2020-03-05 ENCOUNTER — Telehealth: Payer: Medicaid Other | Admitting: Cardiology

## 2020-03-05 ENCOUNTER — Other Ambulatory Visit: Payer: Self-pay

## 2020-03-19 ENCOUNTER — Encounter: Payer: Self-pay | Admitting: General Practice

## 2020-03-25 ENCOUNTER — Encounter: Payer: Self-pay | Admitting: Cardiology

## 2020-03-25 ENCOUNTER — Ambulatory Visit (INDEPENDENT_AMBULATORY_CARE_PROVIDER_SITE_OTHER): Payer: Medicaid Other | Admitting: Cardiology

## 2020-03-25 ENCOUNTER — Other Ambulatory Visit: Payer: Self-pay

## 2020-03-25 VITALS — BP 108/68 | HR 69 | Ht 65.0 in | Wt 314.6 lb

## 2020-03-25 DIAGNOSIS — E8881 Metabolic syndrome: Secondary | ICD-10-CM

## 2020-03-25 DIAGNOSIS — I10 Essential (primary) hypertension: Secondary | ICD-10-CM

## 2020-03-25 HISTORY — DX: Essential (primary) hypertension: I10

## 2020-03-25 HISTORY — DX: Metabolic syndrome: E88.810

## 2020-03-25 HISTORY — DX: Metabolic syndrome: E88.81

## 2020-03-25 HISTORY — DX: Morbid (severe) obesity due to excess calories: E66.01

## 2020-03-25 LAB — CBC WITH DIFFERENTIAL/PLATELET
Basophils Absolute: 0 10*3/uL (ref 0.0–0.2)
Basos: 0 %
EOS (ABSOLUTE): 0.2 10*3/uL (ref 0.0–0.4)
Eos: 4 %
Hematocrit: 36.1 % (ref 34.0–46.6)
Hemoglobin: 11.8 g/dL (ref 11.1–15.9)
Immature Grans (Abs): 0 10*3/uL (ref 0.0–0.1)
Immature Granulocytes: 0 %
Lymphocytes Absolute: 1.5 10*3/uL (ref 0.7–3.1)
Lymphs: 32 %
MCH: 27.7 pg (ref 26.6–33.0)
MCHC: 32.7 g/dL (ref 31.5–35.7)
MCV: 85 fL (ref 79–97)
Monocytes Absolute: 0.4 10*3/uL (ref 0.1–0.9)
Monocytes: 8 %
Neutrophils Absolute: 2.7 10*3/uL (ref 1.4–7.0)
Neutrophils: 56 %
Platelets: 286 10*3/uL (ref 150–450)
RBC: 4.26 x10E6/uL (ref 3.77–5.28)
RDW: 13 % (ref 11.7–15.4)
WBC: 4.8 10*3/uL (ref 3.4–10.8)

## 2020-03-25 LAB — BASIC METABOLIC PANEL
BUN/Creatinine Ratio: 12 (ref 9–23)
BUN: 7 mg/dL (ref 6–20)
CO2: 22 mmol/L (ref 20–29)
Calcium: 8.8 mg/dL (ref 8.7–10.2)
Chloride: 104 mmol/L (ref 96–106)
Creatinine, Ser: 0.58 mg/dL (ref 0.57–1.00)
GFR calc Af Amer: 146 mL/min/{1.73_m2} (ref >59)
GFR calc non Af Amer: 127 mL/min/{1.73_m2} (ref >59)
Glucose: 93 mg/dL (ref 65–99)
Potassium: 4.3 mmol/L (ref 3.5–5.2)
Sodium: 136 mmol/L (ref 134–144)

## 2020-03-25 LAB — MAGNESIUM: Magnesium: 1.7 mg/dL (ref 1.6–2.3)

## 2020-03-25 NOTE — Progress Notes (Signed)
Cardiology Office Note:    Date:  03/25/2020   ID:  Veronica Boyle, DOB 06/20/1990, MRN [id number]  PCP:  Patient, No Pcp Per  Cardiologist:  Berniece Salines, DO  Electrophysiologist:  None   Referring MD: No ref. provider found   " I am doing well"  History of Present Illness:    Veronica Boyle is a 29 y.o. female with a hx of female with a hx of prediabetes, obesity, previous history of gestational hypertension, history of preeclampsia, anxiety and paroxysmal supraventricular tachycardia. She is postpartum and underwent her C-section on January 13, 2020 at 38 weeks.  She has been doing well postpartum.  Her blood pressure has been within acceptable range she tells me.  She has a blood pressure cuff at home and has been taking it.  She denies any depression or blues and says she has great support. No other complaints at this time.  Past Medical History:  Diagnosis Date  . Arrhythmia   . Atypical chest pain 11/15/2014   [facility]en by [facility] 2016 and stress echo done.   . Gestational hypertension   . History of fetal abnormality in previous pregnancy, currently pregnant 08/16/2013   No issues with 2nd baby See 08/24/2013 genetics note  States first baby had "two uteruses and 3 kidneys".  Delivered at [facility]. C/S for FTD and NRFHR. Takes baby to [facility] regularly  . History of gestational hypertension 07/31/2019  . Palpitations 11/15/2014  . Paroxysmal supraventricular tachycardia ([place]) 11/15/2014   Seen by [facility] 11/2019. Normal echo, EF 10/2019. No e/o SVT on holter. RTC 82month. Pt on no meds currently    . Prediabetes   . Preeclampsia 01/05/2020   Elevated UP:C  . 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 SVT (supraventricular tachycardia) (HCC)     Past Surgical History:  Procedure Laterality Date  . CESAREAN SECTION    . CESAREAN SECTION N/A 01/25/2014   Procedure: CESAREAN SECTION;  Surgeon: JJonnie Kind MD;  [facility];  Service: Obstetrics;  Laterality: N/A;  . CESAREAN SECTION N/A  01/05/2020   Procedure: CESAREAN SECTION;  Surgeon: AWoodroe Mode MD;  Location: [facility];  Service: Obstetrics;  Laterality: N/A;  . TONSILLECTOMY    . WISDOM TOOTH EXTRACTION  2012    Current Medications: Current Meds  Medication Sig  . acetaminophen (TYLENOL) 500 MG tablet Take 1,000 mg by mouth every 6 (six) hours as needed for headache.   .Marland Kitchenaspirin EC 81 MG tablet Take 81 mg by mouth daily. Swallow whole.  . Blood Pressure Monitoring (BLOOD PRESSURE KIT) DEVI 1 Device by Does not apply route as needed.  . cetirizine (ZYRTEC) 10 MG tablet Take 10 mg by mouth daily.  . clotrimazole (LOTRIMIN) 1 % cream Apply 1 application topically 2 (two) times daily.  . Ferrous Sulfate (IRON) 325 (65 Fe) MG TABS Take 1 tablet by mouth every other day.  . ferrous sulfate 325 (65 FE) MG EC tablet Take 1 tablet (325 mg total) by mouth every other day.  . fluconazole (DIFLUCAN) 150 MG tablet Take 150 mg by mouth once.  .Marland Kitchenibuprofen (ADVIL) 600 MG tablet Take 1 tablet (600 mg total) by mouth every 6 (six) hours as needed.  .Marland KitchenoxyCODONE (OXY IR/ROXICODONE) 5 MG immediate release tablet Take 1-2 tablets (5-10 mg total) by mouth every 4 (four) hours as needed for moderate pain.  . Prenatal Vit-Fe Fumarate-FA (PREPLUS) 27-1 MG TABS Take 1 tablet by mouth daily.  . vitamin C (  VITAMIN C) 250 MG tablet Take 1 tablet (250 mg total) by mouth every other day. Take with iron.     Allergies:   Vancomycin   Social History   Socioeconomic History  . Marital status: Single    Spouse name: Not on file  . Number of children: Not on file  . Years of education: Not on file  . Highest education level: Not on file  Occupational History  . Not on file  Tobacco Use  . Smoking status: Never Smoker  . Smokeless tobacco: Never Used  Vaping Use  . Vaping Use: Never used  Substance and Sexual Activity  . Alcohol use: No  . Drug use: No  . Sexual activity: Yes    Birth control/protection: None  Other Topics Concern   . Not on file  Social History Narrative  . Not on file   Social Determinants of Health   Financial Resource Strain: Not on file  Food Insecurity: No Food Insecurity  . Worried About Charity fundraiser in the Last Year: Never true  . Ran Out of Food in the Last Year: Never true  Transportation Needs: No Transportation Needs  . Lack of Transportation (Medical): No  . Lack of Transportation (Non-Medical): No  Physical Activity: Not on file  Stress: Not on file  Social Connections: Not on file     Family History: The patient's family history includes Asthma in her brother, father, and sister; Birth defects in her daughter; Diabetes in her mother; Hyperlipidemia in her mother; Hypertension in her mother.  ROS:   Review of Systems  Constitution: Negative for decreased appetite, fever and weight gain.  HENT: Negative for congestion, ear discharge, hoarse voice and sore throat.   Eyes: Negative for discharge, redness, vision loss in right eye and visual halos.  Cardiovascular: Negative for chest pain, dyspnea on exertion, leg swelling, orthopnea and palpitations.  Respiratory: Negative for cough, hemoptysis, shortness of breath and snoring.   Endocrine: Negative for heat intolerance and polyphagia.  Hematologic/Lymphatic: Negative for bleeding problem. Does not bruise/bleed easily.  Skin: Negative for flushing, nail changes, rash and suspicious lesions.  Musculoskeletal: Negative for arthritis, joint pain, muscle cramps, myalgias, neck pain and stiffness.  Gastrointestinal: Negative for abdominal pain, bowel incontinence, diarrhea and excessive appetite.  Genitourinary: Negative for decreased libido, genital sores and incomplete emptying.  Neurological: Negative for brief paralysis, focal weakness, headaches and loss of balance.  Psychiatric/Behavioral: Negative for altered mental status, depression and suicidal ideas.  Allergic/Immunologic: Negative for HIV exposure and persistent  infections.    EKGs/Labs/Other Studies Reviewed:    The following studies were reviewed today:   EKG: None today  Recent Labs: 10/17/2019: TSH 0.869 01/05/2020: ALT 18; BUN 5; Creatinine, Ser 0.49; Potassium 3.8; Sodium 133 01/06/2020: Hemoglobin 9.1; Platelets 173  Recent Lipid Panel    Component Value Date/Time   CHOL 159 01/19/2019 1157   TRIG 255 (H) 01/19/2019 1157   HDL 33 (L) 01/19/2019 1157   CHOLHDL 4.8 (H) 01/19/2019 1157   LDLCALC 84 01/19/2019 1157    Physical Exam:    VS:  BP 108/68   Pulse 69   Ht _0  (1.651 m)   Wt (!) 314 lb 9.6 oz (142.7 kg)   SpO2 98%   BMI 52.35 kg/m     Wt Readings from Last 3 Encounters:  03/25/20 (!) 314 lb 9.6 oz (142.7 kg)  02/07/20 (!) 311 lb 9.6 oz (141.3 kg)  01/15/20 (!) 309 lb 14.4 oz (  140.6 kg)     GEN: Well nourished, well developed in no acute distress HEENT: Normal NECK: No JVD; No carotid bruits LYMPHATICS: No lymphadenopathy CARDIAC: S1S2 noted,RRR, no murmurs, rubs, gallops RESPIRATORY:  Clear to auscultation without rales, wheezing or rhonchi  ABDOMEN: Soft, non-tender, non-distended, +bowel sounds, no guarding. EXTREMITIES: No edema, No cyanosis, no clubbing MUSCULOSKELETAL:  No deformity  SKIN: Warm and dry NEUROLOGIC:  Alert and oriented x 3, non-focal PSYCHIATRIC:  Normal affect, good insight  ASSESSMENT:    1. Hypertension, unspecified type   2. Morbid obesity ([place])   3. Metabolic syndrome   4. Postpartum care and examination    PLAN:     She appears to be doing well.  Thankfully her blood pressure has been.  But have asked the patient to continue to check her blood pressure at home.  We will keep a close monitor on the patient.  She has great support with childcare and also does have some time for herself. The patient understands the need to lose weight with diet and exercise. We have discussed specific strategies for this.  The patient is in agreement with the above plan. The patient left the  office in stable condition.  The patient will follow up in 3 months-virtual visit okay.   Medication Adjustments/Labs and Tests Ordered: Current medicines are reviewed at length with the patient today.  Concerns regarding medicines are outlined above.  Orders Placed This Encounter  Procedures  . Basic metabolic panel  . CBC with Differential/Platelet  . Magnesium   No orders of the defined types were placed in this encounter.   Patient Instructions  Medication Instructions:  No medication changes. *If you need a refill on your cardiac medications before your next appointment, please call your pharmacy*   Lab Work: Your physician recommends that you have labs done in the office today. Your test included  basic metabolic panel, complete blood count and magnesium.  If you have labs (blood work) drawn today and your tests are completely normal, you will receive your results only by: Marland Kitchen MyChart Message (if you have MyChart) OR . A paper copy in the mail If you have any lab test that is abnormal or we need to change your treatment, we will call you to review the results.   Testing/Procedures: None ordered   Follow-Up: At [facility], you and your health needs are our priority.  As part of our continuing mission to provide you with exceptional heart care, we have created designated Provider Care Teams.  These Care Teams include your primary Cardiologist (physician) and Advanced Practice Providers (APPs -  Physician Assistants and Nurse Practitioners) who all work together to provide you with the care you need, when you need it.  We recommend signing up for the patient portal called "MyChart".  Sign up information is provided on this After Visit Summary.  MyChart is used to connect with patients for Virtual Visits (Telemedicine).  Patients are able to view lab/test results, encounter notes, upcoming appointments, etc.  Non-urgent messages can be sent to your provider as well.   To  learn more about what you can do with MyChart, go to [email or web address].    Your next appointment:   3 month(s)  The format for your next appointment:   Virtual Visit   Provider:   Berniece Salines, DO   Other Instructions NA     Adopting a Healthy Lifestyle.  Know what a healthy weight is for you (roughly BMI <25) and aim  to maintain this   Aim for 7+ servings of fruits and vegetables daily   65-80+ fluid ounces of water or unsweet tea for healthy kidneys   Limit to max 1 drink of alcohol per day; avoid smoking/tobacco   Limit animal fats in diet for cholesterol and heart health - choose grass fed whenever available   Avoid highly processed foods, and foods high in saturated/trans fats   Aim for low stress - take time to unwind and care for your mental health   Aim for 150 min of moderate intensity exercise weekly for heart health, and weights twice weekly for bone health   Aim for 7-9 hours of sleep daily   When it comes to diets, agreement about the perfect plan isnt easy to find, even among the experts. Experts at the [employer] developed an idea known as the Healthy Eating Plate. Just imagine a plate divided into logical, healthy portions.   The emphasis is on diet quality:   Load up on vegetables and fruits - one-half of your plate: Aim for color and variety, and remember that potatoes dont count.   Go for whole grains - one-quarter of your plate: Whole wheat, barley, wheat berries, quinoa, oats, brown rice, and foods made with them. If you want pasta, go with whole wheat pasta.   Protein power - one-quarter of your plate: Fish, chicken, beans, and nuts are all healthy, versatile protein sources. Limit red meat.   The diet, however, does go beyond the plate, offering a few other suggestions.   Use healthy plant oils, such as olive, canola, soy, corn, sunflower and peanut. Check the labels, and avoid partially hydrogenated oil, which have  unhealthy trans fats.   If youre thirsty, drink water. Coffee and tea are good in moderation, but skip sugary drinks and limit milk and dairy products to one or two daily servings.   The type of carbohydrate in the diet is more important than the amount. Some sources of carbohydrates, such as vegetables, fruits, whole grains, and beans-are healthier than others.   Finally, stay active  Signed, Berniece Salines, DO  03/25/2020 9:48 AM    [place]

## 2020-03-25 NOTE — Patient Instructions (Signed)
Medication Instructions:  No medication changes. *If you need a refill on your cardiac medications before your next appointment, please call your pharmacy*   Lab Work: Your physician recommends that you have labs done in the office today. Your test included  basic metabolic panel, complete blood count and magnesium.  If you have labs (blood work) drawn today and your tests are completely normal, you will receive your results only by: Marland Kitchen MyChart Message (if you have MyChart) OR . A paper copy in the mail If you have any lab test that is abnormal or we need to change your treatment, we will call you to review the results.   Testing/Procedures: None ordered   Follow-Up: At [facility], you and your health needs are our priority.  As part of our continuing mission to provide you with exceptional heart care, we have created designated Provider Care Teams.  These Care Teams include your primary Cardiologist (physician) and Advanced Practice Providers (APPs -  Physician Assistants and Nurse Practitioners) who all work together to provide you with the care you need, when you need it.  We recommend signing up for the patient portal called "MyChart".  Sign up information is provided on this After Visit Summary.  MyChart is used to connect with patients for Virtual Visits (Telemedicine).  Patients are able to view lab/test results, encounter notes, upcoming appointments, etc.  Non-urgent messages can be sent to your provider as well.   To learn more about what you can do with MyChart, go to [email or web address].    Your next appointment:   3 month(s)  The format for your next appointment:   Virtual Visit   Provider:   Thomasene Ripple, DO   Other Instructions NA

## 2020-03-26 ENCOUNTER — Telehealth: Payer: Self-pay

## 2020-03-26 NOTE — Telephone Encounter (Signed)
-----   Message from Thomasene Ripple, DO sent at 03/26/2020  9:34 AM EST ----- Labs normal, hemoglobin is back to baseline

## 2020-03-26 NOTE — Telephone Encounter (Signed)
Tried calling patient. No answer and no voicemail set up for me to leave a message. 

## 2020-03-27 NOTE — Telephone Encounter (Signed)
Spoke with patient regarding results and recommendation.  Patient verbalizes understanding and is agreeable to plan of care. Advised patient to call back with any issues or concerns.  

## 2020-03-27 NOTE — Telephone Encounter (Signed)
Patient calling back for results

## 2020-06-24 ENCOUNTER — Other Ambulatory Visit: Payer: Self-pay

## 2020-06-27 ENCOUNTER — Ambulatory Visit (INDEPENDENT_AMBULATORY_CARE_PROVIDER_SITE_OTHER): Payer: Medicaid Other | Admitting: Cardiology

## 2020-06-27 ENCOUNTER — Other Ambulatory Visit: Payer: Self-pay

## 2020-06-27 VITALS — BP 130/76 | HR 78 | Ht 66.0 in | Wt 328.0 lb

## 2020-06-27 DIAGNOSIS — E8881 Metabolic syndrome: Secondary | ICD-10-CM | POA: Diagnosis not present

## 2020-06-27 DIAGNOSIS — R002 Palpitations: Secondary | ICD-10-CM

## 2020-06-27 DIAGNOSIS — Z8759 Personal history of other complications of pregnancy, childbirth and the puerperium: Secondary | ICD-10-CM | POA: Diagnosis not present

## 2020-06-27 DIAGNOSIS — Z8679 Personal history of other diseases of the circulatory system: Secondary | ICD-10-CM

## 2020-06-27 MED ORDER — PROPRANOLOL HCL 10 MG PO TABS: 10.0000 mg | ORAL_TABLET | Freq: Two times a day (BID) | ORAL | 1 refills | Status: DC

## 2020-06-27 NOTE — Progress Notes (Signed)
Cardiology Office Note:    Date:  06/27/2020   ID:  Veronica Boyle, DOB 1991-02-14, MRN [id number]  PCP:  Patient, No Pcp Per  Cardiologist:  Berniece Salines, DO  Electrophysiologist:  None   Referring MD: No ref. provider found   Chief Complaint  Patient presents with  . chest and L arm pain    History of Present Illness:    Veronica Boyle is a 30 y.o. female with a hx of history of gestational hypertension, history of preeclampsia, history anxiety and proximal SVT is here today for follow-up visit.  I did see the patient postpartum after her C-section on January 13, 2020.  At that time she appeared to be doing well from a cardiovascular standpoint.  Today she is here because she tells me she has been experiencing some palpitations and with that she gets short of breath with some chest tightness.  She is concerned therefore she requested to be seen.  Past Medical History:  Diagnosis Date  . Anxiety 11/15/2014  . Arrhythmia   . Atypical chest pain 11/15/2014   [facility] and stress echo done.   Marland Kitchen BMI 50.0-59.9, adult ([place]) 08/07/2019  . Gestational hypertension   . History of fetal abnormality in previous pregnancy, currently pregnant 08/16/2013   No issues with 2nd baby See 08/24/2013 genetics note  States first baby had "two uteruses and 3 kidneys".  Del[facility] at [facility]. C/S for FTD and NRFHR. Takes baby to [facility] regularly  . History of gestational hypertension 07/31/2019  . Hypertension 03/25/2020  . Low vitamin D level 09/14/2019  . Metabolic syndrome 80/99/8338  . Morbid obesity ([place]) 03/25/2020  . Palpitations 11/15/2014  . Paroxysmal supraventricular tachycardia ([place]) 11/15/2014   Seen by [facility] 11/2019. Normal echo, EF 10/2019. No e/o SVT on holter. RTC 34months. Pt on no meds currently    . Postpartum care and examination 03/25/2020  . Prediabetes   . Preeclampsia 01/05/2020   Elevated UP:C  . 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 Sciatic leg pain 08/07/2019  . SVT  (supraventricular tachycardia) ([facility])     Past Surgical History:  Procedure Laterality Date  . CESAREAN SECTION    . CESAREAN SECTION N/A 01/25/2014   Procedure: CESAREAN SECTION;  Surgeon: Jonnie Kind, MD;  Location: [facility];  Service: Obstetrics;  Laterality: N/A;  . CESAREAN SECTION N/A 01/05/2020   Procedure: CESAREAN SECTION;  Surgeon: Woodroe Mode, MD;  Location: [facility];  Service: Obstetrics;  Laterality: N/A;  . TONSILLECTOMY    . WISDOM TOOTH EXTRACTION  2012    Current Medications: Current Meds  Medication Sig  . Blood Pressure Monitoring (BLOOD PRESSURE KIT) DEVI 1 Device by Does not apply route as needed.  . cetirizine (ZYRTEC) 10 MG tablet Take 10 mg by mouth daily.  . Prenatal Vit-Fe Fumarate-FA (PREPLUS) 27-1 MG TABS Take 1 tablet by mouth daily.  . propranolol (INDERAL) 10 MG tablet Take 1 tablet (10 mg total) by mouth 2 (two) times daily.  . [DISCONTINUED] ibuprofen (ADVIL) 600 MG tablet Take 1 tablet (600 mg total) by mouth every 6 (six) hours as needed.  . [DISCONTINUED] vitamin C (VITAMIN C) 250 MG tablet Take 1 tablet (250 mg total) by mouth every other day. Take with iron.     Allergies:   Vancomycin   Social History   Socioeconomic History  . Marital status: Single    Spouse name: Not on file  . Number of children: Not on file  .  Years of education: Not on file  . Highest education level: Not on file  Occupational History  . Not on file  Tobacco Use  . Smoking status: Never Smoker  . Smokeless tobacco: Never Used  Vaping Use  . Vaping Use: Never used  Substance and Sexual Activity  . Alcohol use: No  . Drug use: No  . Sexual activity: Yes    Birth control/protection: None  Other Topics Concern  . Not on file  Social History Narrative  . Not on file   Social Determinants of Health   Financial Resource Strain: Not on file  Food Insecurity: No Food Insecurity  . Worried About Charity fundraiser in the Last Year: Never true  . Ran Out  of Food in the Last Year: Never true  Transportation Needs: No Transportation Needs  . Lack of Transportation (Medical): No  . Lack of Transportation (Non-Medical): No  Physical Activity: Not on file  Stress: Not on file  Social Connections: Not on file     Family History: The patient's family history includes Asthma in her brother, father, and sister; Birth defects in her daughter; Diabetes in her mother; Hyperlipidemia in her mother; Hypertension in her mother.  ROS:   Review of Systems  Constitution: Negative for decreased appetite, fever and weight gain.  HENT: Negative for congestion, ear discharge, hoarse voice and sore throat.   Eyes: Negative for discharge, redness, vision loss in right eye and visual halos.  Cardiovascular: Negative for chest pain, dyspnea on exertion, leg swelling, orthopnea and palpitations.  Respiratory: Negative for cough, hemoptysis, shortness of breath and snoring.   Endocrine: Negative for heat intolerance and polyphagia.  Hematologic/Lymphatic: Negative for bleeding problem. Does not bruise/bleed easily.  Skin: Negative for flushing, nail changes, rash and suspicious lesions.  Musculoskeletal: Negative for arthritis, joint pain, muscle cramps, myalgias, neck pain and stiffness.  Gastrointestinal: Negative for abdominal pain, bowel incontinence, diarrhea and excessive appetite.  Genitourinary: Negative for decreased libido, genital sores and incomplete emptying.  Neurological: Negative for brief paralysis, focal weakness, headaches and loss of balance.  Psychiatric/Behavioral: Negative for altered mental status, depression and suicidal ideas.  Allergic/Immunologic: Negative for HIV exposure and persistent infections.    EKGs/Labs/Other Studies Reviewed:    The following studies were reviewed today:   EKG: None today  ZIO monitor The patient wore the monitor for 7 days starting October 17, 2019. Indication: Palpitations The minimum heart rate was 58  bpm, maximum heart rate was 156  bpm, and average heart rate was 98 bpm. Predominant underlying rhythm was Sinus Rhythm.   Premature atrial complexes were rare less than 1%. Premature Ventricular complexes rare less than 1%.  No ventricular tachycardia, no pauses, No AV block, no supraventricular tachycardia and no atrial fibrillation present.  2 patient triggered events into diary events all associated with sinus rhythm.  Conclusion: Normal/unremarkable study.   Recent Labs: 10/17/2019: TSH 0.869 01/05/2020: ALT 18 03/25/2020: BUN 7; Creatinine, Ser 0.58; Hemoglobin 11.8; Magnesium 1.7; Platelets 286; Potassium 4.3; Sodium 136  Recent Lipid Panel    Component Value Date/Time   CHOL 159 01/19/2019 1157   TRIG 255 (H) 01/19/2019 1157   HDL 33 (L) 01/19/2019 1157   CHOLHDL 4.8 (H) 01/19/2019 1157   LDLCALC 84 01/19/2019 1157    Physical Exam:    VS:  BP 130/76 (BP Location: Right Arm, Patient Position: Sitting)   Pulse 78   Ht $R'5\' 6"'EY$  (1.676 m)   Wt (!) 328 lb (148.8  kg)   SpO2 94%   BMI 52.94 kg/m     Wt Readings from Last 3 Encounters:  06/27/20 (!) 328 lb (148.8 kg)  03/25/20 (!) 314 lb 9.6 oz (142.7 kg)  02/07/20 (!) 311 lb 9.6 oz (141.3 kg)     GEN: Well nourished, well developed in no acute distress HEENT: Normal NECK: No JVD; No carotid bruits LYMPHATICS: No lymphadenopathy CARDIAC: S1S2 noted,RRR, no murmurs, rubs, gallops RESPIRATORY:  Clear to auscultation without rales, wheezing or rhonchi  ABDOMEN: Soft, non-tender, non-distended, +bowel sounds, no guarding. EXTREMITIES: No edema, No cyanosis, no clubbing MUSCULOSKELETAL:  No deformity  SKIN: Warm and dry NEUROLOGIC:  Alert and oriented x 3, non-focal PSYCHIATRIC:  Normal affect, good insight  ASSESSMENT:    1. History of PSVT (paroxysmal supraventricular tachycardia)   2. History of gestational hypertension   3. Metabolic syndrome   4. Palpitations    PLAN:     1.  Has a history of  paroxysmal SVT and with her palpitation history anxiety like to start the patient on low-dose propanolol 10 mg twice a day.  She is agreeable to start this medication.  Educated her about this medication.  2.  She has gained weight since the last time she tells me that she is talking to primary care and they are going to help her with losing weight medically.  She also plans in the future to hopefully consider bariatric surgery.  The patient is in agreement with the above plan. The patient left the office in stable condition.  The patient will follow up in 6 weeks due to medication change.   Medication Adjustments/Labs and Tests Ordered: Current medicines are reviewed at length with the patient today.  Concerns regarding medicines are outlined above.  No orders of the defined types were placed in this encounter.  Meds ordered this encounter  Medications  . propranolol (INDERAL) 10 MG tablet    Sig: Take 1 tablet (10 mg total) by mouth 2 (two) times daily.    Dispense:  60 tablet    Refill:  1    Patient Instructions  Medication Instructions:  Your physician has recommended you make the following change in your medication:   START: propranolol 10 mg twice daily  *If you need a refill on your cardiac medications before your next appointment, please call your pharmacy*   Lab Work: None If you have labs (blood work) drawn today and your tests are completely normal, you will receive your results only by: Marland Kitchen MyChart Message (if you have MyChart) OR . A paper copy in the mail If you have any lab test that is abnormal or we need to change your treatment, we will call you to review the results.   Testing/Procedures: none   Follow-Up: At [facility], you and your health needs are our priority.  As part of our continuing mission to provide you with exceptional heart care, we have created designated Provider Care Teams.  These Care Teams include your primary Cardiologist (physician)  and Advanced Practice Providers (APPs -  Physician Assistants and Nurse Practitioners) who all work together to provide you with the care you need, when you need it.  We recommend signing up for the patient portal called "MyChart".  Sign up information is provided on this After Visit Summary.  MyChart is used to connect with patients for Virtual Visits (Telemedicine).  Patients are able to view lab/test results, encounter notes, upcoming appointments, etc.  Non-urgent messages can be sent to  your provider as well.   To learn more about what you can do with MyChart, go to [email or web address].    Your next appointment:   8 week(s)  The format for your next appointment:   In Person  Provider:   Berniece Salines, DO   Other Instructions  Propranolol Tablets What is this medicine? PROPRANOLOL (proe PRAN oh lole) is a beta blocker. It decreases the amount of work your heart has to do and helps your heart beat regularly. It treats high blood pressure and/or prevent chest pain (also called angina). It is also used after a heart attack to prevent a second one. This medicine may be used for other purposes; ask your health care provider or pharmacist if you have questions. COMMON BRAND NAME(S): Inderal What should I tell my health care provider before I take this medicine? They need to know if you have any of these conditions:  circulation problems or blood vessel disease  diabetes  history of heart attack or heart disease, vasospastic angina  kidney disease  liver disease  lung or breathing disease, like asthma or emphysema  pheochromocytoma  slow heart rate  thyroid disease  an unusual or allergic reaction to propranolol, other beta-blockers, medicines, foods, dyes, or preservatives  pregnant or trying to get pregnant  breast-feeding How should I use this medicine? Take this drug by mouth. Take it as directed on the prescription label at the same time every day. Keep taking it  unless your health care provider tells you to stop. Talk to your health care provider about the use of this drug in children. Special care may be needed. Overdosage: If you think you have taken too much of this medicine contact a poison control center or emergency room at once. NOTE: This medicine is only for you. Do not share this medicine with others. What if I miss a dose? If you miss a dose, take it as soon as you can. If it is almost time for your next dose, take only that dose. Do not take double or extra doses. What may interact with this medicine? Do not take this medicine with any of the following medications:  feverfew  phenothiazines like chlorpromazine, mesoridazine, prochlorperazine, thioridazine This medicine may also interact with the following medications:  aluminum hydroxide gel  antipyrine  antiviral medicines for HIV or AIDS  barbiturates like phenobarbital  certain medicines for blood pressure, heart disease, irregular heart beat  cimetidine  ciprofloxacin  diazepam  fluconazole  haloperidol  isoniazid  medicines for cholesterol like cholestyramine or colestipol  medicines for mental depression  medicines for migraine headache like almotriptan, eletriptan, frovatriptan, naratriptan, rizatriptan, sumatriptan, zolmitriptan  NSAIDs, medicines for pain and inflammation, like ibuprofen or naproxen  phenytoin  rifampin  teniposide  theophylline  thyroid medicines  tolbutamide  warfarin  zileuton This list may not describe all possible interactions. Give your health care provider a list of all the medicines, herbs, non-prescription drugs, or dietary supplements you use. Also tell them if you smoke, drink alcohol, or use illegal drugs. Some items may interact with your medicine. What should I watch for while using this medicine? Visit your doctor or health care professional for regular check ups. Check your blood pressure and pulse rate  regularly. Ask your health care professional what your blood pressure and pulse rate should be, and when you should contact them. You may get drowsy or dizzy. Do not drive, use machinery, or do anything that needs mental alertness until you know how  this drug affects you. Do not stand or sit up quickly, especially if you are an older patient. This reduces the risk of dizzy or fainting spells. Alcohol can make you more drowsy and dizzy. Avoid alcoholic drinks. This medicine may increase blood sugar. Ask your healthcare provider if changes in diet or medicines are needed if you have diabetes. Do not treat yourself for coughs, colds, or pain while you are taking this medicine without asking your doctor or health care professional for advice. Some ingredients may increase your blood pressure. What side effects may I notice from receiving this medicine? Side effects that you should report to your doctor or health care professional as soon as possible:  allergic reactions like skin rash, itching or hives, swelling of the face, lips, or tongue  breathing problems  cold hands or feet  difficulty sleeping, nightmares  dry peeling skin  hallucinations  muscle cramps or weakness  signs and symptoms of high blood sugar such as being more thirsty or hungry or having to urinate more than normal. You may also feel very tired or have blurry vision.  slow heart rate  swelling of the legs and ankles  vomiting Side effects that usually do not require medical attention (report to your doctor or health care professional if they continue or are bothersome):  change in sex drive or performance  diarrhea  dry sore eyes  hair loss  nausea  weak or tired This list may not describe all possible side effects. Call your doctor for medical advice about side effects. You may report side effects to FDA at 1-800-FDA-1088. Where should I keep my medicine? Keep out of the reach of children and pets. Store  at room temperature between 20 and 25 degrees C (68 and 77 degrees F). Protect from light. Throw away any unused drug after the expiration date. NOTE: This sheet is a summary. It may not cover all possible information. If you have questions about this medicine, talk to your doctor, pharmacist, or health care provider.  2021 Elsevier/Gold Standard (2018-11-04 19:25:51)      Adopting a Healthy Lifestyle.  Know what a healthy weight is for you (roughly BMI <25) and aim to maintain this   Aim for 7+ servings of fruits and vegetables daily   65-80+ fluid ounces of water or unsweet tea for healthy kidneys   Limit to max 1 drink of alcohol per day; avoid smoking/tobacco   Limit animal fats in diet for cholesterol and heart health - choose grass fed whenever available   Avoid highly processed foods, and foods high in saturated/trans fats   Aim for low stress - take time to unwind and care for your mental health   Aim for 150 min of moderate intensity exercise weekly for heart health, and weights twice weekly for bone health   Aim for 7-9 hours of sleep daily   When it comes to diets, agreement about the perfect plan isnt easy to find, even among the experts. Experts at the [facility] [employer] developed an idea known as the Healthy Eating Plate. Just imagine a plate divided into logical, healthy portions.   The emphasis is on diet quality:   Load up on vegetables and fruits - one-half of your plate: Aim for color and variety, and remember that potatoes dont count.   Go for whole grains - one-quarter of your plate: Whole wheat, barley, wheat berries, quinoa, oats, brown rice, and foods made with them. If you want pasta,  go with whole wheat pasta.   Protein power - one-quarter of your plate: Fish, chicken, beans, and nuts are all healthy, versatile protein sources. Limit red meat.   The diet, however, does go beyond the plate, offering a few other suggestions.   Use  healthy plant oils, such as olive, canola, soy, corn, sunflower and peanut. Check the labels, and avoid partially hydrogenated oil, which have unhealthy trans fats.   If youre thirsty, drink water. Coffee and tea are good in moderation, but skip sugary drinks and limit milk and dairy products to one or two daily servings.   The type of carbohydrate in the diet is more important than the amount. Some sources of carbohydrates, such as vegetables, fruits, whole grains, and beans-are healthier than others.   Finally, stay active  Signed, Berniece Salines, DO  06/27/2020 2:27 PM    [facility]

## 2020-06-27 NOTE — Patient Instructions (Signed)
Medication Instructions:  Your physician has recommended you make the following change in your medication:   START: propranolol 10 mg twice daily  *If you need a refill on your cardiac medications before your next appointment, please call your pharmacy*   Lab Work: None If you have labs (blood work) drawn today and your tests are completely normal, you will receive your results only by: Marland Kitchen MyChart Message (if you have MyChart) OR . A paper copy in the mail If you have any lab test that is abnormal or we need to change your treatment, we will call you to review the results.   Testing/Procedures: none   Follow-Up: At [facility], you and your health needs are our priority.  As part of our continuing mission to provide you with exceptional heart care, we have created designated Provider Care Teams.  These Care Teams include your primary Cardiologist (physician) and Advanced Practice Providers (APPs -  Physician Assistants and Nurse Practitioners) who all work together to provide you with the care you need, when you need it.  We recommend signing up for the patient portal called "MyChart".  Sign up information is provided on this After Visit Summary.  MyChart is used to connect with patients for Virtual Visits (Telemedicine).  Patients are able to view lab/test results, encounter notes, upcoming appointments, etc.  Non-urgent messages can be sent to your provider as well.   To learn more about what you can do with MyChart, go to [email or web address].    Your next appointment:   8 week(s)  The format for your next appointment:   In Person  Provider:   Thomasene Ripple, DO   Other Instructions  Propranolol Tablets What is this medicine? PROPRANOLOL (proe PRAN oh lole) is a beta blocker. It decreases the amount of work your heart has to do and helps your heart beat regularly. It treats high blood pressure and/or prevent chest pain (also called angina). It is also used after a heart  attack to prevent a second one. This medicine may be used for other purposes; ask your health care provider or pharmacist if you have questions. COMMON BRAND NAME(S): Inderal What should I tell my health care provider before I take this medicine? They need to know if you have any of these conditions:  circulation problems or blood vessel disease  diabetes  history of heart attack or heart disease, vasospastic angina  kidney disease  liver disease  lung or breathing disease, like asthma or emphysema  pheochromocytoma  slow heart rate  thyroid disease  an unusual or allergic reaction to propranolol, other beta-blockers, medicines, foods, dyes, or preservatives  pregnant or trying to get pregnant  breast-feeding How should I use this medicine? Take this drug by mouth. Take it as directed on the prescription label at the same time every day. Keep taking it unless your health care provider tells you to stop. Talk to your health care provider about the use of this drug in children. Special care may be needed. Overdosage: If you think you have taken too much of this medicine contact a poison control center or emergency room at once. NOTE: This medicine is only for you. Do not share this medicine with others. What if I miss a dose? If you miss a dose, take it as soon as you can. If it is almost time for your next dose, take only that dose. Do not take double or extra doses. What may interact with this medicine? Do not take this  medicine with any of the following medications:  feverfew  phenothiazines like chlorpromazine, mesoridazine, prochlorperazine, thioridazine This medicine may also interact with the following medications:  aluminum hydroxide gel  antipyrine  antiviral medicines for HIV or AIDS  barbiturates like phenobarbital  certain medicines for blood pressure, heart disease, irregular heart  beat  cimetidine  ciprofloxacin  diazepam  fluconazole  haloperidol  isoniazid  medicines for cholesterol like cholestyramine or colestipol  medicines for mental depression  medicines for migraine headache like almotriptan, eletriptan, frovatriptan, naratriptan, rizatriptan, sumatriptan, zolmitriptan  NSAIDs, medicines for pain and inflammation, like ibuprofen or naproxen  phenytoin  rifampin  teniposide  theophylline  thyroid medicines  tolbutamide  warfarin  zileuton This list may not describe all possible interactions. Give your health care provider a list of all the medicines, herbs, non-prescription drugs, or dietary supplements you use. Also tell them if you smoke, drink alcohol, or use illegal drugs. Some items may interact with your medicine. What should I watch for while using this medicine? Visit your doctor or health care professional for regular check ups. Check your blood pressure and pulse rate regularly. Ask your health care professional what your blood pressure and pulse rate should be, and when you should contact them. You may get drowsy or dizzy. Do not drive, use machinery, or do anything that needs mental alertness until you know how this drug affects you. Do not stand or sit up quickly, especially if you are an older patient. This reduces the risk of dizzy or fainting spells. Alcohol can make you more drowsy and dizzy. Avoid alcoholic drinks. This medicine may increase blood sugar. Ask your healthcare provider if changes in diet or medicines are needed if you have diabetes. Do not treat yourself for coughs, colds, or pain while you are taking this medicine without asking your doctor or health care professional for advice. Some ingredients may increase your blood pressure. What side effects may I notice from receiving this medicine? Side effects that you should report to your doctor or health care professional as soon as possible:  allergic reactions  like skin rash, itching or hives, swelling of the face, lips, or tongue  breathing problems  cold hands or feet  difficulty sleeping, nightmares  dry peeling skin  hallucinations  muscle cramps or weakness  signs and symptoms of high blood sugar such as being more thirsty or hungry or having to urinate more than normal. You may also feel very tired or have blurry vision.  slow heart rate  swelling of the legs and ankles  vomiting Side effects that usually do not require medical attention (report to your doctor or health care professional if they continue or are bothersome):  change in sex drive or performance  diarrhea  dry sore eyes  hair loss  nausea  weak or tired This list may not describe all possible side effects. Call your doctor for medical advice about side effects. You may report side effects to FDA at 1-800-FDA-1088. Where should I keep my medicine? Keep out of the reach of children and pets. Store at room temperature between 20 and 25 degrees C (68 and 77 degrees F). Protect from light. Throw away any unused drug after the expiration date. NOTE: This sheet is a summary. It may not cover all possible information. If you have questions about this medicine, talk to your doctor, pharmacist, or health care provider.  2021 Elsevier/Gold Standard (2018-11-04 19:25:51)

## 2020-07-02 NOTE — Addendum Note (Signed)
Addended by: Roddie Mc on: 07/02/2020 03:23 PM   Modules accepted: Orders

## 2020-08-23 ENCOUNTER — Other Ambulatory Visit: Payer: Self-pay

## 2020-08-23 ENCOUNTER — Ambulatory Visit (INDEPENDENT_AMBULATORY_CARE_PROVIDER_SITE_OTHER): Payer: Medicaid Other | Admitting: Cardiology

## 2020-08-23 ENCOUNTER — Encounter: Payer: Self-pay | Admitting: Cardiology

## 2020-08-23 DIAGNOSIS — I471 Supraventricular tachycardia: Secondary | ICD-10-CM | POA: Diagnosis not present

## 2020-08-23 MED ORDER — PROPRANOLOL HCL 10 MG PO TABS: 10.0000 mg | ORAL_TABLET | Freq: Three times a day (TID) | ORAL | 5 refills | Status: DC

## 2020-08-23 NOTE — Progress Notes (Signed)
Cardiology Office Note:    Date:  08/23/2020   ID:  Veronica Boyle, DOB 09/18/1990, MRN [id number]  PCP:  Marco Collie, MD  Cardiologist:  Berniece Salines, DO  Electrophysiologist:  None   Referring MD: No ref. provider found   I have had some palpitations  History of Present Illness:    Veronica Boyle is a 30 y.o. female with a hx of gestational hypertension, preeclampsia, history of anxiety and paroxysmal SVT is here today for follow-up visit.  I saw the patient on January 13, 2020 this was after her C-section she was doing well from a cardiovascular standpoint.  In March 2022 she presented to be evaluated given the fact that she was experiencing some shortness of breath and chest tightness.  After discussing with the patient clinically it sounded like anxiety may be playing a role for her increased palpitations.  So we added propanolol 10 mg twice a day to her medication regimen.  The patient tells me that she started the propanolol and it was doing great.  But recently her daughter went through her surgery and her baby is teething so she has had a lot of stress and the palpitations are resurfacing again.  She also notes that where she was going for her weight loss treatments did not work out so she would like to be referred to medical weight management program.   Past Medical History:  Diagnosis Date  . Anxiety 11/15/2014  . Arrhythmia   . Atypical chest pain 11/15/2014   [facility] 2016 and stress echo done.   Marland Kitchen BMI 50.0-59.9, adult ([place]) 08/07/2019  . Gestational hypertension   . History of fetal abnormality in previous pregnancy, currently pregnant 08/16/2013   No issues with 2nd baby See 08/24/2013 genetics note  States first baby had "two uteruses and 3 kidneys".  Delivered at [facility]. C/S for FTD and NRFHR. T[facility] baby [facility]  . History of gestational hypertension 07/31/2019  . Hypertension 03/25/2020  . Low vitamin D level 09/14/2019  . Metabolic syndrome 73/53/2992  .  Morbid obesity ([place]) 03/25/2020  . Palpitations 11/15/2014  . Paroxysmal supraventricular tachycardia ([place]) 11/15/2014   Seen by [facility] 11/2019. Normal echo, EF 10/2019. No e/o SVT on holter. RTC 68months. Pt on no meds currently    . Postpartum care and examination 03/25/2020  . Prediabetes   . Preeclampsia 01/05/2020   Elevated UP:C  . 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 Sciatic leg pain 08/07/2019  . SVT (supraventricular tachycardia) ([facility])     Past Surgical History:  Procedure Laterality Date  . CESAREAN SECTION    . CESAREAN SECTION N/A 01/25/2014   Procedure: CESAREAN SECTION;  Surgeon: Jonnie Kind, MD;  Location: [place] [facility];  Service: Obstetrics;  Laterality: N/A;  . CESAREAN SECTION N/A 01/05/2020   Procedure: CESAREAN SECTION;  Surgeon: Woodroe Mode, MD;  Location: [facility];  Service: Obstetrics;  Laterality: N/A;  . TONSILLECTOMY    . WISDOM TOOTH EXTRACTION  2012    Current Medications: Current Meds  Medication Sig  . Blood Pressure Monitoring (BLOOD PRESSURE KIT) DEVI 1 Device by Does not apply route as needed.  . cetirizine (ZYRTEC) 10 MG tablet Take 10 mg by mouth daily.  . propranolol (INDERAL) 10 MG tablet Take 1 tablet (10 mg total) by mouth every 8 (eight) hours.  . [DISCONTINUED] propranolol (INDERAL) 10 MG tablet Take 1 tablet (10 mg total) by mouth 2 (two) times daily.  Allergies:   Vancomycin   Social History   Socioeconomic History  . Marital status: Single    Spouse name: Not on file  . Number of children: Not on file  . Years of education: Not on file  . Highest education level: Not on file  Occupational History  . Not on file  Tobacco Use  . Smoking status: Never Smoker  . Smokeless tobacco: Never Used  Vaping Use  . Vaping Use: Never used  Substance and Sexual Activity  . Alcohol use: No  . Drug use: No  . Sexual activity: Yes    Birth control/protection: None  Other Topics Concern  . Not on file  Social History Narrative  . Not on file    Social Determinants of Health   Financial Resource Strain: Not on file  Food Insecurity: No Food Insecurity  . Worried About Charity fundraiser in the Last Year: Never true  . Ran Out of Food in the Last Year: Never true  Transportation Needs: No Transportation Needs  . Lack of Transportation (Medical): No  . Lack of Transportation (Non-Medical): No  Physical Activity: Not on file  Stress: Not on file  Social Connections: Not on file     Family History: The patient's family history includes Asthma in her brother, father, and sister; Birth defects in her daughter; Diabetes in her mother; Hyperlipidemia in her mother; Hypertension in her mother.  ROS:   Review of Systems  Constitution: Negative for decreased appetite, fever and weight gain.  HENT: Negative for congestion, ear discharge, hoarse voice and sore throat.   Eyes: Negative for discharge, redness, vision loss in right eye and visual halos.  Cardiovascular: Negative for chest pain, dyspnea on exertion, leg swelling, orthopnea and palpitations.  Respiratory: Negative for cough, hemoptysis, shortness of breath and snoring.   Endocrine: Negative for heat intolerance and polyphagia.  Hematologic/Lymphatic: Negative for bleeding problem. Does not bruise/bleed easily.  Skin: Negative for flushing, nail changes, rash and suspicious lesions.  Musculoskeletal: Negative for arthritis, joint pain, muscle cramps, myalgias, neck pain and stiffness.  Gastrointestinal: Negative for abdominal pain, bowel incontinence, diarrhea and excessive appetite.  Genitourinary: Negative for decreased libido, genital sores and incomplete emptying.  Neurological: Negative for brief paralysis, focal weakness, headaches and loss of balance.  Psychiatric/Behavioral: Negative for altered mental status, depression and suicidal ideas.  Allergic/Immunologic: Negative for HIV exposure and persistent infections.    EKGs/Labs/Other Studies Reviewed:    The  following studies were reviewed today:   EKG: None today  ZIO monitor The patient wore the monitor for 7 days starting October 17, 2019. Indication: Palpitations The minimum heart rate was 58 bpm, maximum heart rate was 156 bpm, and average heart rate was 98 bpm. Predominant underlying rhythm was Sinus Rhythm.   Premature atrial complexes were rare less than 1%. Premature Ventricular complexes rare less than 1%.  No ventricular tachycardia, no pauses, No AV block, no supraventricular tachycardia and no atrial fibrillation present.  2 patient triggered events into diary events all associated with sinus rhythm.  Conclusion: Normal/unremarkable study  Recent Labs: 10/17/2019: TSH 0.869 01/05/2020: ALT 18 03/25/2020: BUN 7; Creatinine, Ser 0.58; Hemoglobin 11.8; Magnesium 1.7; Platelets 286; Potassium 4.3; Sodium 136  Recent Lipid Panel    Component Value Date/Time   CHOL 159 01/19/2019 1157   TRIG 255 (H) 01/19/2019 1157   HDL 33 (L) 01/19/2019 1157   CHOLHDL 4.8 (H) 01/19/2019 1157   LDLCALC 84 01/19/2019 1157    Physical  Exam:    VS:  BP 118/82   Pulse 88   Ht $R'5\' 6"'rF$  (1.676 m)   Wt (!) 329 lb (149.2 kg)   SpO2 98%   BMI 53.10 kg/m     Wt Readings from Last 3 Encounters:  08/23/20 (!) 329 lb (149.2 kg)  06/27/20 (!) 328 lb (148.8 kg)  03/25/20 (!) 314 lb 9.6 oz (142.7 kg)     GEN: Well nourished, well developed in no acute distress HEENT: Normal NECK: No JVD; No carotid bruits LYMPHATICS: No lymphadenopathy CARDIAC: S1S2 noted,RRR, no murmurs, rubs, gallops RESPIRATORY:  Clear to auscultation without rales, wheezing or rhonchi  ABDOMEN: Soft, non-tender, non-distended, +bowel sounds, no guarding. EXTREMITIES: No edema, No cyanosis, no clubbing MUSCULOSKELETAL:  No deformity  SKIN: Warm and dry NEUROLOGIC:  Alert and oriented x 3, non-focal PSYCHIATRIC:  Normal affect, good insight  ASSESSMENT:    1. Morbid obesity ([place])   2. PSVT (paroxysmal  supraventricular tachycardia) ([place])    PLAN:     1.  I will increase her propanolol to 10 mg every 8 hours.  I discussed with the patient how to use this medication. 2.  We will also refer her to our medical weight management program.    The patient is in agreement with the above plan. The patient left the office in stable condition.  The patient will follow up in 1 year or sooner if needed.   Medication Adjustments/Labs and Tests Ordered: Current medicines are reviewed at length with the patient today.  Concerns regarding medicines are outlined above.  Orders Placed This Encounter  Procedures  . Amb Ref to Medical Weight Management   Meds ordered this encounter  Medications  . propranolol (INDERAL) 10 MG tablet    Sig: Take 1 tablet (10 mg total) by mouth every 8 (eight) hours.    Dispense:  180 tablet    Refill:  5    Patient Instructions  Medication Instructions:  Your physician has recommended you make the following change in your medication: INCREASE: Propranolol 10 mg every 8 hours *If you need a refill on your cardiac medications before your next appointment, please call your pharmacy*   Lab Work: None If you have labs (blood work) drawn today and your tests are completely normal, you will receive your results only by: Marland Kitchen MyChart Message (if you have MyChart) OR . A paper copy in the mail If you have any lab test that is abnormal or we need to change your treatment, we will call you to review the results.   Testing/Procedures: None   Follow-Up: At [facility], you and your health needs are our priority.  As part of our continuing mission to provide you with exceptional heart care, we have created designated Provider Care Teams.  These Care Teams include your primary Cardiologist (physician) and Advanced Practice Providers (APPs -  Physician Assistants and Nurse Practitioners) who all work together to provide you with the care you need, when you need it.  We  recommend signing up for the patient portal called "MyChart".  Sign up information is provided on this After Visit Summary.  MyChart is used to connect with patients for Virtual Visits (Telemedicine).  Patients are able to view lab/test results, encounter notes, upcoming appointments, etc.  Non-urgent messages can be sent to your provider as well.   To learn more about what you can do with MyChart, go to [email or web address].    Your next appointment:   1 year(s)  The  format for your next appointment:   In Person  Provider:   Eliezer Bottom - Dr. Harriet Masson   Other Instructions      Adopting a Healthy Lifestyle.  Know what a healthy weight is for you (roughly BMI <25) and aim to maintain this   Aim for 7+ servings of fruits and vegetables daily   65-80+ fluid ounces of water or unsweet tea for healthy kidneys   Limit to max 1 drink of alcohol per day; avoid smoking/tobacco   Limit animal fats in diet for cholesterol and heart health - choose grass fed whenever available   Avoid highly processed foods, and foods high in saturated/trans fats   Aim for low stress - take time to unwind and care for your mental health   Aim for 150 min of moderate intensity exercise weekly for heart health, and weights twice weekly for bone health   Aim for 7-9 hours of sleep daily   When it comes to diets, agreement about the perfect plan isnt easy to find, even among the experts. Experts at the [employer] developed an idea known as the Healthy Eating Plate. Just imagine a plate divided into logical, healthy portions.   The emphasis is on diet quality:   Load up on vegetables and fruits - one-half of your plate: Aim for color and variety, and remember that potatoes dont count.   Go for whole grains - one-quarter of your plate: Whole wheat, barley, wheat berries, quinoa, oats, brown rice, and foods made with them. If you want pasta, go with whole wheat pasta.   Protein  power - one-quarter of your plate: Fish, chicken, beans, and nuts are all healthy, versatile protein sources. Limit red meat.   The diet, however, does go beyond the plate, offering a few other suggestions.   Use healthy plant oils, such as olive, canola, soy, corn, sunflower and peanut. Check the labels, and avoid partially hydrogenated oil, which have unhealthy trans fats.   If youre thirsty, drink water. Coffee and tea are good in moderation, but skip sugary drinks and limit milk and dairy products to one or two daily servings.   The type of carbohydrate in the diet is more important than the amount. Some sources of carbohydrates, such as vegetables, fruits, whole grains, and beans-are healthier than others.   Finally, stay active  Signed, Berniece Salines, DO  08/23/2020 8:47 AM    [place]

## 2020-08-23 NOTE — Patient Instructions (Signed)
Medication Instructions:  Your physician has recommended you make the following change in your medication: INCREASE: Propranolol 10 mg every 8 hours *If you need a refill on your cardiac medications before your next appointment, please call your pharmacy*   Lab Work: None If you have labs (blood work) drawn today and your tests are completely normal, you will receive your results only by: Marland Kitchen MyChart Message (if you have MyChart) OR . A paper copy in the mail If you have any lab test that is abnormal or we need to change your treatment, we will call you to review the results.   Testing/Procedures: None   Follow-Up: At [facility], you and your health needs are our priority.  As part of our continuing mission to provide you with exceptional heart care, we have created designated Provider Care Teams.  These Care Teams include your primary Cardiologist (physician) and Advanced Practice Providers (APPs -  Physician Assistants and Nurse Practitioners) who all work together to provide you with the care you need, when you need it.  We recommend signing up for the patient portal called "MyChart".  Sign up information is provided on this After Visit Summary.  MyChart is used to connect with patients for Virtual Visits (Telemedicine).  Patients are able to view lab/test results, encounter notes, upcoming appointments, etc.  Non-urgent messages can be sent to your provider as well.   To learn more about what you can do with MyChart, go to [email or web address].    Your next appointment:   1 year(s)  The format for your next appointment:   In Person  Provider:   Elease Hashimoto - Dr. Servando Salina   Other Instructions

## 2020-09-24 ENCOUNTER — Encounter (INDEPENDENT_AMBULATORY_CARE_PROVIDER_SITE_OTHER): Payer: Self-pay

## 2020-09-26 IMAGING — US US MFM FETAL BPP W/O NON-STRESS
1 series · 13 of 28 positions shown · non-contrast
Comparison: none

[Series 1: us mfm fetal bpp w/o non-stress · 65 acquisitions, 13 frames shown]
[im 3/65]
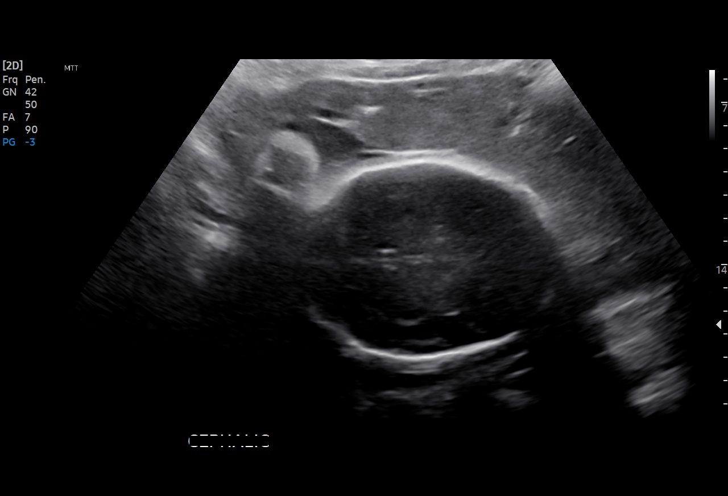
[im 8/65]
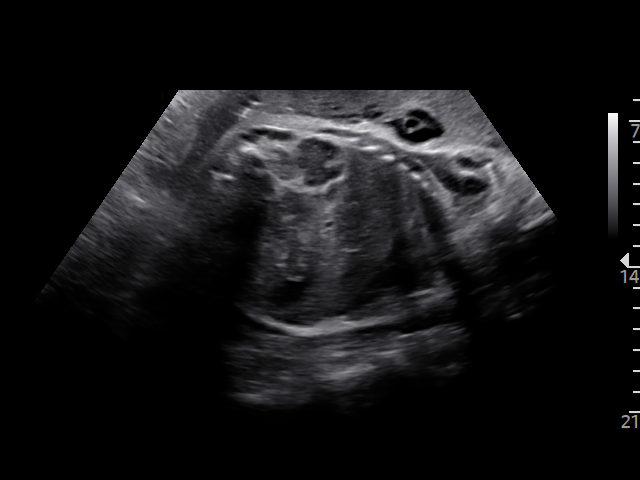
[im 12/65]
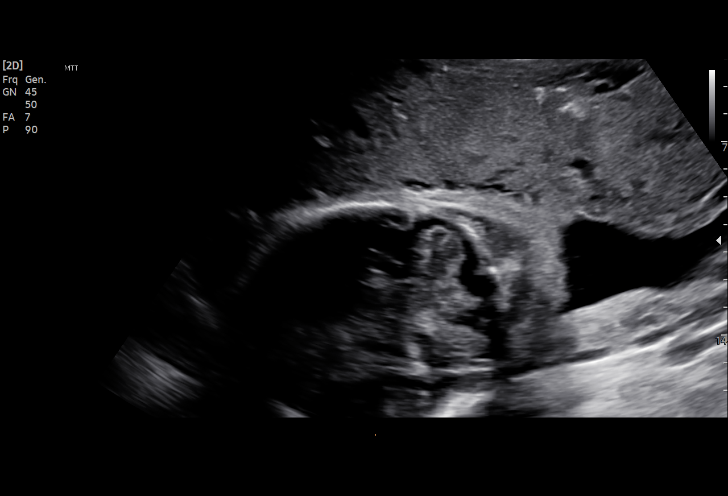
[im 17/65]
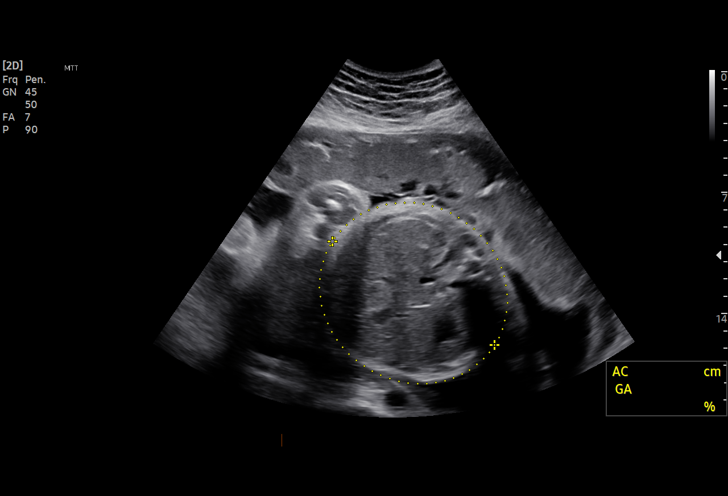
[im 22/65]
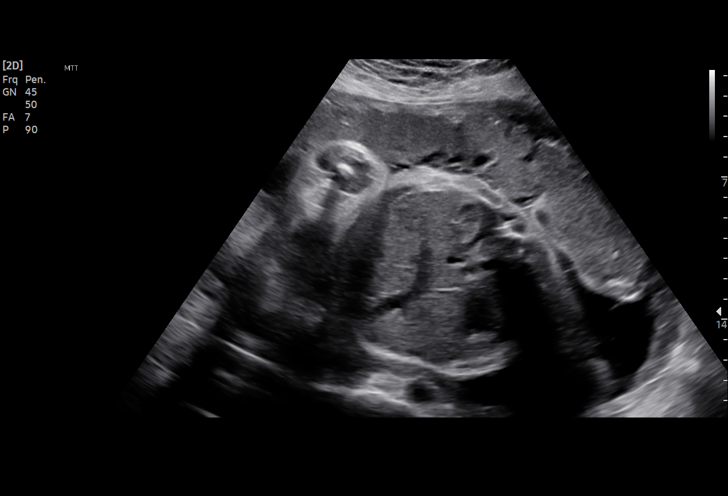
[im 27/65]
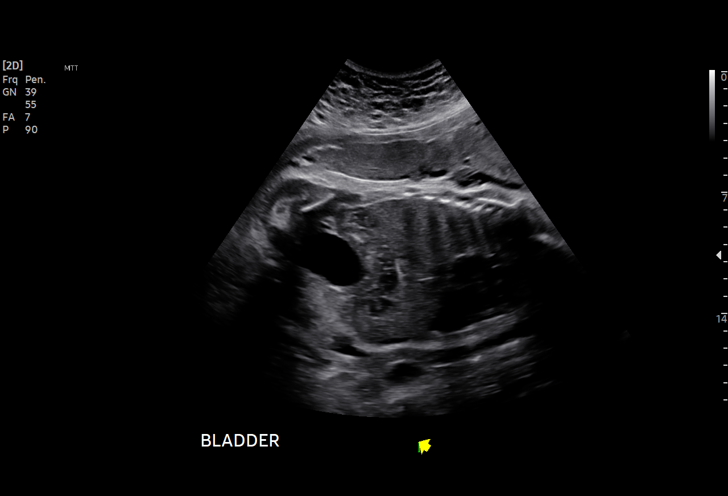
[im 34/65]
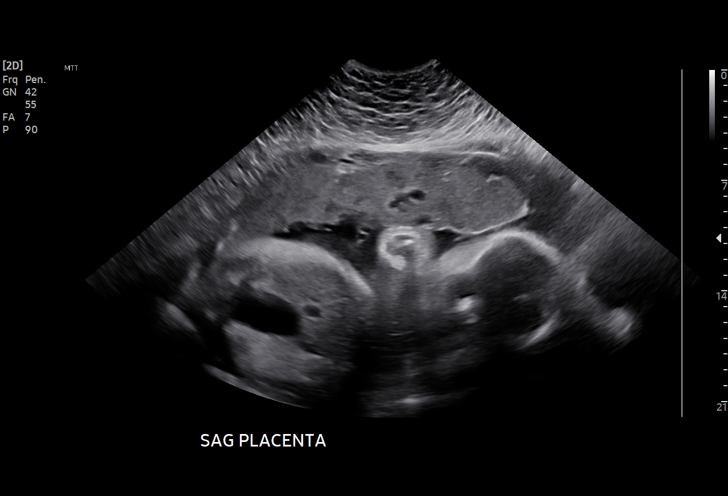
[im 38/65]
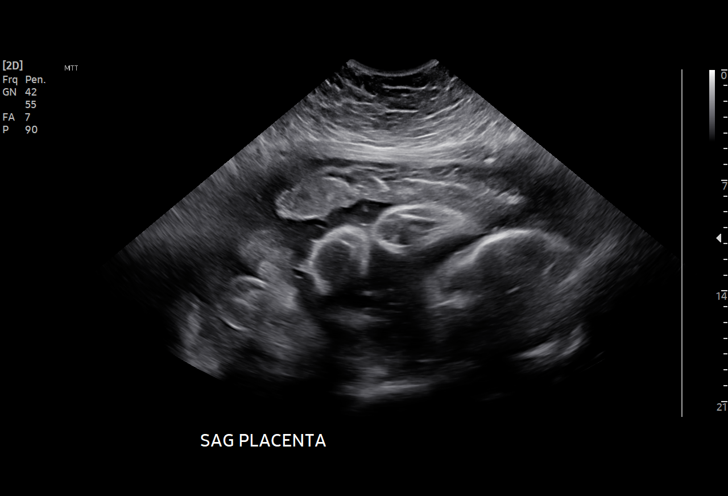
[im 43/65]
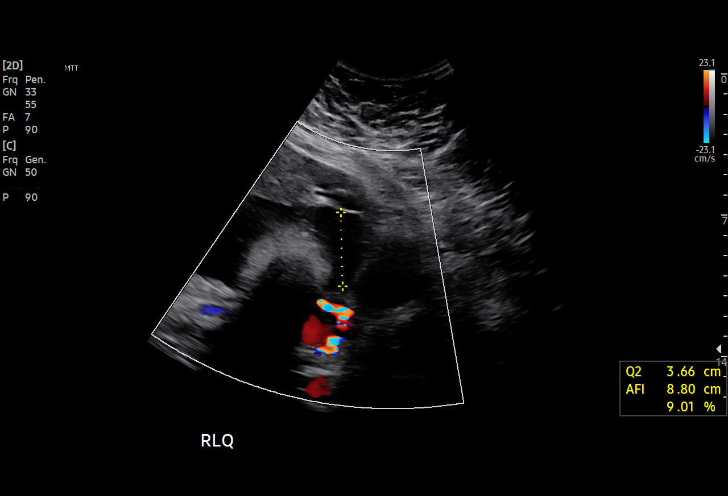
[im 48/65]
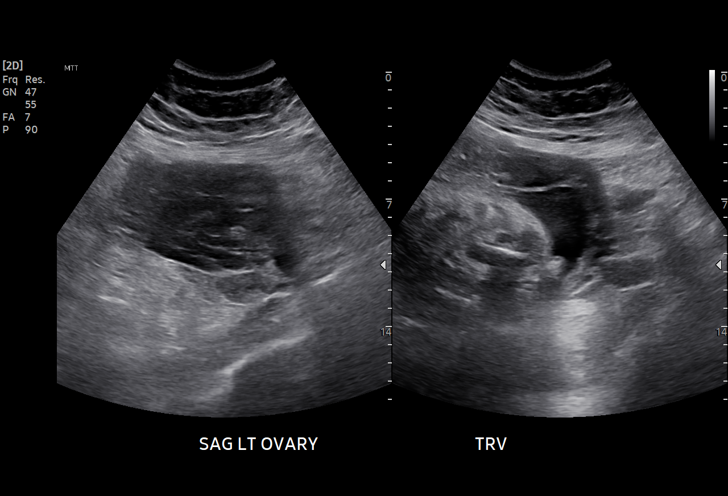
[im 53/65]
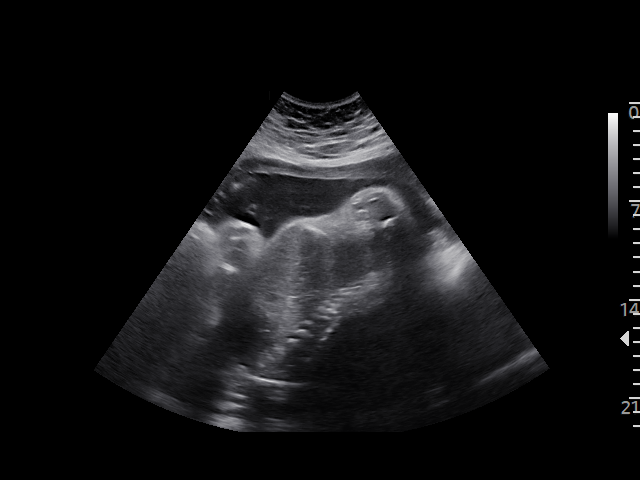
[im 57/65]
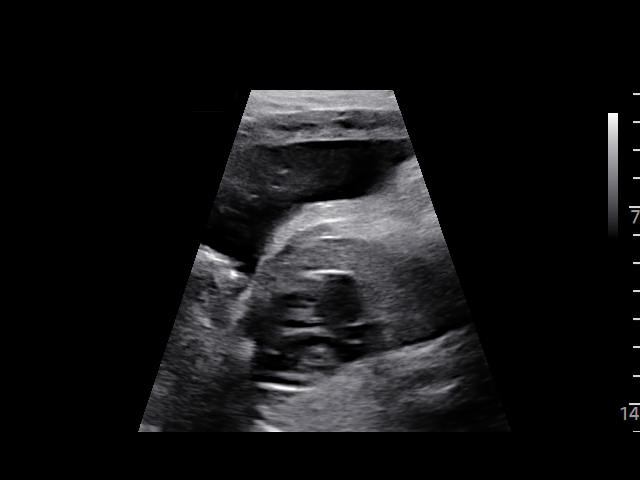
[im 62/65]
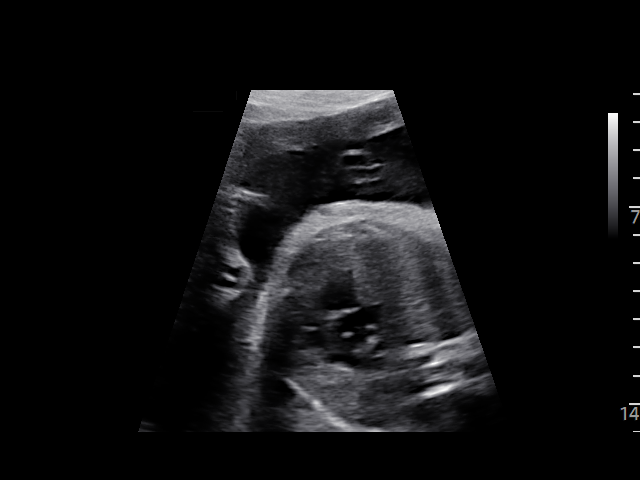

[13 of 28 positions shown; findings below may reference images not displayed]

Indications

 Diabetes - Pregestational,3rd trimester,
 Normal Fetal Echo
 36 weeks gestation of pregnancy
 Maternal morbid obesity (BMI 51)
 History of cesarean delivery, currently
 pregnant (x2)
 Poor obstetric history: Previous
 preeclampsia / eclampsia/gestational HTN
 Low risk NIPS(Normal AFP)
 Encounter for other antenatal screening
 follow-up
Vital Signs

                                                Height:        5'5"
Fetal Evaluation

 Num Of Fetuses:         1
 Fetal Heart Rate(bpm):  145
 Cardiac Activity:       Observed
 Presentation:           Cephalic
 Placenta:               Anterior
 P. Cord Insertion:      Previously Visualized
 Amniotic Fluid
 AFI FV:      Subjectively upper-normal

 AFI Sum(cm)     %Tile       Largest Pocket(cm)
 20.97           79

 RUQ(cm)       RLQ(cm)       LUQ(cm)        LLQ(cm)

Biophysical Evaluation

 Amniotic F.V:   Pocket => 2 cm             F. Tone:        Observed
 F. Movement:    Observed                   Score:          [DATE]
 F. Breathing:   Observed
Biometry

 BPD:      84.7  mm     G. Age:  34w 1d         10  %    CI:        69.83   %    70 - 86
                                                         FL/HC:      20.7   %    20.1 -
 HC:      323.4  mm     G. Age:  36w 4d         30  %    HC/AC:      0.97        0.93 -
 AC:      334.1  mm     G. Age:  37w 2d         88  %    FL/BPD:     79.1   %    71 - 87
 FL:         67  mm     G. Age:  34w 3d         10  %    FL/AC:      20.1   %    20 - 24

 LV:        8.8  mm

 Est. FW:    4202  gm      6 lb 5 oz     53  %
OB History

 Gravidity:    3         Term:   2
 Living:       2
Gestational Age

 U/S Today:     35w 4d                                        EDD:   01/22/20
 Best:          36w 1d     Det. By:  Early Ultrasound         EDD:   01/18/20
                                     (07/03/19)
Anatomy

 Cranium:               Previously seen        LVOT:                   Not well visualized
 Cavum:                 Previously seen        Aortic Arch:            Previously seen
 Ventricles:            Appears normal         Ductal Arch:            Not well visualized
 Choroid Plexus:        Previously seen        Diaphragm:              Appears normal
 Cerebellum:            Previously seen        Stomach:                Appears normal, left
                                                                       sided
 Posterior Fossa:       Previously seen        Abdomen:                Previously seen
 Nuchal Fold:           Previously seen        Abdominal Wall:         Previously seen
 Face:                  Orbits and profile     Cord Vessels:           Previously seen
                        previously seen
 Lips:                  Previously seen        Kidneys:                Appear normal
 Palate:                Not well visualized    Bladder:                Appears normal
 Thoracic:              Previously seen        Spine:                  Previously seen
 Heart:                 Appears normal         Upper Extremities:      Previously seen
                        (4CH, axis, and
                        situs)
 RVOT:                  Previously seen        Lower Extremities:      Previously seen

 Other:  Heels previously visualized. Technicallly difficult due to advanced GA,
         maternal habitus, and fetal position.
Cervix Uterus Adnexa

 Cervix
 Normal appearance by transabdominal scan.

 Uterus
 No abnormality visualized.

 Right Ovary
 Not visualized.

 Left Ovary
 Not visualized.

 Cul De Sac
 No free fluid seen.

 Adnexa
 No abnormality visualized.
Comments

 This patient was seen for a follow up growth scan due to
 maternal obesity maternal obesity and diabetes in pregnancy.
 She denies any problems since her last exam.
 She was informed that the fetal growth and amniotic fluid
 level appears appropriate for her gestational age.
 A biophysical profile performed today was [DATE].
 Due to maternal obesity and diabetes, we will continue to
 follow her with weekly fetal testing.
 Another biophysical profile was scheduled in 1 week.  She
 reports that she already has a cesarean delivery scheduled at
 around 39 weeks on January 13, 2020.

## 2021-11-19 ENCOUNTER — Encounter (INDEPENDENT_AMBULATORY_CARE_PROVIDER_SITE_OTHER): Payer: Self-pay

## 2022-07-10 ENCOUNTER — Encounter: Payer: Self-pay | Admitting: Cardiology

## 2022-07-10 ENCOUNTER — Ambulatory Visit: Payer: Medicaid Other | Attending: Cardiology | Admitting: Cardiology

## 2022-07-10 VITALS — BP 132/78 | HR 81 | Ht 66.0 in | Wt 352.2 lb

## 2022-07-10 DIAGNOSIS — R7303 Prediabetes: Secondary | ICD-10-CM | POA: Diagnosis not present

## 2022-07-10 DIAGNOSIS — R002 Palpitations: Secondary | ICD-10-CM | POA: Diagnosis not present

## 2022-07-10 MED ORDER — PROPRANOLOL HCL 20 MG PO TABS: 20.0000 mg | ORAL_TABLET | Freq: Two times a day (BID) | ORAL | 3 refills | Status: AC

## 2022-07-10 NOTE — Progress Notes (Signed)
Cardiology Office Note:    Date:  07/10/2022   ID:  SALIHAH Boyle, DOB 10/28/1990, MRN [id number]  PCP:  Marco Collie, MD  Cardiologist:  Berniece Salines, DO  Electrophysiologist:  None   Referring MD: Marco Collie, MD   I have had some palpitations  History of Present Illness:    Veronica Boyle is a 32 y.o. female with a hx of gestational hypertension, preeclampsia, history of anxiety and paroxysmal SVT is here today for follow-up visit.    At her last visit in 08/2020 at she was experiencing chest pain and palpitations- I increase her propranolol to 10 mg every 8 hours.  I also refer the patient to the medical weight management program.  In the interim she had stopped going to the medical risk management program.  She stopped the propranolol as well.    Today she comes for follow-up because she has been experiencing intermittent palpitations and some chest tightness.  She has not restarted her propranolol.  No other complaints at this time.  But she is interested in again losing weight.  Past Medical History:  Diagnosis Date   Anxiety 11/15/2014   Arrhythmia    Atypical chest pain 11/15/2014   [facility] and [facility] done.    BMI 50.0-59.9, adult ([place]) 08/07/2019   Gestational hypertension    History of fetal abnormality in previous pregnancy, currently pregnant 08/16/2013   No issues with 2nd baby See 08/24/2013 genetics note  States first baby had "two uteruses and 3 kidneys".  Delivered at [facility]. C/S for FTD and NRFHR. Takes baby to [facility] regularly   History of gestational hypertension 07/31/2019   Hypertension 03/25/2020   Low vitamin D level [id number]   Metabolic syndrome [id number]   Morbid obesity ([place]) 03/25/2020   Palpitations 11/15/2014   Paroxysmal supraventricular tachycardia 11/15/2014   Seen by [facility] 11/2019. Normal echo, EF 10/2019. No e/o SVT on holter. RTC 68months. 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   Sciatic leg pain 08/07/2019   SVT (supraventricular tachycardia)     Past Surgical History:  Procedure Laterality Date   CESAREAN SECTION     CESAREAN SECTION N/A 01/25/2014   Procedure: CESAREAN SECTION;  Surgeon: Jonnie Kind, MD;  Location: [facility];  Service: Obstetrics;  Laterality: N/A;   CESAREAN SECTION N/A 01/05/2020   Procedure: CESAREAN SECTION;  Surgeon: Woodroe Mode, MD;  Location: [facility];  Service: Obstetrics;  Laterality: N/A;   TONSILLECTOMY     WISDOM TOOTH EXTRACTION  2012    Current Medications: Current Meds  Medication Sig   Blood Pressure Monitoring (BLOOD PRESSURE KIT) DEVI 1 Device by Does not apply route as needed.   cetirizine (ZYRTEC) 10 MG tablet Take 10 mg by mouth daily.   propranolol (INDERAL) 20 MG tablet Take 1 tablet (20 mg total) by mouth 2 (two) times daily.   [DISCONTINUED] cetirizine (ZYRTEC) 10 MG tablet Take 10 mg by mouth daily.     Allergies:   Vancomycin   Social History   Socioeconomic History   Marital status: Single    Spouse name: Not on file   Number of children: Not on file   Years of education: Not on file   Highest education level: Not on file  Occupational History   Not on file  Tobacco Use  Smoking status: Never   Smokeless tobacco: Never  Vaping Use   Vaping Use: Never used  Substance and Sexual Activity   Alcohol use: No   Drug use: No   Sexual activity: Yes    Birth control/protection: None  Other Topics Concern   Not on file  Social History Narrative   Not on file   Social Determinants of Health   Financial Resource Strain: Not on file  Food Insecurity: No Food Insecurity (11/14/2019)   Hunger Vital Sign    Worried About Running Out of Food in the Last Year: Never true    Ran Out of Food in the Last Year: Never true  Transportation Needs: No Transportation Needs (11/14/2019)   PRAPARE - Hydrologist  (Medical): No    Lack of Transportation (Non-Medical): No  Recent Concern: Transportation Needs - Unmet Transportation Needs (10/03/2019)   PRAPARE - Hydrologist (Medical): Yes    Lack of Transportation (Non-Medical): Yes  Physical Activity: Not on file  Stress: Not on file  Social Connections: Not on file     Family History: The patient's family history includes Asthma in her brother, father, and sister; Birth defects in her daughter; Diabetes in her mother; Hyperlipidemia in her mother; Hypertension in her mother.  ROS:   Review of Systems  Constitution: Negative for decreased appetite, fever and weight gain.  HENT: Negative for congestion, ear discharge, hoarse voice and sore throat.   Eyes: Negative for discharge, redness, vision loss in right eye and visual halos.  Cardiovascular: Negative for chest pain, dyspnea on exertion, leg swelling, orthopnea and palpitations.  Respiratory: Negative for cough, hemoptysis, shortness of breath and snoring.   Endocrine: Negative for heat intolerance and polyphagia.  Hematologic/Lymphatic: Negative for bleeding problem. Does not bruise/bleed easily.  Skin: Negative for flushing, nail changes, rash and suspicious lesions.  Musculoskeletal: Negative for arthritis, joint pain, muscle cramps, myalgias, neck pain and stiffness.  Gastrointestinal: Negative for abdominal pain, bowel incontinence, diarrhea and excessive appetite.  Genitourinary: Negative for decreased libido, genital sores and incomplete emptying.  Neurological: Negative for brief paralysis, focal weakness, headaches and loss of balance.  Psychiatric/Behavioral: Negative for altered mental status, depression and suicidal ideas.  Allergic/Immunologic: Negative for HIV exposure and persistent infections.    EKGs/Labs/Other Studies Reviewed:    The following studies were reviewed today:   EKG: None today  ZIO monitor The patient wore the monitor for 7  days starting October 17, 2019. Indication: Palpitations The minimum heart rate was 58 bpm, maximum heart rate was 156  bpm, and average heart rate was 98 bpm. Predominant underlying rhythm was Sinus Rhythm.     Premature atrial complexes were rare less than 1%. Premature Ventricular complexes rare less than 1%.   No ventricular tachycardia, no pauses, No AV block, no supraventricular tachycardia and no atrial fibrillation present.   2 patient triggered events into diary events all associated with sinus rhythm.   Conclusion: Normal/unremarkable study  Recent Labs: No results found for requested labs within last 365 days.  Recent Lipid Panel    Component Value Date/Time   CHOL 159 01/19/2019 1157   TRIG 255 (H) 01/19/2019 1157   HDL 33 (L) 01/19/2019 1157   CHOLHDL 4.8 (H) 01/19/2019 1157   LDLCALC 84 01/19/2019 1157    Physical Exam:    VS:  BP 132/78   Pulse 81   Ht 5\' 6"  (1.676 m)   Wt Marland Kitchen)  352 lb 3.2 oz (159.8 kg)   SpO2 98%   BMI 56.85 kg/m     Wt Readings from Last 3 Encounters:  07/10/22 (!) 352 lb 3.2 oz (159.8 kg)  08/23/20 (!) 329 lb (149.2 kg)  06/27/20 (!) 328 lb (148.8 kg)     GEN: Well nourished, well developed in no acute distress HEENT: Normal NECK: No JVD; No carotid bruits LYMPHATICS: No lymphadenopathy CARDIAC: S1S2 noted,RRR, no murmurs, rubs, gallops RESPIRATORY:  Clear to auscultation without rales, wheezing or rhonchi  ABDOMEN: Soft, non-tender, non-distended, +bowel sounds, no guarding. EXTREMITIES: No edema, No cyanosis, no clubbing MUSCULOSKELETAL:  No deformity  SKIN: Warm and dry NEUROLOGIC:  Alert and oriented x 3, non-focal PSYCHIATRIC:  Normal affect, good insight  ASSESSMENT:    1. Prediabetes   2. Morbid obesity ([place])   3. Palpitations    PLAN:     I would like to restart her propranolol to 10 mg every 8 hours that she was taking hopefully this will help with the palpitations and the subsequent chest discomfort and  tightness. She is interested in losing weight with her prediabetes I am going to refer her to our pharmacy team to consider likely medication supported weight loss.   The patient is in agreement with the above plan. The patient left the office in stable condition.  The patient will follow up in 6 months or sooner if needed.  Or sooner if needed.   Medication Adjustments/Labs and Tests Ordered: Current medicines are reviewed at length with the patient today.  Concerns regarding medicines are outlined above.  Orders Placed This Encounter  Procedures   AMB Referral to [facility] Pharm-D   EKG 12-Lead   Meds ordered this encounter  Medications   propranolol (INDERAL) 20 MG tablet    Sig: Take 1 tablet (20 mg total) by mouth 2 (two) times daily.    Dispense:  180 tablet    Refill:  3    Patient Instructions  Medication Instructions:  Your physician has recommended you make the following change in your medication:  START: Propranolol 20 mg twice daily *If you need a refill on your cardiac medications before your next appointment, please call your pharmacy*   Lab Work: None   Testing/Procedures: None   Follow-Up: At [facility], you and your health needs are our priority.  As part of our continuing mission to provide you with exceptional heart care, we have created designated Provider Care Teams.  These Care Teams include your primary Cardiologist (physician) and Advanced Practice Providers (APPs -  Physician Assistants and Nurse Practitioners) who all work together to provide you with the care you need, when you need it.  Your next appointment:   6 month(s)  Provider:   Berniece Salines, DO    Other instructions: Please see pharm-D    Adopting a Healthy Lifestyle.  Know what a healthy weight is for you (roughly BMI <25) and aim to maintain this   Aim for 7+ servings of fruits and vegetables daily   65-80+ fluid ounces of water or unsweet tea for healthy kidneys    Limit to max 1 drink of alcohol per day; avoid smoking/tobacco   Limit animal fats in diet for cholesterol and heart health - choose grass fed whenever available   Avoid highly processed foods, and foods high in saturated/trans fats   Aim for low stress - take time to unwind and care for your mental health   Aim for 150 min of  moderate intensity exercise weekly for heart health, and weights twice weekly for bone health   Aim for 7-9 hours of sleep daily   When it comes to diets, agreement about the perfect plan isnt easy to find, even among the experts. Experts at the [employer] developed an idea known as the Healthy Eating Plate. Just imagine a plate divided into logical, healthy portions.   The emphasis is on diet quality:   Load up on vegetables and fruits - one-half of your plate: Aim for color and variety, and remember that potatoes dont count.   Go for whole grains - one-quarter of your plate: Whole wheat, barley, wheat berries, quinoa, oats, brown rice, and foods made with them. If you want pasta, go with whole wheat pasta.   Protein power - one-quarter of your plate: Fish, chicken, beans, and nuts are all healthy, versatile protein sources. Limit red meat.   The diet, however, does go beyond the plate, offering a few other suggestions.   Use healthy plant oils, such as olive, canola, soy, corn, sunflower and peanut. Check the labels, and avoid partially hydrogenated oil, which have unhealthy trans fats.   If youre thirsty, drink water. Coffee and tea are good in moderation, but skip sugary drinks and limit milk and dairy products to one or two daily servings.   The type of carbohydrate in the diet is more important than the amount. Some sources of carbohydrates, such as vegetables, fruits, whole grains, and beans-are healthier than others.   Finally, stay active  Signed, Berniece Salines, DO  07/10/2022 8:38 PM    [facility]

## 2022-07-10 NOTE — Patient Instructions (Addendum)
Medication Instructions:  Your physician has recommended you make the following change in your medication:  START: Propranolol 20 mg twice daily *If you need a refill on your cardiac medications before your next appointment, please call your pharmacy*   Lab Work: None   Testing/Procedures: None   Follow-Up: At [facility], you and your health needs are our priority.  As part of our continuing mission to provide you with exceptional heart care, we have created designated Provider Care Teams.  These Care Teams include your primary Cardiologist (physician) and Advanced Practice Providers (APPs -  Physician Assistants and Nurse Practitioners) who all work together to provide you with the care you need, when you need it.  Your next appointment:   6 month(s)  Provider:   Berniece Salines, DO    Other instructions: Please see pharm-D

## 2022-07-28 ENCOUNTER — Ambulatory Visit: Payer: Medicaid Other | Admitting: Cardiology

## 2022-09-15 ENCOUNTER — Ambulatory Visit: Payer: Medicaid Other

## 2022-10-09 NOTE — Progress Notes (Signed)
Called at 1:32 N/A LVM  Will try to reach out to her again in 10-15 min  Called at 1:48  HPI: Veronica Boyle is a 32 y.o. female patient referred to pharmacy clinic by Dr. Servando Salina to initiate weight loss therapy with GLP1-RA.  Most recent BMI ***, 352 lbs. PMH significant for   Stop medication x 7 days for surgical procedures requiring anesthesia  Significant medical history:                  *** If diabetic and on insulin/sulfonylurea, can consider reducing dose to reduce risk of hypoglycemia  *** Follow-up visit  Assess % weight loss Assess adverse effects Missed doses  Current weight management medications:   Previously tried meds:   Current meds that may affect weight:   Baseline weight/BMI:   Insurance payor:   Diet:  -Breakfast: -Lunch: -Dinner: -Snacks: -Drinks:  {diet history for weight loss:28257}  Exercise:  {types:28256}  Family History:   Confirmed patient not ***pregnant and no personal or family history of medullary thyroid carcinoma (MTC) or Multiple Endocrine Neoplasia syndrome type 2 (MEN 2).   Social History:   Labs: Lab Results  Component Value Date   HGBA1C 5.6 08/07/2019    Wt Readings from Last 1 Encounters:  07/10/22 (!) 352 lb 3.2 oz (159.8 kg)    BP Readings from Last 1 Encounters:  07/10/22 132/78   Pulse Readings from Last 1 Encounters:  07/10/22 81       Component Value Date/Time   CHOL 159 01/19/2019 1157   TRIG 255 (H) 01/19/2019 1157   HDL 33 (L) 01/19/2019 1157   CHOLHDL 4.8 (H) 01/19/2019 1157   LDLCALC 84 01/19/2019 1157    Past Medical History:  Diagnosis Date   Anxiety 11/15/2014   Arrhythmia    Atypical chest pain 11/15/2014   [facility] and [facility] done.    BMI 50.0-59.9, adult ([facility]) 08/07/2019   Gestational hypertension    History of fetal abnormality in previous pregnancy, currently pregnant 08/16/2013   No issues with 2nd baby See 08/24/2013 genetics note  States first baby had "two  uteruses and 3 kidneys".  Delivered at [facility]. C/S for FTD and NRFHR. Takes baby to [facility] regularly   History of gestational hypertension 07/31/2019   Hypertension 03/25/2020   Low vitamin D level 09/14/2019   Metabolic syndrome 03/25/2020   Morbid obesity ([facility]) 03/25/2020   Palpitations 11/15/2014   Paroxysmal supraventricular tachycardia 11/15/2014   Seen by [facility] 11/2019. Normal echo, EF 10/2019. No e/o SVT on holter. RTC 3months. 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 1 each 0   cetirizine (ZYRTEC) 10 MG tablet Take 10 mg by mouth daily.     propranolol (INDERAL) 20 MG tablet Take 1 tablet (20 mg total) by mouth 2 (two) times daily. 180 tablet 3   No current facility-administered medications on file prior to visit.    Allergies  Allergen Reactions   Vancomycin Other (See Comments)    Reaction:  Red man syndrome    Assessment/Plan   No  problem-specific Assessment & Plan notes found for this encounter.  [email or web address] @  Carmela Hurt, [address]D [facility] A Division of [facility] [address], [place], [place] [address]  Phone: [phone]; Fax: [phone]

## 2022-10-12 ENCOUNTER — Ambulatory Visit: Payer: Medicaid Other | Attending: Cardiology | Admitting: Student

## 2022-10-12 ENCOUNTER — Telehealth: Payer: Self-pay | Admitting: Pharmacist

## 2022-10-12 NOTE — Telephone Encounter (Signed)
Patient was scheduled for phone visit for GLP1/weight loss with PharmD today at 1:30. Called  twice N/A LVM. Reschedule appointment if she is still interested.

## 2023-01-07 ENCOUNTER — Ambulatory Visit: Payer: Medicaid Other | Attending: Cardiology | Admitting: Cardiology
